# Patient Record
Sex: Male | Born: 1935 | Race: White | Hispanic: No | Smoking: Never smoker
Health system: Southern US, Community
[De-identification: ages and names within clinical notes are randomized; demographics above are authoritative.]

## PROBLEM LIST (undated history)

## (undated) DIAGNOSIS — R739 Hyperglycemia, unspecified: Secondary | ICD-10-CM

## (undated) DIAGNOSIS — J40 Bronchitis, not specified as acute or chronic: Secondary | ICD-10-CM

## (undated) DIAGNOSIS — J189 Pneumonia, unspecified organism: Secondary | ICD-10-CM

## (undated) DIAGNOSIS — N400 Enlarged prostate without lower urinary tract symptoms: Secondary | ICD-10-CM

## (undated) DIAGNOSIS — C4491 Basal cell carcinoma of skin, unspecified: Secondary | ICD-10-CM

## (undated) DIAGNOSIS — M1990 Unspecified osteoarthritis, unspecified site: Secondary | ICD-10-CM

## (undated) DIAGNOSIS — E785 Hyperlipidemia, unspecified: Secondary | ICD-10-CM

## (undated) DIAGNOSIS — I10 Essential (primary) hypertension: Secondary | ICD-10-CM

## (undated) DIAGNOSIS — R000 Tachycardia, unspecified: Secondary | ICD-10-CM

## (undated) HISTORY — DX: Basal cell carcinoma of skin, unspecified: C44.91

## (undated) HISTORY — DX: Hyperglycemia, unspecified: R73.9

## (undated) HISTORY — PX: BASAL CELL CARCINOMA EXCISION: SHX1214

## (undated) HISTORY — DX: Pneumonia, unspecified organism: J18.9

## (undated) HISTORY — DX: Hyperlipidemia, unspecified: E78.5

## (undated) HISTORY — PX: OTHER SURGICAL HISTORY: SHX169

## (undated) HISTORY — DX: Benign prostatic hyperplasia without lower urinary tract symptoms: N40.0

---

## 1970-02-19 HISTORY — PX: HERNIA REPAIR: SHX51

## 2001-02-19 HISTORY — PX: COLONOSCOPY: SHX174

## 2004-06-20 ENCOUNTER — Ambulatory Visit: Payer: Self-pay | Admitting: Internal Medicine

## 2004-07-24 ENCOUNTER — Ambulatory Visit: Payer: Self-pay | Admitting: Internal Medicine

## 2004-08-08 ENCOUNTER — Ambulatory Visit: Payer: Self-pay

## 2004-10-24 ENCOUNTER — Ambulatory Visit: Payer: Self-pay | Admitting: Internal Medicine

## 2004-11-01 ENCOUNTER — Ambulatory Visit: Payer: Self-pay | Admitting: Internal Medicine

## 2005-04-30 ENCOUNTER — Ambulatory Visit: Payer: Self-pay | Admitting: Internal Medicine

## 2005-06-18 ENCOUNTER — Ambulatory Visit: Payer: Self-pay | Admitting: Internal Medicine

## 2005-10-02 ENCOUNTER — Ambulatory Visit: Payer: Self-pay | Admitting: Internal Medicine

## 2005-10-23 ENCOUNTER — Ambulatory Visit: Payer: Self-pay | Admitting: Internal Medicine

## 2005-11-08 ENCOUNTER — Ambulatory Visit: Payer: Self-pay | Admitting: Internal Medicine

## 2006-06-26 DIAGNOSIS — E785 Hyperlipidemia, unspecified: Secondary | ICD-10-CM

## 2006-06-26 DIAGNOSIS — N400 Enlarged prostate without lower urinary tract symptoms: Secondary | ICD-10-CM

## 2006-07-17 ENCOUNTER — Encounter: Payer: Self-pay | Admitting: Internal Medicine

## 2006-07-17 ENCOUNTER — Ambulatory Visit: Payer: Self-pay | Admitting: Internal Medicine

## 2006-08-27 ENCOUNTER — Ambulatory Visit: Payer: Self-pay | Admitting: Internal Medicine

## 2006-09-03 ENCOUNTER — Encounter (INDEPENDENT_AMBULATORY_CARE_PROVIDER_SITE_OTHER): Payer: Self-pay | Admitting: *Deleted

## 2006-09-03 LAB — CONVERTED CEMR LAB
ALT: 24 units/L (ref 0–53)
Cholesterol: 195 mg/dL (ref 0–200)
LDL Cholesterol: 124 mg/dL — ABNORMAL HIGH (ref 0–99)
Potassium: 4.8 meq/L (ref 3.5–5.1)
Total CHOL/HDL Ratio: 5.4
VLDL: 34 mg/dL (ref 0–40)

## 2006-12-05 ENCOUNTER — Ambulatory Visit: Payer: Self-pay | Admitting: Internal Medicine

## 2006-12-16 ENCOUNTER — Ambulatory Visit: Payer: Self-pay | Admitting: Internal Medicine

## 2007-02-20 DIAGNOSIS — J189 Pneumonia, unspecified organism: Secondary | ICD-10-CM

## 2007-02-20 HISTORY — DX: Pneumonia, unspecified organism: J18.9

## 2007-04-16 ENCOUNTER — Encounter: Payer: Self-pay | Admitting: Internal Medicine

## 2007-08-20 ENCOUNTER — Ambulatory Visit: Payer: Self-pay | Admitting: Internal Medicine

## 2007-08-20 DIAGNOSIS — T887XXA Unspecified adverse effect of drug or medicament, initial encounter: Secondary | ICD-10-CM | POA: Insufficient documentation

## 2008-04-01 ENCOUNTER — Ambulatory Visit: Payer: Self-pay | Admitting: Internal Medicine

## 2008-04-01 DIAGNOSIS — R739 Hyperglycemia, unspecified: Secondary | ICD-10-CM

## 2008-04-01 DIAGNOSIS — I351 Nonrheumatic aortic (valve) insufficiency: Secondary | ICD-10-CM

## 2008-04-01 DIAGNOSIS — I10 Essential (primary) hypertension: Secondary | ICD-10-CM | POA: Insufficient documentation

## 2008-04-01 DIAGNOSIS — Z85828 Personal history of other malignant neoplasm of skin: Secondary | ICD-10-CM

## 2008-04-01 LAB — CONVERTED CEMR LAB
HDL goal, serum: 40 mg/dL
LDL Goal: 100 mg/dL

## 2008-04-02 ENCOUNTER — Encounter: Payer: Self-pay | Admitting: Internal Medicine

## 2008-04-12 ENCOUNTER — Ambulatory Visit: Payer: Self-pay | Admitting: Internal Medicine

## 2008-04-25 LAB — CONVERTED CEMR LAB
Albumin: 4.1 g/dL (ref 3.5–5.2)
BUN: 18 mg/dL (ref 6–23)
Basophils Relative: 0.4 % (ref 0.0–3.0)
Cholesterol: 148 mg/dL (ref 0–200)
Eosinophils Absolute: 1.3 10*3/uL — ABNORMAL HIGH (ref 0.0–0.7)
Eosinophils Relative: 16.8 % — ABNORMAL HIGH (ref 0.0–5.0)
HCT: 43.9 % (ref 39.0–52.0)
HDL: 35.8 mg/dL — ABNORMAL LOW (ref >39.0)
Hgb A1c MFr Bld: 5.8 % (ref 4.6–6.0)
MCHC: 35.4 g/dL (ref 30.0–36.0)
MCV: 90.9 fL (ref 78.0–100.0)
Monocytes Absolute: 0.6 10*3/uL (ref 0.1–1.0)
Platelets: 232 10*3/uL (ref 150–400)
Potassium: 5.3 meq/L — ABNORMAL HIGH (ref 3.5–5.1)
RBC: 4.83 M/uL (ref 4.22–5.81)
Total Protein: 7.1 g/dL (ref 6.0–8.3)
WBC: 7.6 10*3/uL (ref 4.5–10.5)

## 2008-04-27 ENCOUNTER — Telehealth (INDEPENDENT_AMBULATORY_CARE_PROVIDER_SITE_OTHER): Payer: Self-pay | Admitting: *Deleted

## 2008-04-27 ENCOUNTER — Encounter (INDEPENDENT_AMBULATORY_CARE_PROVIDER_SITE_OTHER): Payer: Self-pay | Admitting: *Deleted

## 2008-06-23 ENCOUNTER — Ambulatory Visit: Payer: Self-pay | Admitting: Internal Medicine

## 2009-03-15 ENCOUNTER — Ambulatory Visit: Payer: Self-pay | Admitting: Internal Medicine

## 2009-03-29 ENCOUNTER — Ambulatory Visit: Payer: Self-pay | Admitting: Internal Medicine

## 2009-04-04 LAB — CONVERTED CEMR LAB
ALT: 35 units/L (ref 0–53)
AST: 32 units/L (ref 0–37)
Alkaline Phosphatase: 55 units/L (ref 39–117)
Bilirubin, Direct: 0.1 mg/dL (ref 0.0–0.3)
HDL: 42.1 mg/dL (ref >39.00)
TSH: 0.72 microintl units/mL (ref 0.35–5.50)
Total CHOL/HDL Ratio: 4
Total Protein: 7 g/dL (ref 6.0–8.3)

## 2009-04-08 ENCOUNTER — Emergency Department (HOSPITAL_COMMUNITY): Admission: EM | Admit: 2009-04-08 | Discharge: 2009-04-08 | Payer: Self-pay | Admitting: Family Medicine

## 2009-06-07 ENCOUNTER — Ambulatory Visit (HOSPITAL_BASED_OUTPATIENT_CLINIC_OR_DEPARTMENT_OTHER): Admission: RE | Admit: 2009-06-07 | Discharge: 2009-06-07 | Payer: Self-pay | Admitting: Surgery

## 2009-07-20 ENCOUNTER — Ambulatory Visit: Payer: Self-pay | Admitting: Internal Medicine

## 2009-10-18 ENCOUNTER — Ambulatory Visit: Payer: Self-pay | Admitting: Internal Medicine

## 2009-10-18 DIAGNOSIS — J069 Acute upper respiratory infection, unspecified: Secondary | ICD-10-CM | POA: Insufficient documentation

## 2010-03-22 NOTE — Assessment & Plan Note (Signed)
Summary: for med refill//ph   Vital Signs:  Patient profile:   77 year old male Height:      67 inches Weight:      180 pounds Temp:     97.9 degrees F oral Pulse rate:   86 / minute Resp:     18 per minute BP sitting:   120 / 78  (left arm)  Vitals Entered By: Jeremy Johann CMA (March 15, 2009 1:02 PM) CC: f/u bp meds, Hypertension Management, Lipid Management Comments REVIEWED MED LIST, PATIENT AGREED DOSE AND INSTRUCTION CORRECT    CC:  f/u bp meds, Hypertension Management, and Lipid Management.  History of Present Illness: BP @ home 120/75 on average . Rare  flushing with Niaspan , once every 3-4 months , relieved by Benadryl. 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 Some DOE due to deconditioning but not severe. Lipid Management reviewed; HDL never above 42 & it has been as low as 28.  Hypertension History:      He complains of peripheral edema, but denies headache, chest pain, palpitations, dyspnea with exertion, orthopnea, PND, visual symptoms, neurologic problems, syncope, and side effects from treatment.  He notes no problems with any antihypertensive medication side effects.  Occasional RLE edema.        Positive major cardiovascular risk factors include male age 67 years old or older, hyperlipidemia, hypertension, and family history for ischemic heart disease (males less than 75 years old).  Negative major cardiovascular risk factors include no history of diabetes and non-tobacco-user status.        Further assessment for target organ damage reveals no history of ASHD, stroke/TIA, or peripheral vascular disease.    Lipid Management History:      Positive NCEP/ATP III risk factors include male age 69 years old or older, HDL cholesterol less than 40, family history for ischemic heart disease (males less than 13 years old), and hypertension.  Negative NCEP/ATP III risk factors include non-diabetic, non-tobacco-user  status, no ASHD (atherosclerotic heart disease), no prior stroke/TIA, no peripheral vascular disease, and no history of aortic aneurysm.      Allergies (verified): No Known Drug Allergies  Past History:  Past Medical History: chest pain 1992, neg cath ; Aortic Insufficiency; Hyperglycemia Hyperlipidemia Benign prostatic hypertrophy Skin cancer, hx of X 2(face & hand), Dr Margo Aye  Past Surgical History: hydrocoelectomy;colonoscopy negative; local resection of skin cancers as OP ; Inguinal herniorrhaphy  Family History: Father: MI @ 50,HTN,benign essential tremor, AAA rupture ? Mother: HTN,AF Siblings: bro CHF,alcohol abuse  Social History: Retired Never Smoked Alcohol use-no Married  Review of Systems General:  Denies fatigue and weight loss. Eyes:  Denies blurring, double vision, and vision loss-both eyes. ENT:  Denies difficulty swallowing and hoarseness. CV:  Denies leg cramps with exertion, lightheadness, and near fainting. Resp:  Denies cough, shortness of breath, sputum productive, and wheezing. GI:  Denies abdominal pain, bloody stools, dark tarry stools, and indigestion. GU:  Denies discharge, dysuria, and hematuria. MS:  Denies joint pain, joint redness, joint swelling, low back pain, mid back pain, and thoracic pain. Derm:  Denies lesion(s) and rash. Neuro:  Denies brief paralysis, numbness, tingling, and weakness. Endo:  Denies cold intolerance, excessive hunger, excessive thirst, excessive urination, and heat intolerance. Heme:  Denies abnormal bruising and bleeding.  Physical Exam  General:  Appears younger than age,well-nourished,in no acute distress; alert,appropriate and cooperative throughout examination Neck:  No deformities, masses, or tenderness  noted. Lungs:  Normal respiratory effort, chest expands symmetrically. Lungs are clear to auscultation, no crackles or wheezes. Heart:  normal rate, regular rhythm, no gallop, no rub, no JVD, no HJR, and grade   1/6 systolic murmur @ R base.   Abdomen:  Bowel sounds positive,abdomen soft and non-tender without masses, organomegaly or hernias noted. No AAA or bruits Prostate:  Deferred due to age Msk:  No deformity or scoliosis noted of thoracic or lumbar spine.   Pulses:  R and L carotid,radial,dorsalis pedis and posterior tibial pulses are full and equal bilaterally Extremities:  No clubbing, cyanosis, edema. Neurologic:  alert & oriented X3 and DTRs symmetrical and normal.   Skin:  Intact without suspicious lesions or rashes Cervical Nodes:  No lymphadenopathy noted Axillary Nodes:  No palpable lymphadenopathy Psych:  memory intact for recent and remote, normally interactive, and good eye contact.     Impression & Recommendations:  Problem # 1:  HYPERTENSION, ESSENTIAL NOS (ICD-401.9) Controlled His updated medication list for this problem includes:    Lotrel 10-20 Mg Caps (Amlodipine besy-benazepril hcl) .Marland Kitchen... 1 by mouth qd    Propranolol Hcl 40 Mg Tabs (Propranolol hcl) .Marland Kitchen... 1/2 tab bid  Problem # 2:  HYPERLIPIDEMIA (ICD-272.4)  His updated medication list for this problem includes:    Niaspan 1000 Mg Tbcr (Niacin (antihyperlipidemic)) .Marland Kitchen... 1 by mouth qd  Problem # 3:  FASTING HYPERGLYCEMIA (ICD-790.29)  Problem # 4:  SKIN CANCER, HX OF (ICD-V10.83) as per Dr Margo Aye  Problem # 5:  AORTIC INSUFFICIENCY, MILD (ICD-424.1) Clinically stable His updated medication list for this problem includes:    Propranolol Hcl 40 Mg Tabs (Propranolol hcl) .Marland Kitchen... 1/2 tab bid  Complete Medication List: 1)  Niaspan 1000 Mg Tbcr (Niacin (antihyperlipidemic)) .Marland Kitchen.. 1 by mouth qd 2)  Lotrel 10-20 Mg Caps (Amlodipine besy-benazepril hcl) .Marland Kitchen.. 1 by mouth qd 3)  Propranolol Hcl 40 Mg Tabs (Propranolol hcl) .... 1/2 tab bid 4)  Advair Diskus 100-50 Mcg/dose Misc (Fluticasone-salmeterol) .Marland Kitchen.. 1 puff q12 hours  Hypertension Assessment/Plan:      The patient's hypertensive risk group is category B: At least  one risk factor (excluding diabetes) with no target organ damage.  His calculated 10 year risk of coronary heart disease is 11 %.  Today's blood pressure is 120/78.  His blood pressure goal is < 140/90.  Lipid Assessment/Plan:      Based on NCEP/ATP III, the patient's risk factor category is "2 or more risk factors and a calculated 10 year CAD risk of < 20%".  The patient's lipid goals are as follows: Total cholesterol goal is 200; LDL cholesterol goal is 100; HDL cholesterol goal is 40; Triglyceride goal is 150.  His LDL cholesterol goal has been met.  Secondary causes for hyperlipidemia have been ruled out.  He has been counseled on adjunctive measures for lowering his cholesterol and has been provided with dietary instructions.    Patient Instructions: 1)  Please schedule fasting labs: 2)  BUN,creat, K+ prior to visit, ICD-9:401.9 3)  Hepatic Panel prior to visit, ICD-9:995.20 4)  Lipid Panel prior to visit, ICD-9:272.4 5)  TSH prior to visit, ICD-9:272.4 6)  HbgA1C prior to visit, ICD-9:790.29,272.4 Prescriptions: PROPRANOLOL HCL 40 MG  TABS (PROPRANOLOL HCL) 1/2 tab bid  #90 x 3   Entered and Authorized by:   Marga Melnick MD   Signed by:   Marga Melnick MD on 03/15/2009   Method used:   Printed then faxed to .[facility]land KitchenMar[facility] Kitchen  [facility]* (mail-order)             ,          Ph: [phone]       Fax: [phone]   RxID:   [id number] LOTREL 10-20 MG  CAPS (AMLODIPINE BESY-BENAZEPRIL HCL) 1 by mouth qd  #90 Each x 3   Entered and Authorized by:   Marga Melnick MD   Signed by:   Marga Melnick MD on 03/15/2009   Method used:   Printed then faxed to ...       [facility]* (mail-order)             ,          Ph: [phone]       Fax: [phone]   RxID:   [id number] NIASPAN 1000 MG TBCR (NIACIN (ANTIHYPERLIPIDEMIC)) 1 by mouth qd  #90 x 3   Entered and Authorized by:   Marga Melnick MD   Signed by:   Marga Melnick MD on 03/15/2009   Method used:   Printed then  faxed to ...       [facility]* (mail-order)             ,          Ph: [phone]       Fax: [phone]   RxID:   [id number]

## 2010-03-22 NOTE — Assessment & Plan Note (Signed)
Summary: cough/cbs   Vital Signs:  Patient profile:   77 year old male Weight:      177.8 pounds BMI:     27.95 Temp:     98.3 degrees F oral Pulse rate:   76 / minute Resp:     17 per minute BP sitting:   138 / 80  (left arm) Cuff size:   large  Vitals Entered By: Shonna Chock (July 20, 2009 4:32 PM) CC: Cough since Saturday Comments REVIEWED MED LIST, PATIENT AGREED DOSE AND INSTRUCTION CORRECT    CC:  Cough since Saturday.  History of Present Illness: Onset 07/15/2009 as NP cough followed by  mucus, initially clear but dry gray as of 05/31.His 2 daughters, granddaughter & wife have had similar illness. FA:OZHYQM, OTC allergy drug.  Allergies (verified): No Known Drug Allergies  Review of Systems General:  Denies chills, fever, and sweats. ENT:  Complains of nasal congestion; denies ear discharge, earache, sinus pressure, and sore throat; Minor frontal headache; no purulence or facial pain. Resp:  Complains of cough and sputum productive; denies shortness of breath and wheezing. Allergy:  Complains of sneezing; denies itching eyes.  Physical Exam  General:  in no acute distress; alert,appropriate and cooperative throughout examination Ears:  External ear exam shows no significant lesions or deformities.  Otoscopic examination reveals clear canals, tympanic membranes are intact bilaterally without bulging, retraction, inflammation or discharge. Hearing is grossly normal bilaterally. Nose:  External nasal examination shows no deformity or inflammation. Nasal mucosa are pink and moist without lesions or exudates. Mouth:  Oral mucosa and oropharynx without lesions or exudates.  Upper plate; lower partial Lungs:  Normal respiratory effort, chest expands symmetrically. Lungs are clear to auscultation, no crackles or wheezes. BS decreased  Heart:  normal rate, regular rhythm, no gallop, no rub, no JVD, and grade 1 /6 systolic murmur.  S4 Cervical Nodes:  No lymphadenopathy  noted Axillary Nodes:  No palpable lymphadenopathy   Impression & Recommendations:  Problem # 1:  BRONCHITIS-ACUTE (ICD-466.0)  His updated medication list for this problem includes:    Advair Diskus 100-50 Mcg/dose Misc (Fluticasone-salmeterol) .Marland Kitchen... 1 puff q12 hours as needed    Benzonatate 100 Mg Caps (Benzonatate) .Marland Kitchen... 1 every 8 hrs as needed cough    Azithromycin 250 Mg Tabs (Azithromycin) .Marland Kitchen... As per pack  Orders: Prescription Created Electronically [phone])  Complete Medication List: 1)  Niaspan 1000 Mg Tbcr (Niacin (antihyperlipidemic)) .Marland Kitchen.. 1 by mouth qd 2)  Lotrel 10-20 Mg Caps (Amlodipine besy-benazepril hcl) .Marland Kitchen.. 1 by mouth qd 3)  Propranolol Hcl 40 Mg Tabs (Propranolol hcl) .... 1/2 tab bid 4)  Advair Diskus 100-50 Mcg/dose Misc (Fluticasone-salmeterol) .Marland Kitchen.. 1 puff q12 hours as needed 5)  Benzonatate 100 Mg Caps (Benzonatate) .Marland Kitchen.. 1 every 8 hrs as needed cough 6)  Azithromycin 250 Mg Tabs (Azithromycin) .... As per pack  Patient Instructions: 1)  Drink as much fluid as you can tolerate for the next few days. Prescriptions: ADVAIR DISKUS 100-50 MCG/DOSE  MISC (FLUTICASONE-SALMETEROL) 1 puff q12 hours as needed  #1 x 5   Entered and Authorized by:   Marga Melnick MD   Signed by:   Marga Melnick MD on 07/20/2009   Method used:   Faxed to ...       [facility]  [address]  [phone]* (retail)       [address]       [place], [place]  [address]  Ph: [phone] or [phone]       Fax: [phone]   RxID:   [id number] AZITHROMYCIN 250 MG TABS (AZITHROMYCIN) as per pack  #1 x 0   Entered and Authorized by:   Marga Melnick MD   Signed by:   Marga Melnick MD on 07/20/2009   Method used:   Faxed to ...       [facility]  [address]  [phone]* (retail)       [address]       [place], [place]  [address]       Ph: [phone] or [phone]       Fax: [phone]   RxID:   [id number] BENZONATATE  100 MG CAPS (BENZONATATE) 1 every 8 hrs as needed cough  #15 x 0   Entered and Authorized by:   Marga Melnick MD   Signed by:   Marga Melnick MD on 07/20/2009   Method used:   Faxed to ...       [facility]  [address]  [phone]* (retail)       [address]       [place], [place]  [address]       Ph: [phone] or [phone]       Fax: [phone]   RxID:   [id number]

## 2010-03-22 NOTE — Assessment & Plan Note (Signed)
Summary: COUGH/CONGESTED/KN   Vital Signs:  Patient profile:   77 year old male Weight:      176.8 pounds Temp:     98.4 degrees F oral Pulse rate:   56 / minute Resp:     17 per minute BP sitting:   110 / 70  (left arm) Cuff size:   large  Vitals Entered By: Shonna Chock CMA (October 18, 2009 12:17 PM) CC: Cough and congestion since Saturday, URI symptoms   CC:  Cough and congestion since Saturday and URI symptoms.  History of Present Illness:  URI Symptoms      This is a 77 year old man who presents with URI symptoms since 10/14/2009 .  The patient reports nasal congestion and productive cough with traces of dark  sputum, but denies purulent nasal discharge, sore throat, and earache.  Associated symptoms include ? nocturnal  wheezing according to his wife.  The patient denies fever and dyspnea.  The patient also reports frontal  headache.  Risk factors for Strep sinusitis include facial pain.  The patient denies the following risk factors for Strep sinusitis: tooth pain and tender adenopathy.  Rx: Mucinex , Actifed, Neti pot  Current Medications (verified): 1)  Niaspan 1000 Mg Tbcr (Niacin (Antihyperlipidemic)) .Marland Kitchen.. 1 By Mouth Qd 2)  Lotrel 10-20 Mg  Caps (Amlodipine Besy-Benazepril Hcl) .Marland Kitchen.. 1 By Mouth Qd 3)  Propranolol Hcl 40 Mg  Tabs (Propranolol Hcl) .... 1/2 Tab Bid 4)  Advair Diskus 100-50 Mcg/dose  Misc (Fluticasone-Salmeterol) .Marland Kitchen.. 1 Puff Q12 Hours As Needed  Allergies (verified): No Known Drug Allergies  Physical Exam  General:  in no acute distress; alert,appropriate and cooperative throughout examination Eyes:  No corneal or conjunctival inflammation noted. Ears:  External ear exam shows no significant lesions or deformities.  Otoscopic examination reveals clear canals, tympanic membranes are intact bilaterally without bulging, retraction, inflammation or discharge. Hearing is grossly normal bilaterally. Nose:  External nasal examination shows no deformity or  inflammation. Nasal mucosa are pink and moist without lesions or exudates. Mouth:  Oral mucosa and oropharynx without lesions or exudates.  Upper plate & lower partial Lungs:  Normal respiratory effort, chest expands symmetrically. Lungs are clear to auscultation, no crackles or  BS decreased w/o increased WOB Heart:  Normal rate and regular rhythm. S1 and S2 normal without gallop, murmur, click, rub or other extra sounds. Cervical Nodes:  No lymphadenopathy noted Axillary Nodes:  No palpable lymphadenopathy   Impression & Recommendations:  Problem # 1:  URI (ICD-465.9)  The following medications were removed from the medication list:    Benzonatate 100 Mg Caps (Benzonatate) .Marland Kitchen... 1 every 8 hrs as needed cough  Orders: Prescription Created Electronically [phone])  Problem # 2:  BRONCHITIS-ACUTE (ICD-466.0) Assessment: Unchanged  The following medications were removed from the medication list:    Benzonatate 100 Mg Caps (Benzonatate) .Marland Kitchen... 1 every 8 hrs as needed cough    Azithromycin 250 Mg Tabs (Azithromycin) .Marland Kitchen... As per pack His updated medication list for this problem includes:    Advair Diskus 100-50 Mcg/dose Misc (Fluticasone-salmeterol) .Marland Kitchen... 1 puff q12 hours as needed    Amoxicillin 500 Mg Caps (Amoxicillin) .Marland Kitchen... 1 three times a day  Orders: Prescription Created Electronically [phone])  Complete Medication List: 1)  Niaspan 1000 Mg Tbcr (Niacin (antihyperlipidemic)) .Marland Kitchen.. 1 by mouth qd 2)  Lotrel 10-20 Mg Caps (Amlodipine besy-benazepril hcl) .Marland Kitchen.. 1 by mouth qd 3)  Propranolol Hcl 40 Mg Tabs (Propranolol hcl) .... 1/2 tab bid  4)  Advair Diskus 100-50 Mcg/dose Misc (Fluticasone-salmeterol) .Marland Kitchen.. 1 puff q12 hours as needed 5)  Amoxicillin 500 Mg Caps (Amoxicillin) .Marland Kitchen.. 1 three times a day  Patient Instructions: 1)  Stop Actifed.Drink as much fluid as you can tolerate for the next few days. Prescriptions: AMOXICILLIN 500 MG CAPS (AMOXICILLIN) 1 three times a day  #30 x 0    Entered and Authorized by:   Marga Melnick MD   Signed by:   Marga Melnick MD on 10/18/2009   Method used:   Faxed to ...       [facility]  [address]  [phone]* (retail)       [address]       [place], [place]  [address]       Ph: [phone] or [phone]       Fax: [phone]   RxID:   [id number]

## 2010-04-28 ENCOUNTER — Encounter (INDEPENDENT_AMBULATORY_CARE_PROVIDER_SITE_OTHER): Payer: Self-pay | Admitting: *Deleted

## 2010-05-02 NOTE — Letter (Signed)
Summary: Primary Care Appointment Letter  [facility]  [address] [place], [place] [address]   Phone: [phone]  Fax: [phone]    04/28/2010 MRN: [id number]  Christopher Richards [address] [place], [place]  [address]  Dear [name],   Your Primary Care Physician Marga Melnick MD has indicated that:    ___X____it is time to schedule an 30 min appointment for Phyical and Fasting labs before any further refills will be given.    _______you missed your appointment on______ and need to call and          reschedule.    _______you need to have lab work done.    _______you need to schedule an appointment discuss lab or test results.    _______you need to call to reschedule your appointment that is                       scheduled on _________.     Please call our office as soon as possible. Our phone number is [phone]____. Our office is open 8a-5p, Monday through Friday.     Thank you,    [facility]

## 2010-07-22 ENCOUNTER — Other Ambulatory Visit: Payer: Self-pay | Admitting: Internal Medicine

## 2010-08-31 ENCOUNTER — Ambulatory Visit (INDEPENDENT_AMBULATORY_CARE_PROVIDER_SITE_OTHER): Payer: Medicare Other | Admitting: Internal Medicine

## 2010-08-31 ENCOUNTER — Encounter: Payer: Self-pay | Admitting: Internal Medicine

## 2010-08-31 VITALS — BP 130/76 | HR 76 | Temp 98.0°F | Wt 179.6 lb

## 2010-08-31 DIAGNOSIS — M25512 Pain in left shoulder: Secondary | ICD-10-CM

## 2010-08-31 DIAGNOSIS — M25519 Pain in unspecified shoulder: Secondary | ICD-10-CM

## 2010-08-31 MED ORDER — TRAMADOL HCL 50 MG PO TABS: 50.0000 mg | ORAL_TABLET | Freq: Four times a day (QID) | ORAL | Status: AC | PRN

## 2010-08-31 NOTE — Progress Notes (Signed)
Subjective:    Patient ID: Christopher Richards, male    DOB: 09/11/1927, 77 y.o.   MRN: [id number]  HPI SHOULDER  PAIN: Location: L   Onset: 7/5. 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 She's had a severe case of shingles and infracture of her foot. He has had  to provide physical support intermittently.   Severity: up to 8 Pain is described as: sharp  Worse with: with ROM    Better with: no relievers ; BenGay topically and nonsteroidals do not help. Pain radiates to: mid upper arm   Impaired range of motion: decreased elevation & medial rotation History of repetitive motion:  no  History of overuse or hyperextension:  no  History of trauma:  no   Past history of similar problem:  yes, with dislocations in his 33s Symptoms Back Pain:  no  Numbness/tingling:  no  Weakness:  no  Red Flags Fever:  no  Headache/Neck pain :  no  Bowel/bladder dysfunction:  no  In the 1970s he had steroid injections to the left shoulder for bursitis X 2. He also had oral steroids for a week at a time on at least 2 occasions in the 1970s. 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 Alert, appropriate and cooperative throughout exam but uncomfortable. Head: Normocephalic without obvious abnormalities Eyes: No corneal or conjunctival inflammation noted. Pupils equal round reactive to light and accommodation.  Extraocular motion intact.  Neck: No deformities, masses, or tenderness noted. Range of motion is normal, but he has pain with lateral rotation of the neck to the left Thyroid  normal. Lungs: Normal respiratory effort; chest expands symmetrically. Lungs are clear to auscultation without rales, wheezes, or increased work of breathing. Breath sounds are decreased Heart: Normal rate and rhythm. Normal S1 and S2. No gallop, click, or rub. S4 with  slurring; no significant  no murmur.                                                                           Musculoskeletal/extremities: No deformity or scoliosis noted of  the thoracic or lumbar spine; there is asymmetry of the thoracic musculature, the right is more developed on the left.. No clubbing, cyanosis, edema, or deformity noted. Range of motion  : Markedly reduced in the left upper extremity at the shoulder. He has obvious pain with elevation and  medial rotation/abduction and also extension posteriorly. Crepitus is present in the left shoulder .Tone & strength  normal.Joints normal. Nail health  good. Vascular: Carotid, radial artery pulses are full and equal. No bruits present. Neurologic: Alert and oriented x3. Deep tendon reflexes symmetrical and normal.          Skin: Intact without suspicious lesions or rashes. He does have scattered bruising over the forearms Lymph: No cervical, axillary  lymphadenopathy present. Psych: Mood and affect are normal. Normally interactive  Assessment & Plan:  #1 shoulder pain; rule out rotator cuff tear.  Plan: #1 tramadol  every 6 hours as needed  #2 orthopedic referral

## 2010-08-31 NOTE — Patient Instructions (Signed)
Please do not lift more than 10 pounds with the left arm.  You will be called with the appointment to see the orthopedist. My concern is rotator cuff tear.

## 2010-09-15 ENCOUNTER — Ambulatory Visit (INDEPENDENT_AMBULATORY_CARE_PROVIDER_SITE_OTHER): Payer: Medicare Other | Admitting: Internal Medicine

## 2010-09-15 ENCOUNTER — Other Ambulatory Visit: Payer: Self-pay | Admitting: Internal Medicine

## 2010-09-15 VITALS — BP 124/78 | HR 82 | Temp 98.4°F | Wt 179.2 lb

## 2010-09-15 DIAGNOSIS — J209 Acute bronchitis, unspecified: Secondary | ICD-10-CM

## 2010-09-15 DIAGNOSIS — T887XXA Unspecified adverse effect of drug or medicament, initial encounter: Secondary | ICD-10-CM

## 2010-09-15 DIAGNOSIS — I10 Essential (primary) hypertension: Secondary | ICD-10-CM

## 2010-09-15 DIAGNOSIS — E785 Hyperlipidemia, unspecified: Secondary | ICD-10-CM

## 2010-09-15 MED ORDER — HYDROCODONE-HOMATROPINE 5-1.5 MG/5ML PO SYRP: 5.0000 mL | ORAL_SOLUTION | Freq: Four times a day (QID) | ORAL | Status: AC | PRN

## 2010-09-15 MED ORDER — AMLODIPINE BESY-BENAZEPRIL HCL 10-20 MG PO CAPS: 1.0000 | ORAL_CAPSULE | Freq: Every day | ORAL | Status: DC

## 2010-09-15 MED ORDER — PROPRANOLOL HCL 40 MG PO TABS: 40.0000 mg | ORAL_TABLET | Freq: Every day | ORAL | Status: DC

## 2010-09-15 MED ORDER — AZITHROMYCIN 250 MG PO TABS: ORAL_TABLET | ORAL | Status: AC

## 2010-09-15 MED ORDER — NIACIN ER (ANTIHYPERLIPIDEMIC) 1000 MG PO TBCR: 1000.0000 mg | EXTENDED_RELEASE_TABLET | Freq: Every day | ORAL | Status: DC

## 2010-09-15 NOTE — Progress Notes (Signed)
  Subjective:    Patient ID: Christopher Richards, male    DOB: 10/02/1927, 77 y.o.   MRN: [id number]  HPI Cough Onset:7 days ago Extrinsic symptoms:itchy eyes, sneezing:no  Infectious symptoms :fever, purulent secretions :yellow sputum as of 7/22 Chest symptoms:pleuritic  pain,  Hemoptysis,dyspnea,wheezing:minor wheezing @ night GI symptoms: Dyspepsia, reflux:no Occupational/environmental exposures:no Smoking:never ACE inhibitor:yes Treatment/efficacy:HBP Coricidin with some improvement in head congestion Past medical history/family history pulmonary disease: maternal FH asthma    Review of Systems he denies nasal purulence; facial pain; anosmia; fatigue;  headache; halitosis; earache and dental pain.     Objective:   Physical Exam General appearance is of good health and nourishment; no acute distress or increased work of breathing is present.  No  lymphadenopathy about the head, neck, or axilla noted.   Eyes: No conjunctival inflammation or lid edema is present. There is no scleral icterus.  Ears:  External ear exam shows no significant lesions or deformities.  Otoscopic examination reveals clear canals, tympanic membranes are intact bilaterally without bulging, retraction, inflammation or discharge. TMs slightly dull  Nose:  External nasal examination shows no deformity or inflammation. Nasal mucosa are pink and moist without lesions or exudates. No septal dislocation or dislocation.No obstruction to airflow.   Oral exam: Upper plate & lower partial; lips and gums are healthy appearing.There is no oropharyngeal erythema or exudate noted.  Hoarse  Neck:  No deformities, thyromegaly, masses, or tenderness noted.     Heart:  Normal rate and regular rhythm. S1 and S2 normal without gallop, murmur, click, rub S4 with slurring  Lungs:Chest clear to auscultation; no wheezes, rhonchi,rales ,or rubs present.No increased work of breathing. BS slightly decreased   Extremities:  No cyanosis,  edema, or clubbing  noted    Skin: Warm & dry w/o jaundice         Assessment & Plan:   #1 bronchitis, acute. No clinical reactive airways disease or rhinosinusitis at this time.  Plan: See orders and patient recommendations

## 2010-09-15 NOTE — Patient Instructions (Signed)
Plain Mucinex for thick secretions ;force NON dairy fluids for next 48 hrs. Use a Neti pot daily as needed for sinus congestion 

## 2010-09-15 NOTE — Progress Notes (Signed)
Addended by: Doristine Devoid on: 09/15/2010 11:57 AM   Modules accepted: Orders

## 2010-09-18 ENCOUNTER — Other Ambulatory Visit (INDEPENDENT_AMBULATORY_CARE_PROVIDER_SITE_OTHER): Payer: Medicare Other

## 2010-09-18 DIAGNOSIS — T887XXA Unspecified adverse effect of drug or medicament, initial encounter: Secondary | ICD-10-CM

## 2010-09-18 DIAGNOSIS — E785 Hyperlipidemia, unspecified: Secondary | ICD-10-CM

## 2010-09-18 DIAGNOSIS — I10 Essential (primary) hypertension: Secondary | ICD-10-CM

## 2010-09-18 LAB — BASIC METABOLIC PANEL
CO2: 26 mEq/L (ref 19–32)
Calcium: 9.5 mg/dL (ref 8.4–10.5)
Chloride: 105 mEq/L (ref 96–112)
Glucose, Bld: 130 mg/dL — ABNORMAL HIGH (ref 70–99)
Potassium: 4.5 mEq/L (ref 3.5–5.1)
Sodium: 141 mEq/L (ref 135–145)

## 2010-09-18 LAB — HEPATIC FUNCTION PANEL
ALT: 19 U/L (ref 0–53)
AST: 23 U/L (ref 0–37)
Alkaline Phosphatase: 57 U/L (ref 39–117)
Bilirubin, Direct: 0 mg/dL (ref 0.0–0.3)
Total Bilirubin: 0.8 mg/dL (ref 0.3–1.2)

## 2010-09-18 LAB — LIPID PANEL
HDL: 46.7 mg/dL (ref >39.00)
LDL Cholesterol: 103 mg/dL — ABNORMAL HIGH (ref 0–99)
Total CHOL/HDL Ratio: 4
Triglycerides: 71 mg/dL (ref 0.0–149.0)
VLDL: 14.2 mg/dL (ref 0.0–40.0)

## 2010-09-18 NOTE — Progress Notes (Signed)
12  

## 2010-09-18 NOTE — Progress Notes (Signed)
Labs only

## 2010-10-03 ENCOUNTER — Other Ambulatory Visit: Payer: Self-pay | Admitting: Internal Medicine

## 2010-10-03 MED ORDER — FLUTICASONE-SALMETEROL 100-50 MCG/DOSE IN AEPB: 1.0000 | INHALATION_SPRAY | Freq: Two times a day (BID) | RESPIRATORY_TRACT | Status: DC

## 2010-11-23 ENCOUNTER — Encounter: Payer: Self-pay | Admitting: Internal Medicine

## 2010-11-23 ENCOUNTER — Ambulatory Visit (INDEPENDENT_AMBULATORY_CARE_PROVIDER_SITE_OTHER): Payer: Medicare Other | Admitting: Internal Medicine

## 2010-11-23 DIAGNOSIS — J209 Acute bronchitis, unspecified: Secondary | ICD-10-CM

## 2010-11-23 DIAGNOSIS — J069 Acute upper respiratory infection, unspecified: Secondary | ICD-10-CM

## 2010-11-23 MED ORDER — SULFAMETHOXAZOLE-TMP DS 800-160 MG PO TABS: 1.0000 | ORAL_TABLET | Freq: Two times a day (BID) | ORAL | Status: AC

## 2010-11-23 NOTE — Patient Instructions (Signed)
Plain Mucinex for thick secretions ;force NON dairy fluids for next 48 hrs. Use a Neti pot daily as needed for sinus congestion 

## 2010-11-23 NOTE — Progress Notes (Signed)
  Subjective:    Patient ID: Christopher Richards, male    DOB: [birth year], 77 y.o.   MRN: [id number]  HPI Respiratory tract infection Onset/symptoms:as cough 9 days ago Exposures (illness/environmental/extrinsic):wife ill Progression of symptoms:to head& chest congestion, cough worse Treatments/response:Actifed  Present symptoms: Fever/chills/sweats:no Frontal headache:yes Facial pain:yes Nasal purulence:no Lymphadenopathy:no Wheezing/shortness of breath:only low grade wheezing Cough/sputum/hemoptysis:yellow sputum Smoking history:never PMH: no asthma           Review of Systems     Objective:   Physical Exam General appearance is of good health and nourishment; no acute distress or increased work of breathing is present.  No  lymphadenopathy about the head, neck, or axilla noted.   Eyes: No conjunctival inflammation or lid edema is present.   Ears:  External ear exam shows no significant lesions or deformities.  Otoscopic examination reveals clear canals, tympanic membranes are intact bilaterally without bulging, retraction, inflammation or discharge.L TM dull  Nose:  External nasal examination shows no deformity or inflammation. Nasal mucosa are pink and moist without lesions or exudates. No septal dislocation or dislocation.No obstruction to airflow.   Oral exam: Denture upper; lower partial.There is no oropharyngeal erythema or exudate noted.     Heart:  Normal rate and regular rhythm. S1 and S2 normal without gallop, murmur, click, rub.S4 Lungs:Chest clear to auscultation; no wheezes, rhonchi,rales ,or rubs present.No increased work of breathing.  Decreased BS  Extremities:  No cyanosis, edema, or clubbing  noted    Skin: Warm & dry       Assessment & Plan:  #1 bronchitis, acute  #2 upper respiratory infection Plan: See orders and recommendations

## 2011-04-02 ENCOUNTER — Other Ambulatory Visit: Payer: Self-pay | Admitting: Internal Medicine

## 2011-04-10 ENCOUNTER — Telehealth: Payer: Self-pay | Admitting: Internal Medicine

## 2011-04-10 MED ORDER — PROPRANOLOL HCL 40 MG PO TABS: 40.0000 mg | ORAL_TABLET | Freq: Every day | ORAL | Status: DC

## 2011-04-10 NOTE — Telephone Encounter (Signed)
Patient called stating he is in [facility] for a week and forgot to bring his propranolol. Would like 15 pills called into [facility] in [facility] Ph [phone], fax [phone].

## 2011-04-10 NOTE — Telephone Encounter (Signed)
Please refill the medication ; 1 month's worth if same coat as 15 pills

## 2011-04-10 NOTE — Telephone Encounter (Signed)
Left Pt detail message Rx sent in to pharmacy. 

## 2011-04-16 DIAGNOSIS — J209 Acute bronchitis, unspecified: Secondary | ICD-10-CM | POA: Diagnosis not present

## 2011-06-11 ENCOUNTER — Ambulatory Visit (INDEPENDENT_AMBULATORY_CARE_PROVIDER_SITE_OTHER): Payer: Medicare Other | Admitting: Internal Medicine

## 2011-06-11 ENCOUNTER — Ambulatory Visit (INDEPENDENT_AMBULATORY_CARE_PROVIDER_SITE_OTHER)
Admission: RE | Admit: 2011-06-11 | Discharge: 2011-06-11 | Disposition: A | Discharge status: Home / Self Care | Payer: Medicare Other | Source: Ambulatory Visit | Attending: Internal Medicine | Admitting: Internal Medicine

## 2011-06-11 ENCOUNTER — Encounter: Payer: Self-pay | Admitting: Internal Medicine

## 2011-06-11 VITALS — BP 120/78 | HR 73 | Temp 98.1°F | Wt 184.6 lb

## 2011-06-11 DIAGNOSIS — R609 Edema, unspecified: Secondary | ICD-10-CM | POA: Diagnosis not present

## 2011-06-11 DIAGNOSIS — I10 Essential (primary) hypertension: Secondary | ICD-10-CM | POA: Diagnosis not present

## 2011-06-11 DIAGNOSIS — J209 Acute bronchitis, unspecified: Secondary | ICD-10-CM

## 2011-06-11 DIAGNOSIS — R600 Localized edema: Secondary | ICD-10-CM

## 2011-06-11 DIAGNOSIS — R05 Cough: Secondary | ICD-10-CM | POA: Diagnosis not present

## 2011-06-11 DIAGNOSIS — K449 Diaphragmatic hernia without obstruction or gangrene: Secondary | ICD-10-CM | POA: Diagnosis not present

## 2011-06-11 MED ORDER — FUROSEMIDE 40 MG PO TABS: 40.0000 mg | ORAL_TABLET | Freq: Every day | ORAL | Status: DC

## 2011-06-11 MED ORDER — FUROSEMIDE 40 MG PO TABS: ORAL_TABLET | ORAL | Status: DC

## 2011-06-11 MED ORDER — LOSARTAN POTASSIUM 100 MG PO TABS: 100.0000 mg | ORAL_TABLET | Freq: Every day | ORAL | Status: DC

## 2011-06-11 MED ORDER — PREDNISONE 20 MG PO TABS: 20.0000 mg | ORAL_TABLET | Freq: Two times a day (BID) | ORAL | Status: AC

## 2011-06-11 MED ORDER — AZITHROMYCIN 250 MG PO TABS: ORAL_TABLET | ORAL | Status: AC

## 2011-06-11 NOTE — Progress Notes (Signed)
Subjective:    Patient ID: Christopher Richards, male    DOB: 24-Jul-1927, 78 y.o.   MRN: [id number]  HPI He is here for followup of a cough which has been present to some degree since October 2012. He was treated with Septra DS at that time with a 60% improvement.  There was exacerbation of symptoms while at the beach in February of this year. He received 10 days of Ceftin with minimal to no improvement. He continued to have thick secretions intermittently 2-3 times per week. He states that the cough would be nonproductive 1/2 the time.  There was exacerbation of symptoms 2 weeks ago with production of yellow sputum as of April 12.  5 days ago he noted some wheezing with expiration.  He's had some allergic symptoms with sneezing  but dry eyes. He's been using over-the-counter medication with benefit. He states that he does get bronchitis every fall.  He did have frontal headache last week with some postnasal drainage.  He denies fever or, chills, sweats, unexplained weight loss, pleurisy, hemoptysis or purulent nasal secretions.  He is on amlodipine/10 as  well as propranolol.  Past medical history/family history/social history were all reviewed and updated. Pertinent data: His father may have had black lung; he was a Environmental consultant. Mr. Dill has never smoked and has no past history of asthma.      Review of Systems He denies any significant reflux symptoms.  He's had  edema bilaterally for 3-4 weeks. He denies chest pain, palpitations, exertional dyspnea, paroxysmal nocturnal dyspnea, or persistent claudication. He denies increased salt intake. He is not on a diuretic.  He denies was  active pain, swelling or redness in the joints at this time.         Objective:   Physical Exam General appearance:well nourished; no acute distress or increased work of breathing is present.  No  lymphadenopathy about the head, neck, or axilla noted.   Eyes: No conjunctival inflammation or lid edema  is present. There is no scleral icterus.  Ears:  External ear exam shows no significant lesions or deformities.  Otoscopic examination reveals clear canals, tympanic membranes are intact bilaterally without bulging, retraction, inflammation or discharge.  Nose:  External nasal examination shows no deformity or inflammation. Nasal mucosa are pink and moist without lesions or exudates. No septal dislocation or deviation.No obstruction to airflow.   Oral exam: Upper plate & lower partial; lips and gums are healthy appearing.There is no oropharyngeal erythema or exudate noted. Very hoarse  Neck:  No deformities, thyromegaly, masses, or tenderness noted. No neck vein distention at 15   Heart:  Normal rate and regular rhythm. S1 and S2 normal without gallop, click, rub or other extra sounds. S4 with Grade 1/6 short systolic murmur   Lungs: Low-grade rhonchi diffusely.No increased work of breathing.   Abdomen: No organomegaly, masses, or tenderness. No hepatojugular reflux   Extremities:  No cyanosis  or clubbing  noted . 1+ pitting edema in ankles  Neuro: Alert and oriented x3. Bilateral hand tremor   Skin: Warm & dry w/o jaundice           Assessment & Plan:  #1 bronchitis with some purulence of sputum; reactive airways disease appears to be present. This could be complicated by the ACE inhibitor as well as a nonselective beta blocker therapy.  #2 edema; this could be related to amlodipine  #3 tremor; subjective response to a nonselective beta blocker.  Plan: ACE-I/amlodipine combination will be  changed to losartan 100 mg daily with furosemide 40 mg one half pill daily.  He'll be seen in followup to assess reactive airway status to determine whether the beta blocker should be continued  Short course of prednisone and Z-Pak

## 2011-06-11 NOTE — Patient Instructions (Signed)
Blood Pressure Goal  Ideally is an AVERAGE < 135/85. This AVERAGE should be calculated from @ least 5-7 BP readings taken @ different times of day on different days of week. You should not respond to isolated BP readings , but rather the AVERAGE for that week Order for x-rays entered into  the computer; these will be performed at [address] [facility]. across from [facility]. No appointment is necessary.   Please schedule followup appointment in 10-14 days. Please bring all actual pill bottles and supplements.

## 2011-06-22 ENCOUNTER — Ambulatory Visit (INDEPENDENT_AMBULATORY_CARE_PROVIDER_SITE_OTHER): Payer: Medicare Other | Admitting: Internal Medicine

## 2011-06-22 ENCOUNTER — Encounter: Payer: Self-pay | Admitting: Internal Medicine

## 2011-06-22 VITALS — BP 114/76 | HR 72 | Temp 98.0°F | Wt 184.0 lb

## 2011-06-22 DIAGNOSIS — T887XXA Unspecified adverse effect of drug or medicament, initial encounter: Secondary | ICD-10-CM

## 2011-06-22 DIAGNOSIS — I10 Essential (primary) hypertension: Secondary | ICD-10-CM | POA: Diagnosis not present

## 2011-06-22 MED ORDER — FLUTICASONE-SALMETEROL 100-50 MCG/DOSE IN AEPB: INHALATION_SPRAY | RESPIRATORY_TRACT | Status: DC

## 2011-06-22 NOTE — Assessment & Plan Note (Signed)
Excellent control; no change indicated blood pressure goals discussed

## 2011-06-22 NOTE — Patient Instructions (Addendum)
Blood Pressure Goal  Ideally is an AVERAGE < 135/85. This AVERAGE should be calculated from @ least 5-7 BP readings taken @ different times of day on different days of week. You should not respond to isolated BP readings , but rather the AVERAGE for that week  The best exercises for the low back include freestyle xswimming, stretch aerobics, and yoga.

## 2011-06-22 NOTE — Progress Notes (Signed)
  Subjective:    Patient ID: Christopher Richards, male    DOB: [birth year], 78 y.o.   MRN: [id number]  HPI  His respiratory symptoms have also resolved with medications. He states he is not cough for 3 days. Off amlodipine his edema has resolved as noted. With a whole  furosemide he was having some hypotension and urinary frequency. On half a pill; he is essentially asymptomatic. Blood pressure is now well controlled. In the last 5 days has ranged from 117/64 to a high of 132/82.    Review of Systems "I feel the best I've felt in quite a while"; he denies any active symptoms     Objective:   Physical Exam he appears healthy and well-nourished  Chest is clear with no rhonchi or wheezing.  He has a grade 1/2-1 systolic murmur at the base. A rare premature beat is present.  He has no cyanosis, clubbing or edema  All pulses are intact; no bruits are present  He has a fine tremor of his hands.  There is some asymmetry of the posterior thoracic musculature suggesting occult scoliosis.         Assessment & Plan:

## 2011-06-27 ENCOUNTER — Telehealth: Payer: Self-pay | Admitting: *Deleted

## 2011-06-27 NOTE — Telephone Encounter (Signed)
Pt states that [facility] called him and said that they cannot refill Rx [name not given] until nurse calls w/needed clarification [not stated] on refill sent 05.03.13. [facility] call back is [phone]

## 2011-06-28 NOTE — Telephone Encounter (Signed)
[facility] verbally  Inform of sig and med list updated.

## 2011-06-28 NOTE — Telephone Encounter (Signed)
Advair is one inhalation  every 12 hours prn ; gargle and spit after use

## 2011-06-28 NOTE — Telephone Encounter (Signed)
Spoke with [facility] need to clarify sig for advair.Marland KitchenPlease advise

## 2011-07-01 ENCOUNTER — Other Ambulatory Visit: Payer: Self-pay | Admitting: Internal Medicine

## 2011-07-03 NOTE — Telephone Encounter (Signed)
I spoke with patient, this requested med is no longer on his medication list, patient was changed to Losartan. Patient confirmed he is taking Losartan and not amlodipine-benazepril. Patient states this was sent to [name] in error.

## 2011-11-06 DIAGNOSIS — H353190 Nonexudative age-related macular degeneration, unspecified eye, stage unspecified: Secondary | ICD-10-CM | POA: Diagnosis not present

## 2011-11-06 DIAGNOSIS — H40019 Open angle with borderline findings, low risk, unspecified eye: Secondary | ICD-10-CM | POA: Diagnosis not present

## 2011-11-21 ENCOUNTER — Ambulatory Visit (INDEPENDENT_AMBULATORY_CARE_PROVIDER_SITE_OTHER): Payer: Medicare Other | Admitting: Internal Medicine

## 2011-11-21 ENCOUNTER — Encounter: Payer: Self-pay | Admitting: Internal Medicine

## 2011-11-21 VITALS — BP 138/84 | HR 69 | Temp 97.7°F | Resp 12 | Ht 67.03 in | Wt 179.4 lb

## 2011-11-21 DIAGNOSIS — E785 Hyperlipidemia, unspecified: Secondary | ICD-10-CM

## 2011-11-21 DIAGNOSIS — R7309 Other abnormal glucose: Secondary | ICD-10-CM

## 2011-11-21 DIAGNOSIS — Z0000 Encounter for general adult medical examination without abnormal findings: Secondary | ICD-10-CM

## 2011-11-21 DIAGNOSIS — Z23 Encounter for immunization: Secondary | ICD-10-CM

## 2011-11-21 DIAGNOSIS — I10 Essential (primary) hypertension: Secondary | ICD-10-CM | POA: Diagnosis not present

## 2011-11-21 LAB — LIPID PANEL: HDL: 45.4 mg/dL (ref >39.00)

## 2011-11-21 LAB — HEPATIC FUNCTION PANEL
AST: 29 U/L (ref 0–37)
Albumin: 4.2 g/dL (ref 3.5–5.2)

## 2011-11-21 LAB — HEMOGLOBIN A1C: Hgb A1c MFr Bld: 6.2 % (ref 4.6–6.5)

## 2011-11-21 LAB — TSH: TSH: 1.42 u[IU]/mL (ref 0.35–5.50)

## 2011-11-21 LAB — BASIC METABOLIC PANEL
BUN: 15 mg/dL (ref 6–23)
Calcium: 9.5 mg/dL (ref 8.4–10.5)
Creatinine, Ser: 1.1 mg/dL (ref 0.4–1.5)
GFR: 65.36 mL/min (ref >60.00)
Glucose, Bld: 129 mg/dL — ABNORMAL HIGH (ref 70–99)
Potassium: 4.9 mEq/L (ref 3.5–5.1)

## 2011-11-21 MED ORDER — HYDROCHLOROTHIAZIDE 12.5 MG PO CAPS: ORAL_CAPSULE | ORAL | Status: DC

## 2011-11-21 NOTE — Progress Notes (Signed)
Subjective:    Patient ID: Christopher Richards, male    DOB: [birth year], 78 y.o.   MRN: [id number]  HPI Medicare Wellness Visit:  The following psychosocial & medical history were reviewed as required by Medicare.   Social history: caffeine: 3 cups of coffee/day , alcohol: no  ,  tobacco use :never  & exercise : walks 30-45 minutes/day.   Home & personal  safety / fall risk: no issues, activities of daily living: no limitations , seatbelt use : yes , and smoke alarm employment : yes.  Power of Attorney/Living Will status : yes  Vision ( as recorded per Nurse) & Hearing  evaluation : Cataract surgery being evaluated;he has hearing aids; wears them "1/2 time". Orientation :oriented X 3 , memory & recall : ,  math testing: good,and mood & affect : normal . Depression / anxiety: denied Travel history : 2003 [place] , immunization status :Shingles needed , transfusion history:  no, and preventive health surveillance ( colonoscopies, BMD , etc as per protocol/ SOC): aged out of colonoscopy survelliance, Dental care: every 6 mos . Chart reviewed &  Updated. Active issues reviewed & addressed.       Review of Systems HYPERTENSION: Disease Monitoring: Blood pressure range-130-146/70-86 Chest pain, palpitations- no       Dyspnea-only with exercise with waist flexion(PMH of HH) Medications: Compliance- yes Lightheadedness,Syncope-no    Edema- not off Amlodipine  FASTING HYPERGLYCEMIA, PMH of:  Disease Monitoring: Blood Sugar ranges-no monitor Polyuria/phagia/dipsia- frequency with Lasix; nocturia 2-4X, usually X 2      Visual problems- no  HYPERLIPIDEMIA: Disease Monitoring: See symptoms for Hypertension Medications: Compliance- on Niaspan Abd pain, bowel changes- no  Muscle aches- no except with  [place]alking in mountains        Objective:   Physical Exam Gen.: Healthy and well-nourished in appearance. Alert, appropriate and cooperative throughout exam. Head: Normocephalic without  obvious abnormalities;  pattern alopecia  Eyes: No corneal or conjunctival inflammation noted. Pupils equal round reactive to light and accommodation.Slight decrease in light reflex OD w/o distinct cataract. Fundal exam is benign without hemorrhages, exudate, papilledema. Extraocular motion intact. Vision grossly normal with lenses. Ears: External  ear exam reveals no significant lesions or deformities. Canals clear .TMs slightly scarred. Hearing is grossly decreased  bilaterally. Nose: External nasal exam reveals no deformity or inflammation. Nasal mucosa are pink and moist. No lesions or exudates noted.   Mouth: Oral mucosa and oropharynx reveal no lesions or exudates. Upper plate & lower partial.Hoarse Neck: No deformities, masses, or tenderness noted. Range of motion & Thyroid normal. Lungs: Normal respiratory effort; chest expands symmetrically. Lungs are clear to auscultation without rales, wheezes, or increased work of breathing. Heart: Normal rate and rhythm. Normal S1 and S2. No gallop, click, or rub. Grade 1/2 R base  murmur. Abdomen: Bowel sounds normal; abdomen soft and nontender. No masses, organomegaly or hernias noted. Genitalia/ DRE: Genitalia normal except for bilateral varices. Prostate is normal without enlargement, asymmetry, nodularity, or induration.                                                    Musculoskeletal/extremities: There is some asymmetry of the posterior thoracic musculature suggesting occult scoliosis.  No clubbing, cyanosis, edema, or deformity noted. Range of motion  normal .Tone & strength  normal.Joints normal. Nail health  good. Vascular: Carotid, radial artery, dorsalis pedis and  posterior tibial pulses are full and equal. No bruits present. Neurologic: Alert and oriented x3. Deep tendon reflexes symmetrical and normal.          Skin: Intact without suspicious lesions or rashes. Lymph: No cervical, axillary, or inguinal lymphadenopathy present. Psych: Mood  and affect are normal. Normally interactive                                                                                        Assessment & Plan:  #1 Medicare Wellness Exam; criteria met ; data entered #2 Problem List reviewed ; Assessment/ Recommendations made  EKG reveals low voltage. There is a tall QRS in V2 but no associated ischemic changes. Despite the history of aortic insufficiency there is no left ventricular hypertrophy. Plan: see Orders

## 2011-11-21 NOTE — Patient Instructions (Addendum)
Preventive Health Care: Exercise at least 30-45 minutes a day,  3-4 days a week.  Eat a low-fat diet with lots of fruits and vegetables, up to 7-9 servings per day.  Consume less than 40 grams of sugar per day from foods & drinks with High Fructose Corn Sugar as #1,2,3 or # 4 on label. Eye Doctor - have an eye exam @ least annually.                                                       If you activate My Chart; the results can be released to you as soon as they populate from the lab. If you choose not to use this program; the labs have to be reviewed, copied & mailed   causing a delay in getting the results to you.

## 2011-12-05 DIAGNOSIS — H2510 Age-related nuclear cataract, unspecified eye: Secondary | ICD-10-CM | POA: Diagnosis not present

## 2011-12-06 ENCOUNTER — Other Ambulatory Visit: Payer: Self-pay | Admitting: Internal Medicine

## 2011-12-25 DIAGNOSIS — H2589 Other age-related cataract: Secondary | ICD-10-CM | POA: Diagnosis not present

## 2011-12-25 DIAGNOSIS — H2510 Age-related nuclear cataract, unspecified eye: Secondary | ICD-10-CM | POA: Diagnosis not present

## 2012-01-01 DIAGNOSIS — H2510 Age-related nuclear cataract, unspecified eye: Secondary | ICD-10-CM | POA: Diagnosis not present

## 2012-01-01 DIAGNOSIS — H269 Unspecified cataract: Secondary | ICD-10-CM | POA: Diagnosis not present

## 2012-03-13 DIAGNOSIS — D235 Other benign neoplasm of skin of trunk: Secondary | ICD-10-CM | POA: Diagnosis not present

## 2012-03-13 DIAGNOSIS — Z85828 Personal history of other malignant neoplasm of skin: Secondary | ICD-10-CM | POA: Diagnosis not present

## 2012-03-13 DIAGNOSIS — L570 Actinic keratosis: Secondary | ICD-10-CM | POA: Diagnosis not present

## 2012-03-13 DIAGNOSIS — L821 Other seborrheic keratosis: Secondary | ICD-10-CM | POA: Diagnosis not present

## 2012-05-01 ENCOUNTER — Encounter: Payer: Self-pay | Admitting: Internal Medicine

## 2012-05-01 ENCOUNTER — Ambulatory Visit (INDEPENDENT_AMBULATORY_CARE_PROVIDER_SITE_OTHER): Payer: Medicare Other | Admitting: Internal Medicine

## 2012-05-01 VITALS — BP 130/84 | HR 82 | Wt 178.0 lb

## 2012-05-01 DIAGNOSIS — I10 Essential (primary) hypertension: Secondary | ICD-10-CM

## 2012-05-01 DIAGNOSIS — R351 Nocturia: Secondary | ICD-10-CM

## 2012-05-01 DIAGNOSIS — E785 Hyperlipidemia, unspecified: Secondary | ICD-10-CM

## 2012-05-01 DIAGNOSIS — R7309 Other abnormal glucose: Secondary | ICD-10-CM | POA: Diagnosis not present

## 2012-05-01 LAB — MICROALBUMIN / CREATININE URINE RATIO
Creatinine,U: 56.3 mg/dL
Microalb Creat Ratio: 5.9 mg/g (ref 0.0–30.0)

## 2012-05-01 MED ORDER — LOSARTAN POTASSIUM 100 MG PO TABS: 100.0000 mg | ORAL_TABLET | Freq: Every day | ORAL | Status: DC

## 2012-05-01 MED ORDER — PROPRANOLOL HCL 40 MG PO TABS: ORAL_TABLET | ORAL | Status: DC

## 2012-05-01 NOTE — Assessment & Plan Note (Signed)
A1c microalbumin 

## 2012-05-01 NOTE — Assessment & Plan Note (Signed)
I've asked him to bring his blood pressure cuff to all visits; values at these appointments are much better than those reported

## 2012-05-01 NOTE — Assessment & Plan Note (Signed)
Focus will be on increasing exercise and heart healthy diet rather than adding a statin

## 2012-05-01 NOTE — Patient Instructions (Addendum)

## 2012-05-01 NOTE — Progress Notes (Signed)
  Subjective:    Patient ID: Christopher Richards, male    DOB: 26-Aug-1927, 79 y.o.   MRN: [id number]  HPI The patient is here for followup of diabetes hyperlipidemia and hypertension.  Blood pressure range  average  140's/90's. There is medical compliance with antihypertensive medications  The most recent lipids reveal LDL115 ; HDL 45.40 ; and triglycerides 98.0 .  The most recent A1c was 6.2 , which correlates to an average sugar of  132, and long-term risk of 24% . Fasting blood sugars & two-hour postprandial glucoses not monitored . No hypoglycemia reported Increase in nocturia/ 3-4 times. Last ophthalmologic examination 04/10/12 revealed no retinopathy. Bilateral cataracts extracted 01/2012. No active podiatry assessment on record. Diet is heart healthy. Exercise  irregular 6X/ week as walking CVE for 30-45 minutes.            Review of Systems Constitutional: No  significant weight change;  excess fatigue Eyes: No  blurred vision;  double vision ; loss of vision Cardiovascular: no chest pain ;palpitations; racing; irregular rhythm ;syncope; claudication ; edema  Respiratory: No exertional dyspnea;  paroxysmal nocturnal dyspnea Genitourinary: No dysuria; hematuria ; pyuria; frequency;  Incontinence. Musculoskeletal: No myalgias or muscle cramping  Dermatologic: No non healing  Lesions; change in color or temperature of skin Neurologic: No  limb weakness;  numbness or tingling;  burning Endocrine: No change in hair, skin, nails. No excessive thirst; excessive hunger;  excessive urination      Objective:   Physical Exam Gen.: Healthy and well-nourished in appearance. Alert, appropriate and cooperative throughout exam. Appears younger than stated age  Head: Normocephalic without obvious abnormalities;   Eyes: No corneal or conjunctival inflammation noted. Pupils equal round reactive to light and accommodation. Fundal exam is benign without hemorrhages, exudate, papilledema.  Extraocular motion intact.   Mouth: Oral mucosa and oropharynx reveal no lesions or exudates. Upper plate & lower partial. Hoarse Neck: No deformities, masses, or tenderness noted.  Thyroid normal. Lungs: Normal respiratory effort; chest expands symmetrically. Lungs are clear to auscultation without rales, wheezes, or increased work of breathing. Heart: Normal rate and rhythm. Normal S1 and S2. No gallop, click, or rub. Grade 1/6 systolic murmur. Occasional prematures Abdomen: Bowel sounds normal; abdomen soft and nontender. No masses, organomegaly or hernias noted. Musculoskeletal/extremities:No clubbing, cyanosis, edema, or significant extremity  deformity noted. Range of motion normal .Tone & strength  Normal. Joints normal. Nail health good. Vascular: Carotid, radial artery, dorsalis pedis and  posterior tibial pulses are full and equal. No bruits present. Neurologic: Alert and oriented x3. Deep tendon reflexes symmetrical and normal.      Skin: Intact without suspicious lesions or rashes. Lymph: No cervical, axillary lymphadenopathy present. Psych: Mood and affect are normal. Normally interactive                                                                                        Assessment & Plan:  #1 see problem list with updates  #2 nocturia; prostate was normal less than 6 months ago.  Plan: See orders and recommendations

## 2012-05-05 ENCOUNTER — Encounter: Payer: Self-pay | Admitting: *Deleted

## 2012-06-02 ENCOUNTER — Other Ambulatory Visit: Payer: Self-pay | Admitting: Family Medicine

## 2012-11-19 DIAGNOSIS — L570 Actinic keratosis: Secondary | ICD-10-CM | POA: Diagnosis not present

## 2012-11-19 DIAGNOSIS — H00019 Hordeolum externum unspecified eye, unspecified eyelid: Secondary | ICD-10-CM | POA: Diagnosis not present

## 2012-12-30 ENCOUNTER — Ambulatory Visit (INDEPENDENT_AMBULATORY_CARE_PROVIDER_SITE_OTHER): Payer: Medicare Other | Admitting: Internal Medicine

## 2012-12-30 ENCOUNTER — Encounter: Payer: Self-pay | Admitting: Internal Medicine

## 2012-12-30 VITALS — BP 177/87 | HR 69 | Temp 98.3°F | Wt 175.4 lb

## 2012-12-30 DIAGNOSIS — E785 Hyperlipidemia, unspecified: Secondary | ICD-10-CM

## 2012-12-30 DIAGNOSIS — R7309 Other abnormal glucose: Secondary | ICD-10-CM | POA: Diagnosis not present

## 2012-12-30 DIAGNOSIS — I10 Essential (primary) hypertension: Secondary | ICD-10-CM | POA: Diagnosis not present

## 2012-12-30 LAB — HEPATIC FUNCTION PANEL
ALT: 28 U/L (ref 0–53)
AST: 29 U/L (ref 0–37)
Alkaline Phosphatase: 54 U/L (ref 39–117)
Bilirubin, Direct: 0 mg/dL (ref 0.0–0.3)
Total Bilirubin: 0.8 mg/dL (ref 0.3–1.2)

## 2012-12-30 LAB — BASIC METABOLIC PANEL
BUN: 22 mg/dL (ref 6–23)
Creatinine, Ser: 1.3 mg/dL (ref 0.4–1.5)
GFR: 57.54 mL/min — ABNORMAL LOW (ref >60.00)
Potassium: 5.3 mEq/L — ABNORMAL HIGH (ref 3.5–5.1)

## 2012-12-30 LAB — HEMOGLOBIN A1C: Hgb A1c MFr Bld: 5.9 % (ref 4.6–6.5)

## 2012-12-30 LAB — LIPID PANEL: HDL: 37.1 mg/dL — ABNORMAL LOW (ref >39.00)

## 2012-12-30 MED ORDER — HYDROCHLOROTHIAZIDE 12.5 MG PO CAPS: 12.5000 mg | ORAL_CAPSULE | ORAL | Status: DC | PRN

## 2012-12-30 NOTE — Progress Notes (Signed)
Pre visit review using our clinic review tool, if applicable. No additional management support is needed unless otherwise documented below in the visit note. 

## 2012-12-30 NOTE — Patient Instructions (Signed)
Your next office appointment will be determined based upon review of your pending labs &/ or x-rays. Those instructions will be transmitted to you by mail .Minimal Blood Pressure Goal= AVERAGE < 140/90;  Ideal is an AVERAGE < 135/85. This AVERAGE should be calculated from @ least 5-7 BP readings taken @ different times of day on different days of week. You should not respond to isolated BP readings , but rather the AVERAGE for that week .Please bring your  blood pressure cuff to office visits to verify that it is reliable.It  can also be checked against the blood pressure device at the pharmacy. Finger or wrist cuffs are not dependable; an arm cuff is.

## 2012-12-30 NOTE — Assessment & Plan Note (Signed)
A1c

## 2012-12-30 NOTE — Assessment & Plan Note (Addendum)
Repeat BP 142/98 Minimal Blood Pressure Goal= AVERAGE < 140/90;  Ideal is an AVERAGE < 135/85. This AVERAGE should be calculated from @ least 5-7 BP readings taken @ different times of day on different days of week. You should not respond to isolated BP readings , but rather the AVERAGE for that week .Please bring your  blood pressure cuff to office visits to verify that it is reliable.It  can also be checked against the blood pressure device at the pharmacy.  BMET

## 2012-12-30 NOTE — Assessment & Plan Note (Signed)
LFTs, lipids

## 2012-12-30 NOTE — Progress Notes (Signed)
Subjective:    Patient ID: Christopher Richards, male    DOB: 23-Mar-1927, 79 y.o.   MRN: [id number]  HPI The patient is here for followup of hyperlipidemia, hyperglycemia and hypertension.  The most recent A1c was 6.1% in 3/14  , which correlates to an average sugar of 140 , and long-term risk of 22% . Fasting blood sugar  not monitored .  Ophthalmologic examination  pending. No active podiatry assessment on record. Diet is low  Sugar; heart healthy. Exercise as walking 45 min 5 days/ week .  The most recent lipids  10/13 revealed LDL 115 ; HDL 45.4 ; and triglycerides  98 on Niaspan . To date no statin.  Blood pressure  average  147/86 . There is medical  with antihypertensive medications  No  medication adverse effects suggested.         Review of Systems Constitutional: 8#  weight loss; some fatigue Eyes: No  blurred vision;  double vision ; loss of vision Cardiovascular: no chest pain ;palpitations; racing; irregular rhythm ;syncope; claudication ; edema  Respiratory: No exertional dyspnea;  paroxysmal nocturnal dyspnea Genitourinary: No dysuria; hematuria ; pyuria; frequency;  Incontinence;  nocturia Musculoskeletal: No myalgias or muscle cramping  Dermatologic: No non healing  Lesions; change in color or temperature of skin Neurologic: No  limb weakness;  numbness or tingling;  burning Endocrine: No change in hair, skin, nails. No excessive thirst; excessive hunger;  excessive urination     Objective:   Physical Exam Gen.:  well-nourished in appearance. Alert, appropriate and cooperative throughout exam.  Head: Normocephalic without obvious abnormalities; pattern alopecia  Eyes: No corneal or conjunctival inflammation noted.Arcus.   Mouth: Oral mucosa and oropharynx reveal no lesions or exudates. Upper denture; lower partial. Neck: No deformities, masses, or tenderness noted.  Thyroid normal. Lungs: Normal respiratory effort; chest expands symmetrically. Lungs are clear to  auscultation without rales, wheezes, or increased work of breathing. Heart: Normal rate and rhythm. Normal S1 and S2. No gallop, click, or rub. Grade 1/6 systolic murmur. Abdomen: Bowel sounds normal; abdomen soft and nontender. No masses, organomegaly or hernias noted.                                   Musculoskeletal/extremities: No deformity or scoliosis noted of  the thoracic or lumbar spine.  No clubbing, cyanosis, edema, or significant extremity  deformity noted. Range of motion normal .Tone & strength normal. Hand joints normal . Fingernail  health good. Able to lie down & sit up w/o help.  Vascular: Carotid, radial artery, dorsalis pedis and  posterior tibial pulses are full and equal. No bruits present. Neurologic: Alert and oriented x3.       Skin: Intact without suspicious lesions or rashes. Lymph: No cervical, axillary lymphadenopathy present. Psych: Mood and affect are normal. Normally interactive                                                                                        Assessment & Plan:  See Current Assessment & Plan in Problem List under specific Diagnosis

## 2012-12-31 ENCOUNTER — Encounter: Payer: Self-pay | Admitting: *Deleted

## 2012-12-31 NOTE — Progress Notes (Signed)
Letter mailed to patient.

## 2013-03-10 ENCOUNTER — Ambulatory Visit (INDEPENDENT_AMBULATORY_CARE_PROVIDER_SITE_OTHER): Payer: Medicare Other | Admitting: Internal Medicine

## 2013-03-10 ENCOUNTER — Encounter: Payer: Self-pay | Admitting: Internal Medicine

## 2013-03-10 VITALS — BP 133/74 | HR 78 | Temp 97.7°F | Resp 16 | Wt 179.0 lb

## 2013-03-10 DIAGNOSIS — J209 Acute bronchitis, unspecified: Secondary | ICD-10-CM | POA: Diagnosis not present

## 2013-03-10 DIAGNOSIS — I10 Essential (primary) hypertension: Secondary | ICD-10-CM

## 2013-03-10 MED ORDER — AMOXICILLIN 500 MG PO CAPS: 500.0000 mg | ORAL_CAPSULE | Freq: Three times a day (TID) | ORAL | Status: DC

## 2013-03-10 MED ORDER — FLUTICASONE-SALMETEROL 100-50 MCG/DOSE IN AEPB: 1.0000 | INHALATION_SPRAY | Freq: Two times a day (BID) | RESPIRATORY_TRACT | Status: DC

## 2013-03-10 MED ORDER — LOSARTAN POTASSIUM 100 MG PO TABS: 100.0000 mg | ORAL_TABLET | Freq: Every day | ORAL | Status: DC

## 2013-03-10 NOTE — Patient Instructions (Signed)
Plain Mucinex (NOT D) for thick secretions ;force NON dairy fluids .   Nasal cleansing in the shower as discussed with lather of mild shampoo.After 10 seconds wash off lather while  exhaling through nostrils. Make sure that all residual soap is removed to prevent irritation.  Fluticasone 1 spray in each nostril twice a day as needed. Use the "crossover" technique into opposite nostril spraying toward opposite ear @ 45 degree angle, not straight up into nostril.  Use a Neti pot daily only  as needed for significant sinus congestion; going from open side to congested side . Plain Allegra (NOT D )  160 daily , Loratidine 10 mg , OR Zyrtec 10 mg @ bedtime  as needed for itchy eyes & sneezing.   Advair one inhalation every 12 hours; gargle and spit after use

## 2013-03-10 NOTE — Progress Notes (Signed)
   Subjective:    Patient ID: Christopher Richards, male    DOB: May 30, 1927, 80 y.o.   MRN: [id number]  HPI   Symptoms began 10 days ago as some throat congestion associated with some head congestion. Social he developed yellow sputum production and wheezing. He's been using his Advair inhaler as needed with some benefit  He continues to have some yellow sputum production    Review of Systems  He denies frontal sinus pain, facial pain, nasal purulence, dental pain, otic pain, or otic discharge.  He has no fever, chills, or sweats  He denies associated shortness of breath with the cough and wheezing.  He has not had extrinsic symptoms of itchy, watery eyes, or sneezing.     Objective:   Physical Exam General appearance:;well nourished; no acute distress or increased work of breathing is present.  No  lymphadenopathy about the head, neck, or axilla noted.   Eyes: No conjunctival inflammation or lid edema is present.   Ears:  External ear exam shows no significant lesions or deformities.  Otoscopic examination reveals clear canals, tympanic membranes are dull bilaterally without bulging, retraction, inflammation or discharge.  Nose:  External nasal examination shows no deformity or inflammation. Nasal mucosa are pink and moist without lesions or exudates. No septal dislocation or deviation.No obstruction to airflow.   Oral exam: Upper denture & lower partial is good; lips and gums are healthy appearing.There is no oropharyngeal erythema or exudate noted. Hosarse  Neck:  No deformities,  masses, or tenderness noted.    Heart:  Normal rate and regular rhythm. S1 and S2 normal without gallop, murmur, click, rub or other extra sounds.   Lungs:Chest clear to auscultation; no wheezes, rhonchi,rales ,or rubs present.No increased work of breathing.  Decreased BS  Extremities:  No cyanosis, edema, or clubbing  noted    Skin: Warm & dry         Assessment & Plan:  #1 acute bronchitis with  bronchospasm  Plan: See orders and recommendations

## 2013-03-10 NOTE — Progress Notes (Signed)
Pre-visit discussion using our clinic review tool, as applicable. No additional management support is needed unless otherwise documented below in the visit note.  

## 2013-03-25 ENCOUNTER — Telehealth: Payer: Self-pay | Admitting: Internal Medicine

## 2013-03-25 NOTE — Telephone Encounter (Signed)
Relevant patient education mailed to patient.  

## 2013-07-18 ENCOUNTER — Other Ambulatory Visit: Payer: Self-pay | Admitting: Internal Medicine

## 2013-08-14 ENCOUNTER — Other Ambulatory Visit: Payer: Self-pay | Admitting: Internal Medicine

## 2013-11-16 ENCOUNTER — Telehealth (HOSPITAL_BASED_OUTPATIENT_CLINIC_OR_DEPARTMENT_OTHER): Payer: Self-pay | Admitting: Internal Medicine

## 2013-11-16 DIAGNOSIS — I10 Essential (primary) hypertension: Secondary | ICD-10-CM

## 2013-11-16 MED ORDER — LOSARTAN POTASSIUM 100 MG PO TABS
100.0000 mg | ORAL_TABLET | Freq: Every day | ORAL | Status: DC
Start: 2013-11-16 — End: 2014-03-14

## 2013-11-16 NOTE — Telephone Encounter (Signed)
Pt called in said that he lost his losartan (COZAAR) 100 MG tablet [[id number]] on a trip this weekend and needs a 30day supply just to make til next month for refills.      Thank you

## 2013-11-16 NOTE — Telephone Encounter (Signed)
Notified pt rx sent to [facility].../lmb 

## 2013-11-20 IMAGING — CR DG CHEST 2V
2 series · 2 of 2 positions shown · non-contrast
Comparison: None.

CLINICAL DATA: Cough.

CHEST - 2 VIEW

[view not recorded (1 of 2)]
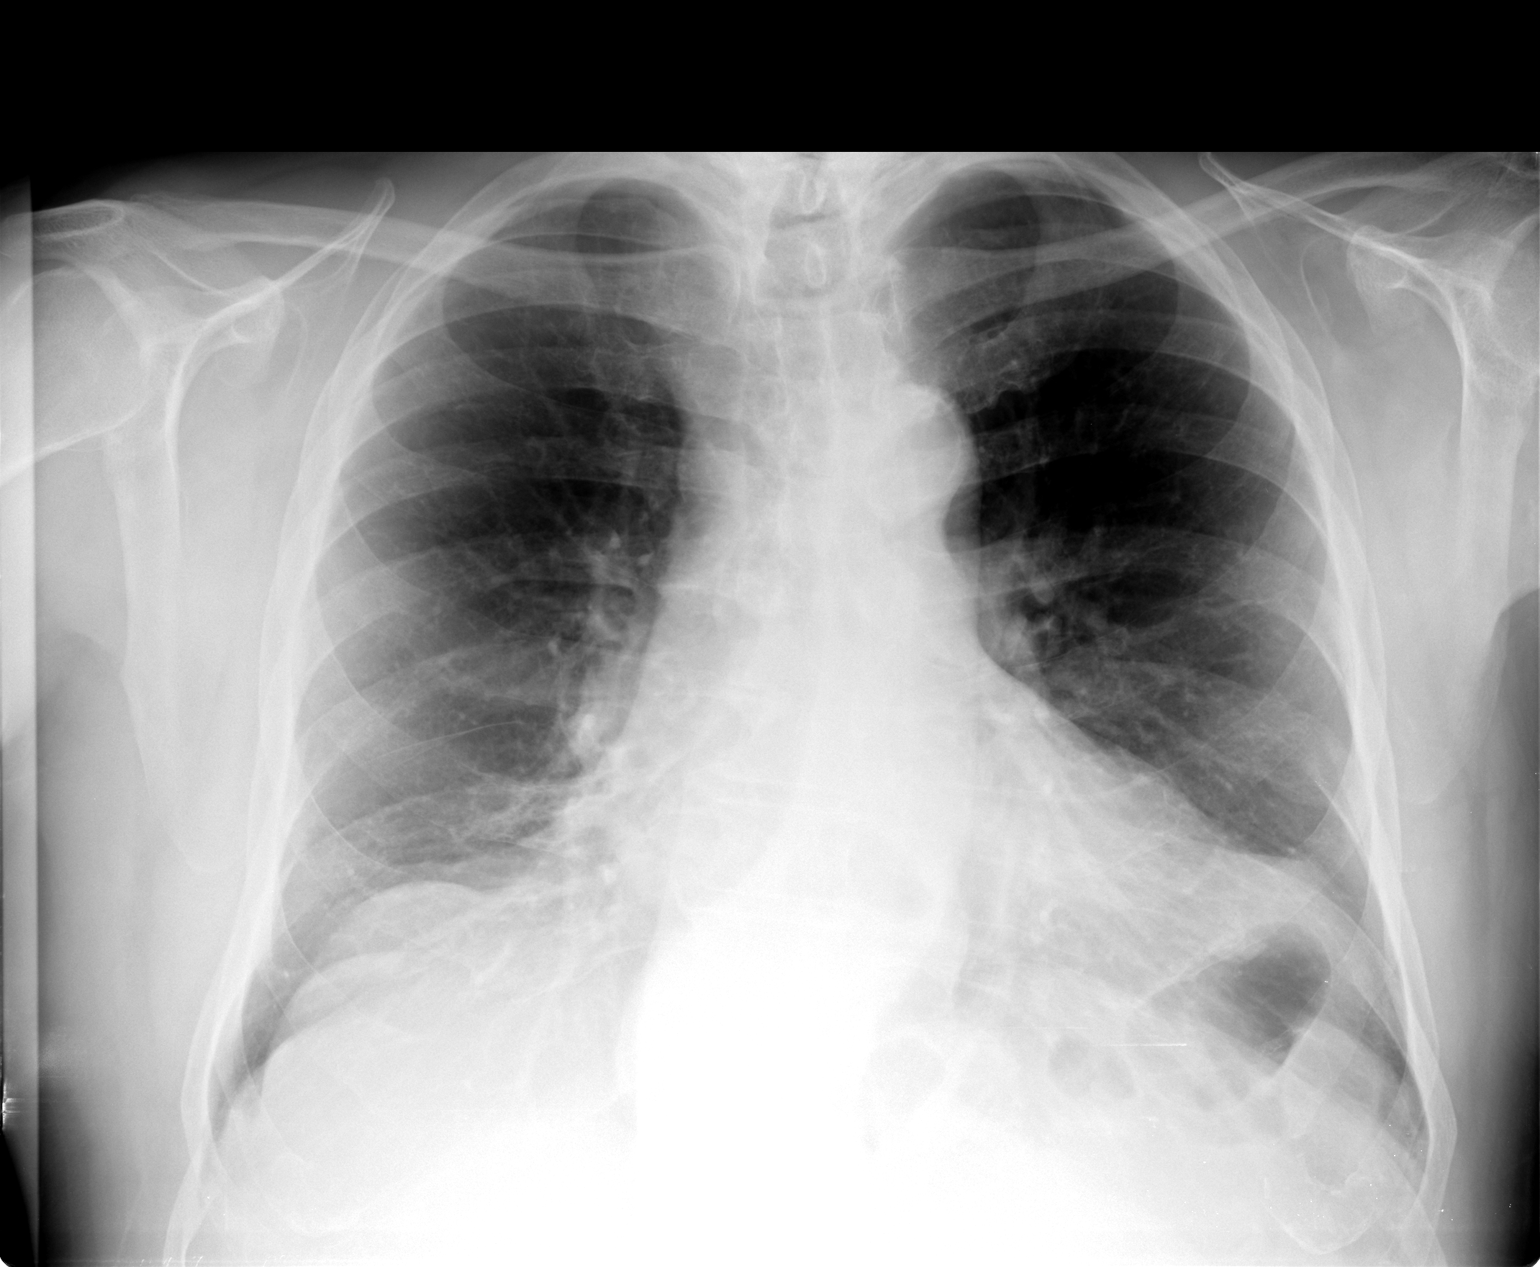

[view not recorded (2 of 2)]
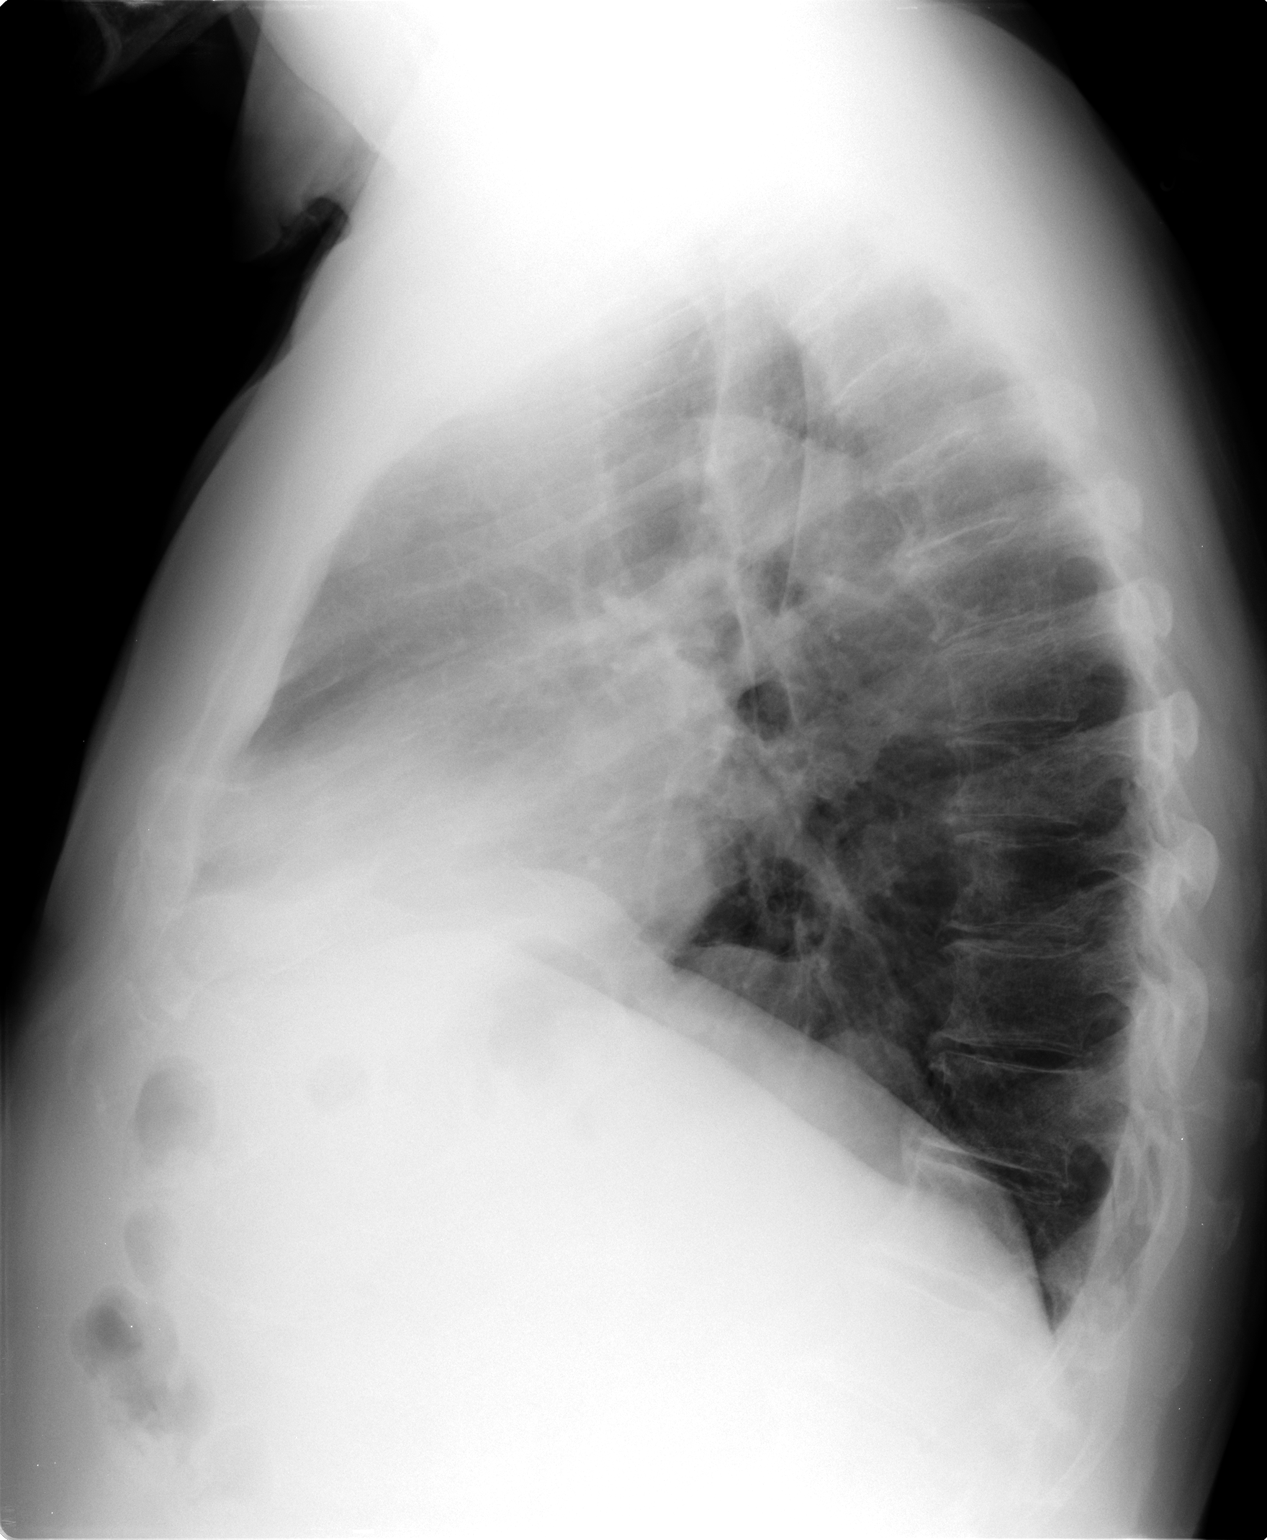

[2 of 2 positions shown; findings below may reference images not displayed]

FINDINGS: Trachea is midline.  Heart size within normal limits.
Thoracic aorta is calcified.  Moderate hiatal hernia.  Lungs are
clear.  No pleural fluid.
IMPRESSION: No acute findings.  Moderate hiatal hernia.

## 2013-12-09 ENCOUNTER — Encounter: Payer: Self-pay | Admitting: Internal Medicine

## 2013-12-09 ENCOUNTER — Ambulatory Visit (INDEPENDENT_AMBULATORY_CARE_PROVIDER_SITE_OTHER): Payer: Medicare Other | Admitting: Internal Medicine

## 2013-12-09 VITALS — BP 158/88 | HR 66 | Temp 97.8°F | Resp 15 | Wt 171.1 lb

## 2013-12-09 DIAGNOSIS — J209 Acute bronchitis, unspecified: Secondary | ICD-10-CM | POA: Diagnosis not present

## 2013-12-09 MED ORDER — PREDNISONE 20 MG PO TABS: 20.0000 mg | ORAL_TABLET | Freq: Two times a day (BID) | ORAL | Status: DC

## 2013-12-09 MED ORDER — AZITHROMYCIN 250 MG PO TABS: ORAL_TABLET | ORAL | Status: DC

## 2013-12-09 NOTE — Progress Notes (Signed)
   Subjective:    Patient ID: Christopher Richards, male    DOB: [birth year], 80 y.o.   MRN: [id number]  HPI   Symptoms began 7 days ago as head congestion; as of 10/17 he developed chest congestion.  He does have some scant yellow nasal discharge at this time but his major issue is choking on a thick phlegm which he is having difficulty producing. He has been using Mucinex with only partial response  The cough has been associated with some wheezing.    Review of Systems   At this time he denies frontal headache, facial pain, sore throat, dental pain, otic pain, or otic discharge  There is no wheezing associated with the cough.  He denies extrinsic symptoms of itchy, watery eyes, sneezing  He has no fever, chills, sweats.       Objective:   Physical Exam   Positive or pertinent findings include: The left tympanic membrane is dull. He is markedly hoarse. He has an upper dental plate and lower partial. Chest is barrel-shaped.  General appearance:good health ;well nourished; no acute distress or increased work of breathing is present.  No  lymphadenopathy about the head, neck, or axilla noted.  Eyes: No conjunctival inflammation or lid edema is present. There is no scleral icterus. Ears:  External ear exam shows no significant lesions or deformities.  Otoscopic examination reveals clear canals, tympanic membranes are intact bilaterally without bulging, retraction, inflammation or discharge. Nose:  External nasal examination shows no deformity or inflammation. Nasal mucosa are pink and moist without lesions or exudates. No septal dislocation or deviation.No obstruction to airflow.  Oral exam: lips and gums are healthy appearing.There is no oropharyngeal erythema or exudate noted.  Neck:  No deformities, thyromegaly, masses, or tenderness noted.   Heart:  Normal rate and regular rhythm. S1 and S2 normal without gallop, murmur, click, rub or other extra sounds.  Lungs:Chest clear to  auscultation; no wheezes, rhonchi,rales ,or rubs present.No increased work of breathing.   Extremities:  No cyanosis, edema, or clubbing  noted  Skin: Warm & dry w/o jaundice or tenting.         Assessment & Plan:  #1 acute bronchitis w/o bronchospasm #2 URI, minor Plan: See orders and recommendations

## 2013-12-09 NOTE — Progress Notes (Signed)
Pre visit review using our clinic review tool, if applicable. No additional management support is needed unless otherwise documented below in the visit note. 

## 2013-12-09 NOTE — Patient Instructions (Signed)

## 2013-12-15 ENCOUNTER — Telehealth: Payer: Self-pay | Admitting: Internal Medicine

## 2013-12-15 DIAGNOSIS — J209 Acute bronchitis, unspecified: Secondary | ICD-10-CM

## 2013-12-15 MED ORDER — AZITHROMYCIN 250 MG PO TABS: 250.0000 mg | ORAL_TABLET | Freq: Once | ORAL | Status: DC

## 2013-12-15 NOTE — Telephone Encounter (Signed)
Pt called and said the he is feeling 80% better but still a lot of mucus.  Wants to know if he needs to come back in or if dr hopper wants to send in another script ??

## 2013-12-15 NOTE — Telephone Encounter (Signed)
Inform pt with md response. Sent to [facility] electronically...Christopher Richards

## 2013-12-15 NOTE — Telephone Encounter (Signed)
Zpack as 1 pill qd X 6 days, not 2 day 1

## 2013-12-18 ENCOUNTER — Telehealth: Payer: Self-pay

## 2013-12-18 NOTE — Telephone Encounter (Signed)
Received a fax from [facility] [phone] requesting clarification of the azithromycin script sent in on 10.27.15. Per Dr Linna Darner I faxed back patient should be taking one tablet daily and #6 was dispensed.

## 2013-12-21 ENCOUNTER — Encounter: Payer: Self-pay | Admitting: Internal Medicine

## 2013-12-21 ENCOUNTER — Ambulatory Visit (INDEPENDENT_AMBULATORY_CARE_PROVIDER_SITE_OTHER): Payer: Medicare Other | Admitting: Internal Medicine

## 2013-12-21 VITALS — BP 130/66 | HR 70 | Temp 98.0°F | Resp 14 | Wt 170.4 lb

## 2013-12-21 DIAGNOSIS — K140 Glossitis: Secondary | ICD-10-CM

## 2013-12-21 MED ORDER — CLOTRIMAZOLE 10 MG MT TROC: 10.0000 mg | Freq: Every day | OROMUCOSAL | Status: DC

## 2013-12-21 NOTE — Patient Instructions (Addendum)
Troches as prescribed. Advair one inhalation every 12 hours if needed; gargle well with water and spit after use

## 2013-12-21 NOTE — Progress Notes (Signed)
   Subjective:    Patient ID: Christopher Richards, male    DOB: December 14, 1927, 80 y.o.   MRN: [id number]  HPI   He had taken Zithromax 200 mg 3 additional days after the original Z-Pak when he developed blisters and redness of his tongue. He has been using Benadryl with some benefit.  His cough and congestion have improved dramatically.  He has no upper history tract symptoms presently.      Review of Systems Frontal headache, facial pain , nasal purulence, dental pain, sore throat , otic pain or otic discharge denied. No fever , chills or sweats.     Objective:   Physical Exam    Positive or pertinent findings include: There is erythema of the tongue and oropharynx. No exudates or vesicles are present.   General appearance:good health ;well nourished; no acute distress or increased work of breathing is present.  No  lymphadenopathy about the head, neck, or axilla noted.  Eyes: No conjunctival inflammation or lid edema is present. There is no scleral icterus. Ears:  External ear exam shows no significant lesions or deformities.  Otoscopic examination reveals clear canals, tympanic membranes are intact bilaterally without bulging, retraction, inflammation or discharge. Nose:  External nasal examination shows no deformity or inflammation. Nasal mucosa are pink and moist without lesions or exudates. No septal dislocation or deviation.No obstruction to airflow.  Oral exam: Dental hygiene is good; lips and gums are healthy appearing. Neck:  No deformities, thyromegaly, masses, or tenderness noted.    Heart:  Normal rate and regular rhythm. S1 and S2 normal without gallop, murmur, click, rub or other extra sounds. Lungs:Chest clear to auscultation; no wheezes, rhonchi,rales ,or rubs present.No increased work of breathing.   Extremities:  No cyanosis, edema, or clubbing  noted  Skin: Warm & dry w/o jaundice or tenting.       Assessment & Plan:  #1 glossitis,probable oral candidiasis from  prolonged antibiotics  Plan: The Zithromax will not be continued  Mycelex troches

## 2014-03-14 ENCOUNTER — Other Ambulatory Visit: Payer: Self-pay | Admitting: Internal Medicine

## 2014-03-23 ENCOUNTER — Ambulatory Visit (INDEPENDENT_AMBULATORY_CARE_PROVIDER_SITE_OTHER): Payer: Medicare Other | Admitting: Internal Medicine

## 2014-03-23 ENCOUNTER — Other Ambulatory Visit (INDEPENDENT_AMBULATORY_CARE_PROVIDER_SITE_OTHER): Payer: Medicare Other

## 2014-03-23 ENCOUNTER — Encounter: Payer: Self-pay | Admitting: Internal Medicine

## 2014-03-23 VITALS — BP 112/68 | HR 65 | Temp 97.9°F | Ht 67.0 in | Wt 164.0 lb

## 2014-03-23 DIAGNOSIS — E785 Hyperlipidemia, unspecified: Secondary | ICD-10-CM | POA: Diagnosis not present

## 2014-03-23 DIAGNOSIS — I10 Essential (primary) hypertension: Secondary | ICD-10-CM

## 2014-03-23 DIAGNOSIS — R739 Hyperglycemia, unspecified: Secondary | ICD-10-CM

## 2014-03-23 DIAGNOSIS — K4090 Unilateral inguinal hernia, without obstruction or gangrene, not specified as recurrent: Secondary | ICD-10-CM | POA: Insufficient documentation

## 2014-03-23 DIAGNOSIS — K4091 Unilateral inguinal hernia, without obstruction or gangrene, recurrent: Secondary | ICD-10-CM | POA: Diagnosis not present

## 2014-03-23 LAB — CBC WITH DIFFERENTIAL/PLATELET
Basophils Absolute: 0 10*3/uL (ref 0.0–0.1)
Basophils Relative: 0.3 % (ref 0.0–3.0)
EOS PCT: 10.5 % — AB (ref 0.0–5.0)
Eosinophils Absolute: 0.9 10*3/uL — ABNORMAL HIGH (ref 0.0–0.7)
HEMATOCRIT: 44.3 % (ref 39.0–52.0)
HEMOGLOBIN: 15 g/dL (ref 13.0–17.0)
LYMPHS ABS: 1.8 10*3/uL (ref 0.7–4.0)
Lymphocytes Relative: 21.5 % (ref 12.0–46.0)
MCHC: 33.9 g/dL (ref 30.0–36.0)
MCV: 83.9 fl (ref 78.0–100.0)
MONO ABS: 0.8 10*3/uL (ref 0.1–1.0)
MONOS PCT: 9 % (ref 3.0–12.0)
NEUTROS ABS: 5 10*3/uL (ref 1.4–7.7)
NEUTROS PCT: 58.7 % (ref 43.0–77.0)
PLATELETS: 323 10*3/uL (ref 150.0–400.0)
RBC: 5.28 Mil/uL (ref 4.22–5.81)
RDW: 14.5 % (ref 11.5–15.5)
WBC: 8.5 10*3/uL (ref 4.0–10.5)

## 2014-03-23 LAB — BASIC METABOLIC PANEL
BUN: 16 mg/dL (ref 6–23)
CALCIUM: 10.1 mg/dL (ref 8.4–10.5)
CO2: 30 meq/L (ref 19–32)
Chloride: 105 mEq/L (ref 96–112)
Creatinine, Ser: 1.1 mg/dL (ref 0.40–1.50)
GFR: 67.72 mL/min (ref >60.00)
Glucose, Bld: 119 mg/dL — ABNORMAL HIGH (ref 70–99)
POTASSIUM: 5 meq/L (ref 3.5–5.1)
SODIUM: 139 meq/L (ref 135–145)

## 2014-03-23 LAB — HEMOGLOBIN A1C: HEMOGLOBIN A1C: 5.9 % (ref 4.6–6.5)

## 2014-03-23 NOTE — Assessment & Plan Note (Signed)
Due to age & prior lipid levels in 2014 off meds;statin will not be pursued

## 2014-03-23 NOTE — Assessment & Plan Note (Signed)
Blood pressure goals reviewed. BMET 

## 2014-03-23 NOTE — Patient Instructions (Signed)
The Surgery  referral will be scheduled and you'll be notified of the time.

## 2014-03-23 NOTE — Progress Notes (Signed)
   Subjective:    Patient ID: Christopher Richards, male    DOB: 05/04/1927, 81 y.o.   MRN: [id number]  HPI  He stepped off a curb 2 weeks ago and experienced immediate right inguinal pain which lasted seconds.  As of 03/19/14 it has become constant in nature and up to a level of 8-9 on 1/30. It does improve with direct pressure.  He has a past history of hernia repair in 1972.  Other than nocturia 2 which is chronic; he has no constitutional, GI, or genitourinary symptoms  He has had mild hyper glycemia in the past but A1c has been nondiabetic  He previously was on Niaspan . Last lipids were in November 2014. He had mildly reduced HDL and mildly elevated LDL. He has never been on statin.  He describes a heart healthy low-salt diet.  He exercises walking and stretching 30-45 minutes daily without cardiopulmonary symptoms.  Blood pressure averages 130 over less than 75.  Review of Systems  Unexplained weight loss, abdominal pain, significant dyspepsia, dysphagia, melena, rectal bleeding, or persistently small caliber stools are denied.   Dysuria, pyuria, hematuria, frequency,  or polyuria are denied.  Chest pain, palpitations, tachycardia, exertional dyspnea, paroxysmal nocturnal dyspnea, claudication or edema are absent.      Objective:   Physical Exam  Pertinent or positive findings include: Appears younger than stated age Arcus senilis is present. Complete dentures. There is a 7.5 x 7.5 cm tender, nonreducible hernia in the right inguinal area.  General appearance :adequately nourished; in no distress.Overweight by CDC criteria. Eyes: No conjunctival inflammation or scleral icterus is present. Oral exam: Lips and gums are healthy appearing.There is no oropharyngeal erythema or exudate noted.  Heart:  Normal rate and regular rhythm. S1 and S2 normal without gallop, murmur, click, rub or other extra sounds   Lungs:Chest clear to auscultation; no wheezes, rhonchi,rales ,or rubs  present.No increased work of breathing.  Abdomen: bowel sounds normal, soft and non-tender without masses, organomegaly or hernias noted.  No guarding or rebound. No flank tenderness to percussion. Vascular : all pulses equal ; no bruits present. Skin:Warm & dry.  Intact without suspicious lesions or rashes ; no jaundice or tenting Lymphatic: No lymphadenopathy is noted about the head, neck, axilla, or inguinal areas.  Neuro: Strength, tone  normal. Genitourinary: Genitalia normal except for  varices in left scrotum.DRE deferred         Assessment & Plan:  See Current Assessment & Plan in Problem List under specific Diagnosis

## 2014-03-23 NOTE — Assessment & Plan Note (Signed)
A1c

## 2014-03-23 NOTE — Progress Notes (Signed)
Pre visit review using our clinic review tool, if applicable. No additional management support is needed unless otherwise documented below in the visit note. 

## 2014-03-23 NOTE — Assessment & Plan Note (Signed)
Gen Surgery consult with Dr Zella Richer

## 2014-03-24 ENCOUNTER — Encounter (INDEPENDENT_AMBULATORY_CARE_PROVIDER_SITE_OTHER): Payer: Self-pay | Admitting: General Surgery

## 2014-03-24 ENCOUNTER — Other Ambulatory Visit: Payer: Self-pay | Admitting: Internal Medicine

## 2014-03-24 DIAGNOSIS — K4091 Unilateral inguinal hernia, without obstruction or gangrene, recurrent: Secondary | ICD-10-CM | POA: Diagnosis not present

## 2014-03-24 NOTE — Progress Notes (Signed)
Patient ID: Christopher Richards, male   DOB: 01-14-1928, 81 y.o.   MRN: [id number]  Christopher Richards 03/24/2014 10:27 AM Location: [facility] Patient #: [id number] DOB: 08/04/1927 Married / Language: Christopher Richards / Race: White Male History of Present Illness Christopher Hollingshead MD; 03/24/2014 12:33 PM) Patient words: hernia.  The patient is a 81 year old male    Note:He is referred by Dr. Linna Richards for further evaluation and treatment of a symptomatic recurrent right inguinal hernia. He underwent right inguinal hernia repair by Dr. Marylene Richards in 1973. Recently, he stepped off a curb and felt a pulling sensation in the right groin area. He then noted a swelling that that has become uncomfortable. He is wearing an athletic supporter to help with the discomfort. Dr. Linna Richards saw him and noted the right inguinal hernia. He is here with his daughter, Christopher Richards, to discuss further treatment. He denies any difficulty starting the urinary stream and he denies constipation.  Other Problems Christopher Richards, CMA; 03/24/2014 10:27 AM) High blood pressure Inguinal Hernia  Past Surgical History Christopher Richards, CMA; 03/24/2014 10:27 AM) Cataract Surgery Bilateral. Knee Surgery Left. Open Inguinal Hernia Surgery Right. Oral Surgery Tonsillectomy Vasectomy  Diagnostic Studies History Christopher Richards, CMA; 03/24/2014 10:27 AM) Colonoscopy 5-10 years ago  Allergies Christopher Richards, CMA; 03/24/2014 10:31 AM) No Known Drug Allergies 03/24/2014 (Marked as Inactive) Amlodipine Besy-Benazepril HCl *ANTIHYPERTENSIVES*  Medication History (Christopher Richards, CMA; 03/24/2014 10:28 AM) Advair Diskus (100-50MCG/DOSE Aero Pow Br Act, Inhalation) Active. Losartan Potassium (100MG  Tablet, Oral) Active. Propranolol HCl (40MG  Tablet, Oral) Active.  Social History Christopher Richards, [place]; 03/24/2014 10:27 AM) Alcohol use Remotely quit alcohol use. Caffeine use Coffee. No drug use Tobacco use Never smoker.  Family History Christopher Richards, [place]; 03/24/2014 10:27 AM) Alcohol Abuse Brother. Heart Disease Father. Hypertension Mother. Migraine Headache Mother.     Review of Systems Christopher Richards CMA; 03/24/2014 10:27 AM) General Not Present- Appetite Loss, Chills, Fatigue, Fever, Night Sweats, Weight Gain and Weight Loss. Skin Not Present- Change in Wart/Mole, Dryness, Hives, Jaundice, New Lesions, Non-Healing Wounds, Rash and Ulcer. HEENT Present- Hearing Loss. Not Present- Earache, Hoarseness, Nose Bleed, Oral Ulcers, Ringing in the Ears, Seasonal Allergies, Sinus Pain, Sore Throat, Visual Disturbances, Wears glasses/contact lenses and Yellow Eyes. Respiratory Not Present- Bloody sputum, Chronic Cough, Difficulty Breathing, Snoring and Wheezing. Breast Not Present- Breast Mass, Breast Pain, Nipple Discharge and Skin Changes. Cardiovascular Not Present- Chest Pain, Difficulty Breathing Lying Down, Leg Cramps, Palpitations, Rapid Heart Rate, Shortness of Breath and Swelling of Extremities. Gastrointestinal Not Present- Abdominal Pain, Bloating, Bloody Stool, Change in Bowel Habits, Chronic diarrhea, Constipation, Difficulty Swallowing, Excessive gas, Gets full quickly at meals, Hemorrhoids, Indigestion, Nausea, Rectal Pain and Vomiting. Male Genitourinary Present- Change in Urinary Stream and Frequency. Not Present- Blood in Urine, Impotence, Nocturia, Painful Urination, Urgency and Urine Leakage. Musculoskeletal Present- Joint Stiffness. Not Present- Back Pain, Joint Pain, Muscle Pain, Muscle Weakness and Swelling of Extremities. Neurological Not Present- Decreased Memory, Fainting, Headaches, Numbness, Seizures, Tingling, Tremor, Trouble walking and Weakness. Psychiatric Not Present- Anxiety, Bipolar, Change in Sleep Pattern, Depression, Fearful and Frequent crying. Endocrine Not Present- Cold Intolerance, Excessive Hunger, Hair Changes, Heat Intolerance, Hot flashes and New Diabetes. Hematology Present- Easy Bruising. Not  Present- Excessive bleeding, Gland problems, HIV and Persistent Infections.  Vitals (Christopher Richards CMA; 03/24/2014 10:28 AM) 03/24/2014 10:28 AM Weight: 164 lb Height: 66in Body Surface Area: 1.86 m Body Mass Index: 26.47 kg/m Temp.: 97.76F(Temporal)  Pulse: 67 (Regular)  BP: 124/80 (Sitting, Left Arm, Standard)     Physical Exam Christopher Hollingshead MD; 03/24/2014 12:34 PM)  The physical exam findings are as follows: Note:General: WDWN elderly male in NAD. Pleasant and cooperative.  CHEST: Breath sounds equal and clear. Respirations nonlabored.  BREASTS: Symmetrical in size. No dominant masses, nipple discharge or suspicious skin lesions.  ABDOMEN: Soft, nontender, nondistended, no hernias.  GU: Faded right inguinal scar with reducible, moderate size bulge. No left inguinal bulges. No testicular masses.  NEUROLOGIC: Alert and oriented, answers questions appropriately.  PSYCHIATRIC: Normal mood, affect , and behavior.    Assessment & Plan Christopher Hollingshead MD; 03/24/2014 12:35 PM)  RECURRENT RIGHT INGUINAL HERNIA (550.91  K40.91) Impression: The hernia is symptomatic and he is interested in repair.  Plan: Open repair of recurrent right inguinal hernia with mesh. I have explained the procedure, risks, and aftercare of inguinal hernia repair. Risks include but are not limited to bleeding, infection, wound problems, anesthesia, recurrence, bladder or intestine injury, urinary retention, testicular dysfunction, chronic pain, mesh problems. He seems to understand and would like to proceed.  Current Plans Free Text Instructions : discussed with patient and provided information. Schedule for Surgery  Christopher Confer, MD

## 2014-03-30 NOTE — Patient Instructions (Addendum)
Christopher Richards  03/30/2014   Your procedure is scheduled on: Tuesday February 16th, 2016  Report to [facility] Main  Entrance and follow signs to               [place] at 0900   AM.  Call this number if you have problems the morning of surgery [phone]   Remember:  Do not eat food or drink liquids :After Midnight.     Take these medicines the morning of surgery with A SIP OF WATER: Inderal ( Propanolol), Advair                                You may not have any metal on your body including hair pins and              piercings  Do not wear jewelry, make-up, lotions, powders or perfumes.             Do not wear nail polish.  Do not shave  48 hours prior to surgery.              Men may shave face and neck.   Do not bring valuables to the hospital. [place].  Contacts, dentures or bridgework may not be worn into surgery.  Leave suitcase in the car. After surgery it may be brought to your room.     Patients discharged the day of surgery will not be allowed to drive home.  Name and phone number of your driver:  Special Instructions: N/A              Please read over the following fact sheets you were given: _____________________________________________________________________             [facility] - Preparing for Surgery Before surgery, you can play an important role.  Because skin is not sterile, your skin needs to be as free of germs as possible.  You can reduce the number of germs on your skin by washing with CHG (chlorahexidine gluconate) soap before surgery.  CHG is an antiseptic cleaner which kills germs and bonds with the skin to continue killing germs even after washing. Please DO NOT use if you have an allergy to CHG or antibacterial soaps.  If your skin becomes reddened/irritated stop using the CHG and inform your nurse when you arrive at Short Stay. Do not shave (including legs and  underarms) for at least 48 hours prior to the first CHG shower.  You may shave your face/neck. Please follow these instructions carefully:  1.  Shower with CHG Soap the night before surgery and the  morning of Surgery.  2.  If you choose to wash your hair, wash your hair first as usual with your  normal  shampoo.  3.  After you shampoo, rinse your hair and body thoroughly to remove the  shampoo.                           4.  Use CHG as you would any other liquid soap.  You can apply chg directly  to the skin and wash  Gently with a scrungie or clean washcloth.  5.  Apply the CHG Soap to your body ONLY FROM THE NECK DOWN.   Do not use on face/ open                           Wound or open sores. Avoid contact with eyes, ears mouth and genitals (private parts).                       Wash face,  Genitals (private parts) with your normal soap.             6.  Wash thoroughly, paying special attention to the area where your surgery  will be performed.  7.  Thoroughly rinse your body with warm water from the neck down.  8.  DO NOT shower/wash with your normal soap after using and rinsing off  the CHG Soap.                9.  Pat yourself dry with a clean towel.            10.  Wear clean pajamas.            11.  Place clean sheets on your bed the night of your first shower and do not  sleep with pets. Day of Surgery : Do not apply any lotions/deodorants the morning of surgery.  Please wear clean clothes to the hospital/surgery center.  FAILURE TO FOLLOW THESE INSTRUCTIONS MAY RESULT IN THE CANCELLATION OF YOUR SURGERY PATIENT SIGNATURE_________________________________  NURSE SIGNATURE__________________________________  ________________________________________________________________________

## 2014-03-31 ENCOUNTER — Encounter (HOSPITAL_COMMUNITY): Payer: Self-pay

## 2014-03-31 ENCOUNTER — Encounter (HOSPITAL_COMMUNITY)
Admission: RE | Admit: 2014-03-31 | Discharge: 2014-03-31 | Disposition: A | Discharge status: Home / Self Care | Payer: Medicare Other | Source: Ambulatory Visit | Attending: General Surgery | Admitting: General Surgery

## 2014-03-31 DIAGNOSIS — K4091 Unilateral inguinal hernia, without obstruction or gangrene, recurrent: Secondary | ICD-10-CM | POA: Diagnosis not present

## 2014-03-31 DIAGNOSIS — Z01818 Encounter for other preprocedural examination: Secondary | ICD-10-CM | POA: Diagnosis not present

## 2014-03-31 HISTORY — DX: Essential (primary) hypertension: I10

## 2014-03-31 HISTORY — DX: Tachycardia, unspecified: R00.0

## 2014-03-31 HISTORY — DX: Bronchitis, not specified as acute or chronic: J40

## 2014-03-31 HISTORY — DX: Unspecified osteoarthritis, unspecified site: M19.90

## 2014-03-31 LAB — PROTIME-INR
INR: 1.02 (ref 0.00–1.49)
PROTHROMBIN TIME: 13.5 s (ref 11.6–15.2)

## 2014-03-31 LAB — HEPATIC FUNCTION PANEL
ALT: 25 U/L (ref 0–53)
AST: 29 U/L (ref 0–37)
Albumin: 4.1 g/dL (ref 3.5–5.2)
Alkaline Phosphatase: 63 U/L (ref 39–117)
Bilirubin, Direct: 0.1 mg/dL (ref 0.0–0.5)
Indirect Bilirubin: 0.6 mg/dL (ref 0.3–0.9)
TOTAL PROTEIN: 7.1 g/dL (ref 6.0–8.3)
Total Bilirubin: 0.7 mg/dL (ref 0.3–1.2)

## 2014-03-31 NOTE — Progress Notes (Signed)
03/23/2014- CBC/DIFF/ BMP in EPIC.

## 2014-04-06 ENCOUNTER — Ambulatory Visit (HOSPITAL_COMMUNITY)
Admission: RE | Admit: 2014-04-06 | Discharge: 2014-04-06 | Disposition: A | Discharge status: Home / Self Care | Payer: Medicare Other | Source: Ambulatory Visit | Attending: General Surgery | Admitting: General Surgery

## 2014-04-06 ENCOUNTER — Ambulatory Visit (HOSPITAL_COMMUNITY): Payer: Medicare Other | Admitting: Registered Nurse

## 2014-04-06 ENCOUNTER — Encounter (HOSPITAL_COMMUNITY)
Admission: RE | Disposition: A | Discharge status: Home / Self Care | Payer: Self-pay | Source: Ambulatory Visit | Attending: General Surgery

## 2014-04-06 ENCOUNTER — Encounter (HOSPITAL_COMMUNITY): Payer: Self-pay | Admitting: *Deleted

## 2014-04-06 DIAGNOSIS — M1990 Unspecified osteoarthritis, unspecified site: Secondary | ICD-10-CM | POA: Insufficient documentation

## 2014-04-06 DIAGNOSIS — Z79899 Other long term (current) drug therapy: Secondary | ICD-10-CM | POA: Diagnosis not present

## 2014-04-06 DIAGNOSIS — I10 Essential (primary) hypertension: Secondary | ICD-10-CM | POA: Insufficient documentation

## 2014-04-06 DIAGNOSIS — K4091 Unilateral inguinal hernia, without obstruction or gangrene, recurrent: Secondary | ICD-10-CM | POA: Diagnosis not present

## 2014-04-06 DIAGNOSIS — Z7951 Long term (current) use of inhaled steroids: Secondary | ICD-10-CM | POA: Insufficient documentation

## 2014-04-06 DIAGNOSIS — K4090 Unilateral inguinal hernia, without obstruction or gangrene, not specified as recurrent: Secondary | ICD-10-CM | POA: Diagnosis not present

## 2014-04-06 HISTORY — PX: INSERTION OF MESH: SHX5868

## 2014-04-06 HISTORY — PX: INGUINAL HERNIA REPAIR: SHX194

## 2014-04-06 SURGERY — REPAIR, HERNIA, INGUINAL, ADULT
Anesthesia: General | Site: Groin | Laterality: Right

## 2014-04-06 MED ORDER — ONDANSETRON HCL 4 MG/2ML IJ SOLN: 4.0000 mg | Freq: Once | INTRAMUSCULAR | Status: DC | PRN

## 2014-04-06 MED ORDER — EPHEDRINE SULFATE 50 MG/ML IJ SOLN
INTRAMUSCULAR | Status: DC | PRN
  Administered 2014-04-06 (×3): 5 mg via INTRAVENOUS

## 2014-04-06 MED ORDER — HYDROCODONE-ACETAMINOPHEN 5-325 MG PO TABS: 1.0000 | ORAL_TABLET | ORAL | Status: DC | PRN

## 2014-04-06 MED ORDER — BUPIVACAINE-EPINEPHRINE 0.5% -1:200000 IJ SOLN
INTRAMUSCULAR | Status: DC | PRN
  Administered 2014-04-06: 17 mL

## 2014-04-06 MED ORDER — SODIUM CHLORIDE 0.9 % IJ SOLN: 3.0000 mL | INTRAMUSCULAR | Status: DC | PRN

## 2014-04-06 MED ORDER — FENTANYL CITRATE 0.05 MG/ML IJ SOLN
INTRAMUSCULAR | Status: AC
  Filled 2014-04-06: qty 5

## 2014-04-06 MED ORDER — LACTATED RINGERS IV SOLN
INTRAVENOUS | Status: DC
  Administered 2014-04-06: 1000 mL via INTRAVENOUS

## 2014-04-06 MED ORDER — LIDOCAINE HCL (CARDIAC) 20 MG/ML IV SOLN
INTRAVENOUS | Status: AC
  Filled 2014-04-06: qty 5

## 2014-04-06 MED ORDER — ACETAMINOPHEN 10 MG/ML IV SOLN
1000.0000 mg | Freq: Once | INTRAVENOUS | Status: AC
  Administered 2014-04-06: 1000 mg via INTRAVENOUS
  Filled 2014-04-06: qty 100

## 2014-04-06 MED ORDER — HYDROMORPHONE HCL 1 MG/ML IJ SOLN: 0.2500 mg | INTRAMUSCULAR | Status: DC | PRN

## 2014-04-06 MED ORDER — SUCCINYLCHOLINE CHLORIDE 20 MG/ML IJ SOLN
INTRAMUSCULAR | Status: DC | PRN
  Administered 2014-04-06: 100 mg via INTRAVENOUS

## 2014-04-06 MED ORDER — PROPOFOL 10 MG/ML IV BOLUS
INTRAVENOUS | Status: AC
  Filled 2014-04-06: qty 20

## 2014-04-06 MED ORDER — BUPIVACAINE-EPINEPHRINE (PF) 0.5% -1:200000 IJ SOLN
INTRAMUSCULAR | Status: AC
  Filled 2014-04-06: qty 30

## 2014-04-06 MED ORDER — ROCURONIUM BROMIDE 100 MG/10ML IV SOLN
INTRAVENOUS | Status: AC
  Filled 2014-04-06: qty 1

## 2014-04-06 MED ORDER — ROCURONIUM BROMIDE 100 MG/10ML IV SOLN
INTRAVENOUS | Status: DC | PRN
  Administered 2014-04-06: 20 mg via INTRAVENOUS

## 2014-04-06 MED ORDER — ACETAMINOPHEN 325 MG PO TABS: 650.0000 mg | ORAL_TABLET | ORAL | Status: DC | PRN

## 2014-04-06 MED ORDER — CEFAZOLIN SODIUM-DEXTROSE 2-3 GM-% IV SOLR
2.0000 g | INTRAVENOUS | Status: AC
  Administered 2014-04-06: 2 g via INTRAVENOUS

## 2014-04-06 MED ORDER — CEFAZOLIN SODIUM-DEXTROSE 2-3 GM-% IV SOLR
INTRAVENOUS | Status: AC
  Filled 2014-04-06: qty 50

## 2014-04-06 MED ORDER — LIDOCAINE HCL (CARDIAC) 20 MG/ML IV SOLN
INTRAVENOUS | Status: DC | PRN
  Administered 2014-04-06: 80 mg via INTRAVENOUS

## 2014-04-06 MED ORDER — HYDROCODONE-ACETAMINOPHEN 5-325 MG PO TABS
1.0000 | ORAL_TABLET | ORAL | Status: DC | PRN
  Administered 2014-04-06: 1 via ORAL
  Filled 2014-04-06: qty 1

## 2014-04-06 MED ORDER — 0.9 % SODIUM CHLORIDE (POUR BTL) OPTIME
TOPICAL | Status: DC | PRN
  Administered 2014-04-06: 1000 mL

## 2014-04-06 MED ORDER — MORPHINE SULFATE 10 MG/ML IJ SOLN: 2.0000 mg | INTRAMUSCULAR | Status: DC | PRN

## 2014-04-06 MED ORDER — ONDANSETRON HCL 4 MG/2ML IJ SOLN
INTRAMUSCULAR | Status: DC | PRN
  Administered 2014-04-06: 4 mg via INTRAVENOUS

## 2014-04-06 MED ORDER — FENTANYL CITRATE 0.05 MG/ML IJ SOLN
INTRAMUSCULAR | Status: DC | PRN
  Administered 2014-04-06 (×2): 50 ug via INTRAVENOUS

## 2014-04-06 MED ORDER — PROPOFOL 10 MG/ML IV BOLUS
INTRAVENOUS | Status: DC | PRN
  Administered 2014-04-06: 120 mg via INTRAVENOUS

## 2014-04-06 MED ORDER — ACETAMINOPHEN 650 MG RE SUPP
650.0000 mg | RECTAL | Status: DC | PRN
Start: 2014-04-06 — End: 2014-04-06

## 2014-04-06 MED ORDER — ONDANSETRON HCL 4 MG/2ML IJ SOLN
INTRAMUSCULAR | Status: AC
  Filled 2014-04-06: qty 2

## 2014-04-06 SURGICAL SUPPLY — 38 items
BENZOIN TINCTURE PRP APPL 2/3 (GAUZE/BANDAGES/DRESSINGS) ×3 IMPLANT
BLADE HEX COATED 2.75 (ELECTRODE) ×3 IMPLANT
BLADE SURG 15 STRL LF DISP TIS (BLADE) ×1 IMPLANT
BLADE SURG 15 STRL SS (BLADE) ×2
CATH KIT ON Q 2.5IN SLV (PAIN MANAGEMENT) IMPLANT
CLOSURE WOUND 1/2 X4 (GAUZE/BANDAGES/DRESSINGS) ×1
DECANTER SPIKE VIAL GLASS SM (MISCELLANEOUS) ×3 IMPLANT
DRAIN PENROSE 18X1/2 LTX STRL (DRAIN) ×3 IMPLANT
DRAPE INCISE IOBAN 66X45 STRL (DRAPES) ×3 IMPLANT
DRAPE LAPAROTOMY TRNSV 102X78 (DRAPE) ×3 IMPLANT
DRSG TEGADERM 4X4.75 (GAUZE/BANDAGES/DRESSINGS) ×3 IMPLANT
ELECT REM PT RETURN 9FT ADLT (ELECTROSURGICAL) ×3
ELECTRODE REM PT RTRN 9FT ADLT (ELECTROSURGICAL) ×1 IMPLANT
GAUZE SPONGE 4X4 12PLY STRL (GAUZE/BANDAGES/DRESSINGS) IMPLANT
GLOVE ECLIPSE 8.0 STRL XLNG CF (GLOVE) ×3 IMPLANT
GLOVE INDICATOR 8.0 STRL GRN (GLOVE) ×3 IMPLANT
GOWN STRL REUS W/TWL XL LVL3 (GOWN DISPOSABLE) ×6 IMPLANT
KIT BASIN OR (CUSTOM PROCEDURE TRAY) ×3 IMPLANT
MESH HERNIA 3X6 (Mesh General) ×3 IMPLANT
NEEDLE HYPO 25X1 1.5 SAFETY (NEEDLE) ×3 IMPLANT
NS IRRIG 1000ML POUR BTL (IV SOLUTION) ×3 IMPLANT
PACK BASIC VI WITH GOWN DISP (CUSTOM PROCEDURE TRAY) ×3 IMPLANT
PENCIL BUTTON HOLSTER BLD 10FT (ELECTRODE) ×3 IMPLANT
PUMP ON Q 100MLX2ML/HR (PAIN MANAGEMENT) IMPLANT
SPONGE LAP 4X18 X RAY DECT (DISPOSABLE) ×3 IMPLANT
STRIP CLOSURE SKIN 1/2X4 (GAUZE/BANDAGES/DRESSINGS) ×2 IMPLANT
SUT MNCRL AB 4-0 PS2 18 (SUTURE) ×6 IMPLANT
SUT PROLENE 2 0 CT2 30 (SUTURE) ×12 IMPLANT
SUT VIC AB 2-0 SH 18 (SUTURE) ×6 IMPLANT
SUT VIC AB 3-0 54XBRD REEL (SUTURE) ×2 IMPLANT
SUT VIC AB 3-0 BRD 54 (SUTURE) ×4
SUT VIC AB 3-0 SH 27 (SUTURE) ×6
SUT VIC AB 3-0 SH 27XBRD (SUTURE) ×3 IMPLANT
SYR 20CC LL (SYRINGE) ×3 IMPLANT
SYR BULB IRRIGATION 50ML (SYRINGE) ×3 IMPLANT
TOWEL OR 17X26 10 PK STRL BLUE (TOWEL DISPOSABLE) ×3 IMPLANT
TOWEL OR NON WOVEN STRL DISP B (DISPOSABLE) ×3 IMPLANT
YANKAUER SUCT BULB TIP 10FT TU (MISCELLANEOUS) ×3 IMPLANT

## 2014-04-06 NOTE — Transfer of Care (Signed)
Immediate Anesthesia Transfer of Care Note  Patient: Christopher Richards  Procedure(s) Performed: Procedure(s): REPAIR RIGHT INGUINAL HERNIA WITH MESH (Right) INSERTION OF MESH (Right)  Patient Location: PACU  Anesthesia Type:General  Level of Consciousness: awake, sedated and patient cooperative  Airway & Oxygen Therapy: Patient Spontanous Breathing and Patient connected to face mask oxygen  Post-op Assessment: Report given to RN and Post -op Vital signs reviewed and stable  Post vital signs: Reviewed and stable  Last Vitals:  Filed Vitals:   04/06/14 0845  BP: 159/75  Pulse: 65  Temp: 36.3 C  Resp: 20    Complications: No apparent anesthesia complications

## 2014-04-06 NOTE — H&P (View-Only) (Signed)
Patient ID: Christopher Richards, male   DOB: October 29, 1927, 81 y.o.   MRN: [id number]  Christopher Richards 03/24/2014 10:27 AM Location: [facility] Patient #: [id number] DOB: 20-Jan-1928 Married / Language: Christopher Richards / Race: White Male History of Present Illness Christopher Hollingshead MD; 03/24/2014 12:33 PM) Patient words: hernia.  The patient is a 81 year old male    Note:He is referred by Dr. Linna Richards for further evaluation and treatment of a symptomatic recurrent right inguinal hernia. He underwent right inguinal hernia repair by Dr. Marylene Richards in 1973. Recently, he stepped off a curb and felt a pulling sensation in the right groin area. He then noted a swelling that that has become uncomfortable. He is wearing an athletic supporter to help with the discomfort. Dr. Linna Richards saw him and noted the right inguinal hernia. He is here with his daughter, Christopher Richards, to discuss further treatment. He denies any difficulty starting the urinary stream and he denies constipation.  Other Problems Christopher Richards, Richards; 03/24/2014 10:27 AM) High blood pressure Inguinal Hernia  Past Surgical History Christopher Richards, Richards; 03/24/2014 10:27 AM) Cataract Surgery Bilateral. Knee Surgery Left. Open Inguinal Hernia Surgery Right. Oral Surgery Tonsillectomy Vasectomy  Diagnostic Studies History Christopher Richards, Richards; 03/24/2014 10:27 AM) Colonoscopy 5-10 years ago  Allergies Christopher Richards, Richards; 03/24/2014 10:31 AM) No Known Drug Allergies 03/24/2014 (Marked as Inactive) Amlodipine Besy-Benazepril HCl *ANTIHYPERTENSIVES*  Medication History (Christopher Richards, Richards; 03/24/2014 10:28 AM) Advair Diskus (100-50MCG/DOSE Aero Pow Br Act, Inhalation) Active. Losartan Potassium (100MG  Tablet, Oral) Active. Propranolol HCl (40MG  Tablet, Oral) Active.  Social History Christopher Richards, S.N.P.J.; 03/24/2014 10:27 AM) Alcohol use Remotely quit alcohol use. Caffeine use Coffee. No drug use Tobacco use Never smoker.  Family History Christopher Richards, Christopher Richards; 03/24/2014 10:27 AM) Alcohol Abuse Christopher Richards. Heart Disease Christopher Richards. Hypertension Christopher Richards. Migraine Headache Christopher Richards.     Review of Systems Christopher Richards; 03/24/2014 10:27 AM) General Not Present- Appetite Loss, Chills, Fatigue, Fever, Night Sweats, Weight Gain and Weight Loss. Skin Not Present- Change in Wart/Mole, Dryness, Hives, Jaundice, New Lesions, Non-Healing Wounds, Rash and Ulcer. HEENT Present- Hearing Loss. Not Present- Earache, Hoarseness, Nose Bleed, Oral Ulcers, Ringing in the Ears, Seasonal Allergies, Sinus Pain, Sore Throat, Visual Disturbances, Wears glasses/contact lenses and Yellow Eyes. Respiratory Not Present- Bloody sputum, Chronic Cough, Difficulty Breathing, Snoring and Wheezing. Breast Not Present- Breast Mass, Breast Pain, Nipple Discharge and Skin Changes. Cardiovascular Not Present- Chest Pain, Difficulty Breathing Lying Down, Leg Cramps, Palpitations, Rapid Heart Rate, Shortness of Breath and Swelling of Extremities. Gastrointestinal Not Present- Abdominal Pain, Bloating, Bloody Stool, Change in Bowel Habits, Chronic diarrhea, Constipation, Difficulty Swallowing, Excessive gas, Gets full quickly at meals, Hemorrhoids, Indigestion, Nausea, Rectal Pain and Vomiting. Male Genitourinary Present- Change in Urinary Stream and Frequency. Not Present- Blood in Urine, Impotence, Nocturia, Painful Urination, Urgency and Urine Leakage. Musculoskeletal Present- Joint Stiffness. Not Present- Back Pain, Joint Pain, Muscle Pain, Muscle Weakness and Swelling of Extremities. Neurological Not Present- Decreased Memory, Fainting, Headaches, Numbness, Seizures, Tingling, Tremor, Trouble walking and Weakness. Psychiatric Not Present- Anxiety, Bipolar, Change in Sleep Pattern, Depression, Fearful and Frequent crying. Endocrine Not Present- Cold Intolerance, Excessive Hunger, Hair Changes, Heat Intolerance, Hot flashes and New Diabetes. Hematology Present- Easy Bruising. Not  Present- Excessive bleeding, Gland problems, HIV and Persistent Infections.  Vitals (Christopher Richards Richards; 03/24/2014 10:28 AM) 03/24/2014 10:28 AM Weight: 164 lb Height: 66in Body Surface Area: 1.86 m Body Mass Index: 26.47 kg/m Temp.: 97.24F(Temporal)  Pulse: 67 (Regular)  BP: 124/80 (Sitting, Left Arm, Standard)     Physical Exam Christopher Hollingshead MD; 03/24/2014 12:34 PM)  The physical exam findings are as follows: Note:General: WDWN elderly male in NAD. Pleasant and cooperative.  CHEST: Breath sounds equal and clear. Respirations nonlabored.  BREASTS: Symmetrical in size. No dominant masses, nipple discharge or suspicious skin lesions.  ABDOMEN: Soft, nontender, nondistended, no hernias.  GU: Faded right inguinal scar with reducible, moderate size bulge. No left inguinal bulges. No testicular masses.  NEUROLOGIC: Alert and oriented, answers questions appropriately.  PSYCHIATRIC: Normal mood, affect , and behavior.    Assessment & Plan Christopher Hollingshead MD; 03/24/2014 12:35 PM)  RECURRENT RIGHT INGUINAL HERNIA (550.91  K40.91) Impression: The hernia is symptomatic and he is interested in repair.  Plan: Open repair of recurrent right inguinal hernia with mesh. I have explained the procedure, risks, and aftercare of inguinal hernia repair. Risks include but are not limited to bleeding, infection, wound problems, anesthesia, recurrence, bladder or intestine injury, urinary retention, testicular dysfunction, chronic pain, mesh problems. He seems to understand and would like to proceed.  Current Plans Free Text Instructions : discussed with patient and provided information. Schedule for Surgery  Christopher Confer, MD

## 2014-04-06 NOTE — Anesthesia Preprocedure Evaluation (Addendum)
Anesthesia Evaluation  Patient identified by MRN, date of birth, ID band Patient awake    Reviewed: Allergy & Precautions, NPO status , Patient's Chart, lab work & pertinent test results  Airway        Dental   Pulmonary          Cardiovascular hypertension, + Valvular Problems/Murmurs AI     Neuro/Psych    GI/Hepatic   Endo/Other    Renal/GU      Musculoskeletal  (+) Arthritis -,   Abdominal   Peds  Hematology   Anesthesia Other Findings   Reproductive/Obstetrics                           Anesthesia Physical Anesthesia Plan  ASA: II  Anesthesia Plan: General   Post-op Pain Management:    Induction: Intravenous  Airway Management Planned: LMA and Oral ETT  Additional Equipment:   Intra-op Plan:   Post-operative Plan: Extubation in OR  Informed Consent: I have reviewed the patients History and Physical, chart, labs and discussed the procedure including the risks, benefits and alternatives for the proposed anesthesia with the patient or authorized representative who has indicated his/her understanding and acceptance.     Plan Discussed with: CRNA, Anesthesiologist and Surgeon  Anesthesia Plan Comments:         Anesthesia Quick Evaluation

## 2014-04-06 NOTE — Interval H&P Note (Signed)
History and Physical Interval Note:  04/06/2014 11:10 AM  Christopher Richards  has presented today for surgery, with the diagnosis of RECURRENT RIGHT INGUINAL HERNIA  The various methods of treatment have been discussed with the patient and family. After consideration of risks, benefits and other options for treatment, the patient has consented to  Procedure(s): REPAIR RECURRENT RIGHT INGUINAL HERNIA WITH MESH (Right) INSERTION OF MESH (N/A) as a surgical intervention .  The patient's history has been reviewed, patient examined, no change in status, stable for surgery.  I have reviewed the patient's chart and labs.  Questions were answered to the patient's satisfaction.     [name] [name]

## 2014-04-06 NOTE — Anesthesia Procedure Notes (Signed)
Procedure Name: Intubation Date/Time: 04/06/2014 11:24 AM Performed by: Carleene Cooper A Pre-anesthesia Checklist: Patient identified, Timeout performed, Emergency Drugs available, Suction available and Patient being monitored Patient Re-evaluated:Patient Re-evaluated prior to inductionOxygen Delivery Method: Circle system utilized Preoxygenation: Pre-oxygenation with 100% oxygen Intubation Type: IV induction Ventilation: Mask ventilation without difficulty Laryngoscope Size: Mac and 4 Grade View: Grade I Tube type: Oral Tube size: 7.5 mm Number of attempts: 1 Airway Equipment and Method: Stylet Placement Confirmation: ETT inserted through vocal cords under direct vision,  breath sounds checked- equal and bilateral and positive ETCO2 Secured at: 22 cm Tube secured with: Tape Dental Injury: Teeth and Oropharynx as per pre-operative assessment

## 2014-04-06 NOTE — Discharge Instructions (Addendum)
[facility] _______[facility]   INGUINAL HERNIA REPAIR: POST OP INSTRUCTIONS  Always review your discharge instruction sheet given to you by the facility where your surgery was performed. IF YOU HAVE DISABILITY OR FAMILY LEAVE FORMS, YOU MUST BRING THEM TO THE OFFICE FOR PROCESSING.   DO NOT GIVE THEM TO YOUR DOCTOR.  1. A  prescription for pain medication may be given to you upon discharge.  Take your pain medication as prescribed, if needed.  If narcotic pain medicine is not needed, then you may take acetaminophen (Tylenol) or ibuprofen (Advil) as needed. 2. Take your usually prescribed medications unless otherwise directed. 3. If you need a refill on your pain medication, please contact your pharmacy.  They will contact our office to request authorization. Prescriptions will not be filled after 5 pm or on week-ends. 4. You should follow a light diet the first 24 hours after arrival home, such as soup and crackers, etc.  Be sure to include lots of fluids daily.  Resume your normal diet the day after surgery. 5. Most patients will experience some swelling and bruising  in the groin and scrotum.  Ice packs and reclining will help.  Swelling and bruising can take several days to resolve.  6. It is common to experience some constipation if taking pain medication after surgery.  Increasing fluid intake and taking a stool softener (such as Colace) will usually help or prevent this problem from occurring.  A mild laxative (Milk of Magnesia or Miralax) should be taken according to package directions if there are no bowel movements after 48 hours. 7. Unless discharge instructions indicate otherwise, you may remove your bandages 72 hours after surgery, and you may shower the day after surgery.  You may have steri-strips (small skin tapes) in place directly over the incision.  These strips should be left on the skin.  If your surgeon used skin glue on the incision, you may shower in 24 hours.  The  glue will flake off over the next 2-3 weeks.  Any sutures or staples will be removed at the office during your follow-up visit. 8. ACTIVITIES:  You may resume regular (light) daily activities beginning the next day--such as daily self-care, walking, climbing stairs--gradually increasing activities as tolerated.  You may have sexual intercourse when it is comfortable.  Refrain from any heavy lifting or straining-nothing over 10 pounds for 6 weeks.  a. You may drive when you are no longer taking prescription pain medication, you can comfortably wear a seatbelt, and you can safely maneuver your car and apply brakes. b. RETURN TO WORK:  __________________________________________________________ 9. You should see your doctor in the office for a follow-up appointment approximately 2-3 weeks after your surgery.  Make sure that you call for this appointment within a day or two after you arrive home to insure a convenient appointment time. 10. OTHER INSTRUCTIONS:  __________________________________________________________________________________________________________________________________________________________________________________________  WHEN TO CALL YOUR DOCTOR: 1. Fever over 101.0 2. Inability to urinate 3. Nausea and/or vomiting 4. Extreme swelling or bruising 5. Continued bleeding from incision. 6. Increased pain, redness, or drainage from the incision  The clinic staff is available to answer your questions during regular business hours.  Please dont hesitate to call and ask to speak to one of the nurses for clinical concerns.  If you have a medical emergency, go to the nearest emergency room or call 911.  A surgeon from [facility] is always on call at the hospital   [address]  [place], [place], [place]  [address] ?  [address], [place], [place]   [address] [phone] ? [phone] ? FAX [phone] Web site:  [email or web address]        General Anesthesia, Care After Refer to this sheet in the next few weeks. These instructions provide you with information on caring for yourself after your procedure. Your health care provider may also give you more specific instructions. Your treatment has been planned according to current medical practices, but problems sometimes occur. Call your health care provider if you have any problems or questions after your procedure. WHAT TO EXPECT AFTER THE PROCEDURE After the procedure, it is typical to experience:  Sleepiness.  Nausea and vomiting. HOME CARE INSTRUCTIONS  For the first 24 hours after general anesthesia:  Have a responsible person with you.  Do not drive a car. If you are alone, do not take public transportation.  Do not drink alcohol.  Do not take medicine that has not been prescribed by your health care provider.  Do not sign important papers or make important decisions.  You may resume a normal diet and activities as directed by your health care provider.  Change bandages (dressings) as directed.  If you have questions or problems that seem related to general anesthesia, call the hospital and ask for the anesthetist or anesthesiologist on call. SEEK MEDICAL CARE IF:  You have nausea and vomiting that continue the day after anesthesia.  You develop a rash. SEEK IMMEDIATE MEDICAL CARE IF:   You have difficulty breathing.  You have chest pain.  You have any allergic problems. Document Released: 05/14/2000 Document Revised: 02/10/2013 Document Reviewed: 08/21/2012 [facility] Patient Information 2015 [place], [place]. This information is not intended to replace advice given to you by your health care provider. Make sure you discuss any questions you have with your health care provider.

## 2014-04-06 NOTE — Anesthesia Postprocedure Evaluation (Signed)
  Anesthesia Post-op Note  Patient: Christopher Richards  Procedure(s) Performed: Procedure(s): REPAIR RIGHT INGUINAL HERNIA WITH MESH (Right) INSERTION OF MESH (Right)  Patient Location: PACU  Anesthesia Type:General  Level of Consciousness: awake, alert , oriented and patient cooperative  Airway and Oxygen Therapy: Patient Spontanous Breathing  Post-op Pain: mild  Post-op Assessment: Post-op Vital signs reviewed, Patient's Cardiovascular Status Stable, Respiratory Function Stable, Patent Airway, No signs of Nausea or vomiting and Pain level controlled  Post-op Vital Signs: stable  Last Vitals:  Filed Vitals:   04/06/14 1331  BP: 162/73  Pulse: 72  Temp: 36.4 C  Resp: 15    Complications: No apparent anesthesia complications

## 2014-04-06 NOTE — Op Note (Addendum)
Preoperative diagnosis:  Recurrent right inguinal hernia.  Postoperative diagnosis:  Initial right inguinal hernia  Procedure:  Right inguinal hernia repair with mesh.  Surgeon:  Jackolyn Confer, M.D.  Anesthesia:  General/LMA with local (Marcaine).  Indication:  This is an 81 year old male who presented to the office because of a recurrent right inguinal hernia. He states he had a right inguinal hernia repair in 1973 and now has a recurrent, uncomfortable bulge. In the office it was difficult to see a scar in the right or left groin but a small suspicious area was noted in the right groin. He now presents for repair.  Technique:  He was seen in the holding room and the right groin was marked with my initials. He was brought to the operating, placed supine on the operating table, and the anesthetic was administered by the anesthesiologist. The hair in the groin areas was clipped as was felt to be necessary.  After clipping the hair, we discovered a very subtle small left groin scar. Thus his right inguinal hernia was his initial hernia not a recurrent hernia.  Local anesthetic was infiltrated in the superficial and deep tissues in the right groin.  An incision was made through the skin and subcutaneous tissue until the external oblique aponeurosis was identified.  Local anesthetic was infiltrated deep to the external oblique aponeurosis. The external oblique aponeurosis was divided through the external ring medially and back toward the anterior superior iliac spine laterally. Using blunt dissection, the shelving edge of the inguinal ligament was identified inferiorly and the internal oblique aponeurosis and muscle were identified superiorly. The ilioinguinal nerve was identified and preserved.  The spermatic cord was isolated and a posterior window was made around it. An indirect hernia sac along with herniated extraperitoneal fat was identified and separated from the spermatic cord using blunt  dissection.  The spermatic cord itself was bulky. The hernia sac and its contents were reduced through the indirect hernia defect.   A piece of 3" x 6" polypropylene mesh was brought into the field and anchored 1-2 cm medial to the pubic tubercle with 2-0 Prolene suture. The inferior aspect of the mesh was anchored to the shelving edge of the inguinal ligament with running 2-0 Prolene suture to a level 1-2 cm lateral to the internal ring. A slit was cut in the mesh creating 2 tails. These were wrapped around the spermatic cord. The superior aspect of the mesh was anchored to the internal oblique aponeurosis and muscle with interrupted 2-0 Vicryl sutures. The 2 tails of the mesh were then crossed creating a new internal ring and were anchored to the shelving edge of the inguinal ligament with 2-0 Prolene suture. The tip of a hemostat could be placed through the new aperture. The lateral aspect of the mesh was then tucked deep to the external oblique aponeurosis.  The wound was inspected and hemostasis was adequate. The external oblique aponeurosis was then closed over the mesh and cord with running 3-0 Vicryl suture. The subcutaneous tissue was closed with running 3-0 Vicryl suture. The skin closed with a running 4-0 Monocryl subcuticular stitch.  Steri-Strips and a sterile dressing were applied.  The procedure was well-tolerated without any apparent complications and he was taken to the recovery room in satisfactory condition.

## 2014-04-07 ENCOUNTER — Encounter (HOSPITAL_COMMUNITY): Payer: Self-pay | Admitting: General Surgery

## 2014-06-23 ENCOUNTER — Ambulatory Visit (INDEPENDENT_AMBULATORY_CARE_PROVIDER_SITE_OTHER): Payer: Medicare Other | Admitting: *Deleted

## 2014-06-23 DIAGNOSIS — Z23 Encounter for immunization: Secondary | ICD-10-CM | POA: Diagnosis not present

## 2014-09-16 ENCOUNTER — Other Ambulatory Visit: Payer: Self-pay | Admitting: Internal Medicine

## 2014-09-16 ENCOUNTER — Other Ambulatory Visit: Payer: Self-pay

## 2014-09-16 MED ORDER — PROPRANOLOL HCL 40 MG PO TABS: 20.0000 mg | ORAL_TABLET | Freq: Two times a day (BID) | ORAL | Status: DC

## 2014-10-29 DIAGNOSIS — M7021 Olecranon bursitis, right elbow: Secondary | ICD-10-CM | POA: Diagnosis not present

## 2014-10-29 DIAGNOSIS — M25521 Pain in right elbow: Secondary | ICD-10-CM | POA: Diagnosis not present

## 2014-11-21 ENCOUNTER — Other Ambulatory Visit: Payer: Self-pay | Admitting: Internal Medicine

## 2014-11-22 ENCOUNTER — Other Ambulatory Visit: Payer: Self-pay | Admitting: Emergency Medicine

## 2014-11-22 MED ORDER — LOSARTAN POTASSIUM 100 MG PO TABS: 100.0000 mg | ORAL_TABLET | Freq: Every day | ORAL | Status: DC

## 2015-01-23 ENCOUNTER — Other Ambulatory Visit: Payer: Self-pay | Admitting: Internal Medicine

## 2015-01-24 ENCOUNTER — Other Ambulatory Visit: Payer: Self-pay | Admitting: Emergency Medicine

## 2015-01-24 MED ORDER — PROPRANOLOL HCL 40 MG PO TABS: ORAL_TABLET | ORAL | Status: DC

## 2015-03-09 ENCOUNTER — Telehealth: Payer: Self-pay | Admitting: Family

## 2015-03-09 ENCOUNTER — Encounter: Payer: Self-pay | Admitting: Family

## 2015-03-09 ENCOUNTER — Other Ambulatory Visit (INDEPENDENT_AMBULATORY_CARE_PROVIDER_SITE_OTHER): Payer: Medicare Other

## 2015-03-09 ENCOUNTER — Ambulatory Visit (INDEPENDENT_AMBULATORY_CARE_PROVIDER_SITE_OTHER): Payer: Medicare Other | Admitting: Family

## 2015-03-09 VITALS — BP 140/88 | HR 74 | Temp 97.5°F | Resp 18 | Ht 67.0 in | Wt 171.0 lb

## 2015-03-09 DIAGNOSIS — E785 Hyperlipidemia, unspecified: Secondary | ICD-10-CM

## 2015-03-09 DIAGNOSIS — I10 Essential (primary) hypertension: Secondary | ICD-10-CM

## 2015-03-09 DIAGNOSIS — Z0000 Encounter for general adult medical examination without abnormal findings: Secondary | ICD-10-CM | POA: Insufficient documentation

## 2015-03-09 DIAGNOSIS — Z23 Encounter for immunization: Secondary | ICD-10-CM

## 2015-03-09 DIAGNOSIS — R739 Hyperglycemia, unspecified: Secondary | ICD-10-CM | POA: Diagnosis not present

## 2015-03-09 LAB — LIPID PANEL
Cholesterol: 181 mg/dL (ref 0–200)
HDL: 36.7 mg/dL — AB (ref >39.00)
LDL CALC: 121 mg/dL — AB (ref 0–99)
NonHDL: 144.73
TRIGLYCERIDES: 121 mg/dL (ref 0.0–149.0)
Total CHOL/HDL Ratio: 5
VLDL: 24.2 mg/dL (ref 0.0–40.0)

## 2015-03-09 LAB — COMPREHENSIVE METABOLIC PANEL
ALT: 20 U/L (ref 0–53)
AST: 23 U/L (ref 0–37)
Albumin: 4.2 g/dL (ref 3.5–5.2)
Alkaline Phosphatase: 67 U/L (ref 39–117)
BILIRUBIN TOTAL: 0.7 mg/dL (ref 0.2–1.2)
BUN: 17 mg/dL (ref 6–23)
CHLORIDE: 102 meq/L (ref 96–112)
CO2: 27 meq/L (ref 19–32)
CREATININE: 1.16 mg/dL (ref 0.40–1.50)
Calcium: 10 mg/dL (ref 8.4–10.5)
GFR: 63.55 mL/min (ref >60.00)
GLUCOSE: 121 mg/dL — AB (ref 70–99)
Potassium: 4.3 mEq/L (ref 3.5–5.1)
SODIUM: 137 meq/L (ref 135–145)
Total Protein: 7.4 g/dL (ref 6.0–8.3)

## 2015-03-09 LAB — HEMOGLOBIN A1C: Hgb A1c MFr Bld: 5.8 % (ref 4.6–6.5)

## 2015-03-09 LAB — CBC
HCT: 45 % (ref 39.0–52.0)
Hemoglobin: 15 g/dL (ref 13.0–17.0)
MCHC: 33.3 g/dL (ref 30.0–36.0)
MCV: 84.7 fl (ref 78.0–100.0)
Platelets: 327 10*3/uL (ref 150.0–400.0)
RBC: 5.31 Mil/uL (ref 4.22–5.81)
RDW: 14.4 % (ref 11.5–15.5)
WBC: 9.2 10*3/uL (ref 4.0–10.5)

## 2015-03-09 MED ORDER — FLUTICASONE-SALMETEROL 100-50 MCG/DOSE IN AEPB: 1.0000 | INHALATION_SPRAY | Freq: Two times a day (BID) | RESPIRATORY_TRACT | Status: DC

## 2015-03-09 NOTE — Progress Notes (Signed)
Pre visit review using our clinic review tool, if applicable. No additional management support is needed unless otherwise documented below in the visit note. 

## 2015-03-09 NOTE — Assessment & Plan Note (Addendum)
Reviewed and updated patient's medical, surgical, family and social history. Medications and allergies were also reviewed. Basic screenings for depression, activities of daily living, hearing, cognition and safety were performed. Provider list was updated and health plan was provided to the patient.   Pneumovax updated today. Declines Zostavax. Due for a vision screen which is encouraged to be completed independently. All other screenings are up-to-date per recommendations. Overall well exam with risk factors for cardiovascular disease including hypertension and hyperlipidemia. Chronic conditions are currently managed by medications. Follow-up prevention exam in 1 year. Follow-up office visit pending blood work as needed.

## 2015-03-09 NOTE — Patient Instructions (Addendum)
Thank you for choosing [facility].  Summary/Instructions:  Your prescription(s) have been submitted to your pharmacy or been printed and provided for you. Please take as directed and contact our office if you believe you are having problem(s) with the medication(s) or have any questions.  Please stop by the lab on the basement level of the building for your blood work. Your results will be released to [place] (or called to you) after review, usually within 72 hours after test completion. If any changes need to be made, you will be notified at that same time.  Health Maintenance  Topic Date Due  . ZOSTAVAX  11/07/1989  . PNA vac Low Risk Adult (2 of 2 - PPSV23) 06/23/2015  . INFLUENZA VACCINE  09/20/2015  . TETANUS/TDAP  11/20/2021   Health Maintenance, Male A healthy lifestyle and preventative care can promote health and wellness.  Maintain regular health, dental, and eye exams.  Eat a healthy diet. Foods like vegetables, fruits, whole grains, low-fat dairy products, and lean protein foods contain the nutrients you need and are low in calories. Decrease your intake of foods high in solid fats, added sugars, and salt. Get information about a proper diet from your health care provider, if necessary.  Regular physical exercise is one of the most important things you can do for your health. Most adults should get at least 150 minutes of moderate-intensity exercise (any activity that increases your heart rate and causes you to sweat) each week. In addition, most adults need muscle-strengthening exercises on 2 or more days a week.   Maintain a healthy weight. The body mass index (BMI) is a screening tool to identify possible weight problems. It provides an estimate of body fat based on height and weight. Your health care provider can find your BMI and can help you achieve or maintain a healthy weight. For males 20 years and older:  A BMI below 18.5 is considered underweight.  A BMI of  18.5 to 24.9 is normal.  A BMI of 25 to 29.9 is considered overweight.  A BMI of 30 and above is considered obese.  Maintain normal blood lipids and cholesterol by exercising and minimizing your intake of saturated fat. Eat a balanced diet with plenty of fruits and vegetables. Blood tests for lipids and cholesterol should begin at age 6 and be repeated every 5 years. If your lipid or cholesterol levels are high, you are over age 18, or you are at high risk for heart disease, you may need your cholesterol levels checked more frequently.Ongoing high lipid and cholesterol levels should be treated with medicines if diet and exercise are not working.  If you smoke, find out from your health care provider how to quit. If you do not use tobacco, do not start.  Lung cancer screening is recommended for adults aged 16-80 years who are at high risk for developing lung cancer because of a history of smoking. A yearly low-dose CT scan of the lungs is recommended for people who have at least a 30-pack-year history of smoking and are current smokers or have quit within the past 15 years. A pack year of smoking is smoking an average of 1 pack of cigarettes a day for 1 year (for example, a 30-pack-year history of smoking could mean smoking 1 pack a day for 30 years or 2 packs a day for 15 years). Yearly screening should continue until the smoker has stopped smoking for at least 15 years. Yearly screening should be stopped for  people who develop a health problem that would prevent them from having lung cancer treatment.  If you choose to drink alcohol, do not have more than 2 drinks per day. One drink is considered to be 12 oz (360 mL) of beer, 5 oz (150 mL) of wine, or 1.5 oz (45 mL) of liquor.  Avoid the use of street drugs. Do not share needles with anyone. Ask for help if you need support or instructions about stopping the use of drugs.  High blood pressure causes heart disease and increases the risk of stroke.  High blood pressure is more likely to develop in:  People who have blood pressure in the end of the normal range (100-139/85-89 mm Hg).  People who are overweight or obese.  People who are African American.  If you are 67-79 years of age, have your blood pressure checked every 3-5 years. If you are 17 years of age or older, have your blood pressure checked every year. You should have your blood pressure measured twice--once when you are at a hospital or clinic, and once when you are not at a hospital or clinic. Record the average of the two measurements. To check your blood pressure when you are not at a hospital or clinic, you can use:  An automated blood pressure machine at a pharmacy.  A home blood pressure monitor.  If you are 64-19 years old, ask your health care provider if you should take aspirin to prevent heart disease.  Diabetes screening involves taking a blood sample to check your fasting blood sugar level. This should be done once every 3 years after age 32 if you are at a normal weight and without risk factors for diabetes. Testing should be considered at a younger age or be carried out more frequently if you are overweight and have at least 1 risk factor for diabetes.  Colorectal cancer can be detected and often prevented. Most routine colorectal cancer screening begins at the age of 75 and continues through age 8. However, your health care provider may recommend screening at an earlier age if you have risk factors for colon cancer. On a yearly basis, your health care provider may provide home test kits to check for hidden blood in the stool. A small camera at the end of a tube may be used to directly examine the colon (sigmoidoscopy or colonoscopy) to detect the earliest forms of colorectal cancer. Talk to your health care provider about this at age 58 when routine screening begins. A direct exam of the colon should be repeated every 5-10 years through age 59, unless early forms of  precancerous polyps or small growths are found.  People who are at an increased risk for hepatitis B should be screened for this virus. You are considered at high risk for hepatitis B if:  You were born in a country where hepatitis B occurs often. Talk with your health care provider about which countries are considered high risk.  Your parents were born in a high-risk country and you have not received a shot to protect against hepatitis B (hepatitis B vaccine).  You have HIV or AIDS.  You use needles to inject street drugs.  You live with, or have sex with, someone who has hepatitis B.  You are a man who has sex with other men (MSM).  You get hemodialysis treatment.  You take certain medicines for conditions like cancer, organ transplantation, and autoimmune conditions.  Hepatitis C blood testing is recommended for all  people born from [birth year] through 1963 and any individual with known risk factors for hepatitis C.  Healthy men should no longer receive prostate-specific antigen (PSA) blood tests as part of routine cancer screening. Talk to your health care provider about prostate cancer screening.  Testicular cancer screening is not recommended for adolescents or adult males who have no symptoms. Screening includes self-exam, a health care provider exam, and other screening tests. Consult with your health care provider about any symptoms you have or any concerns you have about testicular cancer.  Practice safe sex. Use condoms and avoid high-risk sexual practices to reduce the spread of sexually transmitted infections (STIs).  You should be screened for STIs, including gonorrhea and chlamydia if:  You are sexually active and are younger than 24 years.  You are older than 24 years, and your health care provider tells you that you are at risk for this type of infection.  Your sexual activity has changed since you were last screened, and you are at an increased risk for chlamydia or  gonorrhea. Ask your health care provider if you are at risk.  If you are at risk of being infected with HIV, it is recommended that you take a prescription medicine daily to prevent HIV infection. This is called pre-exposure prophylaxis (PrEP). You are considered at risk if:  You are a man who has sex with other men (MSM).  You are a heterosexual man who is sexually active with multiple partners.  You take drugs by injection.  You are sexually active with a partner who has HIV.  Talk with your health care provider about whether you are at high risk of being infected with HIV. If you choose to begin PrEP, you should first be tested for HIV. You should then be tested every 3 months for as long as you are taking PrEP.  Use sunscreen. Apply sunscreen liberally and repeatedly throughout the day. You should seek shade when your shadow is shorter than you. Protect yourself by wearing long sleeves, pants, a wide-brimmed hat, and sunglasses year round whenever you are outdoors.  Tell your health care provider of new moles or changes in moles, especially if there is a change in shape or color. Also, tell your health care provider if a mole is larger than the size of a pencil eraser.  A one-time screening for abdominal aortic aneurysm (AAA) and surgical repair of large AAAs by ultrasound is recommended for men aged 21-75 years who are current or former smokers.  Stay current with your vaccines (immunizations).   This information is not intended to replace advice given to you by your health care provider. Make sure you discuss any questions you have with your health care provider.   Document Released: 08/04/2007 Document Revised: 02/26/2014 Document Reviewed: 07/03/2010 Elsevier Interactive Patient Education [employer].

## 2015-03-09 NOTE — Telephone Encounter (Signed)
Please inform patent that his blood work shows that his kidney function, electrolytes, liver function, and white/red blood cells are within the normal limits. His cholesterol is slightly elevated, but no mediation is currently needed. Therefore follow up prevention exam in 1 year.

## 2015-03-09 NOTE — Progress Notes (Signed)
Subjective:    Patient ID: Christopher Richards, male    DOB: 01/21/1928, 82 y.o.   MRN: [id number]  Chief Complaint  Patient presents with  . CPE    fasting, Refilled advair    HPI:  Christopher Richards is a 82 y.o. male who presents today for an annual wellness visit.   1) Health Maintenance -   Diet - Averages about 3 meals per day consisting of fruit, vegetables, chicken, beef and pork. Caffeine intake of 2-3 cups per day.   Exercise - Very little physical activity. Tries to walk 25-45 minutes daily.   2) Preventative Exams / Immunizations:  Dental -- Due for exam   Vision -- Up to date   Health Maintenance  Topic Date Due  . ZOSTAVAX  11/07/1989  . PNA vac Low Risk Adult (2 of 2 - PPSV23) 06/23/2015  . INFLUENZA VACCINE  09/20/2015  . TETANUS/TDAP  11/20/2021  Pneumovax    Immunization History  Administered Date(s) Administered  . Influenza-Unspecified 11/24/2013  . Pneumococcal Conjugate-13 06/23/2014  . Tdap 11/21/2011     RISK FACTORS  Tobacco History  Smoking status  . Never Smoker   Smokeless tobacco  . Never Used     Cardiac risk factors: advanced age (older than 46 for men, 65 for women), dyslipidemia, hypertension and male gender.  Depression Screen  Q1: Over the past two weeks, have you felt down, depressed or hopeless? No  Q2: Over the past two weeks, have you felt little interest or pleasure in doing things? No  Have you lost interest or pleasure in daily life? No  Do you often feel hopeless? No  Do you cry easily over simple problems? No  Activities of Daily Living In your present state of health, do you have any difficulty performing the following activities?:  Driving? No Managing money?  No Feeding yourself? No Getting from bed to chair? No Climbing a flight of stairs? No Preparing food and eating?: No Bathing or showering? No Getting dressed: No Getting to the toilet? No Using the toilet: No Moving around from place to place: No In  the past year have you fallen or had a near fall?:No   Home Safety Has smoke detector and wears seat belts. No firearms. No excess sun exposure. Are there smokers in your home (other than you)?  No Do you feel safe at home?  Yes  Hearing Difficulties: No Do you often ask people to speak up or repeat themselves? No Do you experience ringing or noises in your ears? No  Do you have difficulty understanding soft or whispered voices? No    Cognitive Testing  Alert? Yes   Normal Appearance? Yes  Oriented to person? Yes  Place? Yes   Time? Yes  Recall of three objects?  Yes  Can perform simple calculations? Yes  Displays appropriate judgment? Yes  Can read the correct time from a watch face? Yes  Do you feel that you have a problem with memory? No  Do you often misplace items? No   Advanced Directives have been discussed with the patient? POA, Living Will and Trust.  Current Physicians/Providers and Suppliers  1. Terri Piedra, FNP - Primary Care    Indicate any recent Medical Services you may have received from other than [facility] providers in the past year (date may be approximate).  All answers were reviewed with the patient and necessary referrals were made:  Mauricio Po, [place]   03/09/2015    Review  of Systems  Constitutional: Denies fever, chills, fatigue, or significant weight gain/loss. HENT: Head: Denies headache or neck pain Ears: Denies changes in hearing, ringing in ears, earache, drainage Nose: Denies discharge, stuffiness, itching, nosebleed, sinus pain Throat: Denies sore throat, hoarseness, dry mouth, sores, thrush Eyes: Denies loss/changes in vision, pain, redness, blurry/double vision, flashing lights Cardiovascular: Denies chest pain/discomfort, tightness, palpitations, shortness of breath with activity, difficulty lying down, swelling, sudden awakening with shortness of breath Respiratory: Denies shortness of breath, cough, sputum production,  wheezing Gastrointestinal: Denies dysphasia, heartburn, change in appetite, nausea, change in bowel habits, rectal bleeding, constipation, diarrhea, yellow skin or eyes Genitourinary: Denies frequency, urgency, burning/pain, blood in urine, incontinence, change in urinary strength. Musculoskeletal: Denies muscle/joint pain, stiffness, back pain, redness or swelling of joints, trauma Skin: Denies rashes, lumps, itching, dryness, color changes, or hair/nail changes Neurological: Denies dizziness, fainting, seizures, weakness, numbness, tingling, tremor Psychiatric - Denies nervousness, stress, depression or memory loss Endocrine: Denies heat or cold intolerance, sweating, frequent urination, excessive thirst, changes in appetite Hematologic: Denies ease of bruising or bleeding    Objective:     BP 140/88 mmHg  Pulse 74  Temp(Src) 97.5 F (36.4 C) (Oral)  Resp 18  Ht 5\' 7"  (1.702 m)  Wt 171 lb (77.565 kg)  BMI 26.78 kg/m2  SpO2 95% Nursing note and vital signs reviewed.  Physical Exam  Constitutional: He is oriented to person, place, and time. He appears well-developed and well-nourished. No distress.  Cardiovascular: Normal rate, regular rhythm, normal heart sounds and intact distal pulses.   Pulmonary/Chest: Effort normal and breath sounds normal.  Neurological: He is alert and oriented to person, place, and time.  Skin: Skin is warm and dry.  Psychiatric: He has a normal mood and affect. His behavior is normal. Judgment and thought content normal.       Assessment & Plan:   During the course of the visit the patient was educated and counseled about appropriate screening and preventive services including:    Pneumococcal vaccine   Influenza vaccine  Td vaccine  Colorectal cancer screening  Diabetes screening  Nutrition counseling   Diet review for nutrition referral? Yes ____  Not Indicated _X___   Patient Instructions (the written plan) was given to the  patient.  Medicare Attestation I have personally reviewed: The patient's medical and social history Their use of alcohol, tobacco or illicit drugs Their current medications and supplements The patient's functional ability including ADLs,fall risks, home safety risks, cognitive, and hearing and visual impairment Diet and physical activities Evidence for depression or mood disorders  The patient's weight, height, BMI,  have been recorded in the chart.  I have made referrals, counseling, and provided education to the patient based on review of the above and I have provided the patient with a written personalized care plan for preventive services.     Mauricio Po, [place]   03/09/2015    Problem List Items Addressed This Visit      Cardiovascular and Mediastinum   Essential hypertension   Relevant Orders   CBC   Comprehensive metabolic panel     Other   Hyperlipidemia   Relevant Orders   Lipid panel   Hyperglycemia   Relevant Orders   Hemoglobin A1c   Medicare annual wellness visit, subsequent - Primary    Reviewed and updated patient's medical, surgical, family and social history. Medications and allergies were also reviewed. Basic screenings for depression, activities of daily living, hearing, cognition and safety were performed.  Provider list was updated and health plan was provided to the patient.   Pneumovax updated today. Declines Zostavax. Due for a vision screen which is encouraged to be completed independently. All other screenings are up-to-date per recommendations. Overall well exam with risk factors for cardiovascular disease including hypertension and hyperlipidemia. Chronic conditions are currently managed by medications. Follow-up prevention exam in 1 year. Follow-up office visit pending blood work as needed.

## 2015-03-14 NOTE — Telephone Encounter (Signed)
Pt aware of results 

## 2015-04-08 ENCOUNTER — Ambulatory Visit (INDEPENDENT_AMBULATORY_CARE_PROVIDER_SITE_OTHER): Payer: Medicare Other | Admitting: Nurse Practitioner

## 2015-04-08 ENCOUNTER — Encounter: Payer: Self-pay | Admitting: Nurse Practitioner

## 2015-04-08 VITALS — BP 172/72 | HR 74 | Temp 98.3°F | Resp 18 | Wt 172.0 lb

## 2015-04-08 DIAGNOSIS — J069 Acute upper respiratory infection, unspecified: Secondary | ICD-10-CM | POA: Insufficient documentation

## 2015-04-08 DIAGNOSIS — B9789 Other viral agents as the cause of diseases classified elsewhere: Principal | ICD-10-CM

## 2015-04-08 MED ORDER — AZITHROMYCIN 250 MG PO TABS: ORAL_TABLET | ORAL | Status: DC

## 2015-04-08 NOTE — Patient Instructions (Signed)
Continue current over the counter regimen   Take the Z-pack if worsening cough, green sputum, temperature of 100.5 or greater.   Call us if not better in 7-10 days or with the z-pack.

## 2015-04-08 NOTE — Progress Notes (Signed)
Patient ID: Christopher Richards, male    DOB: 10-13-1927  Age: 82 y.o. MRN: [id number]  CC: Cough   HPI Christopher Richards presents for CC of cough x 2 days.   1) Pt reports productive cough- yellow sputum  Drainage from nose- yellow  Denies sneezing, fever, ST  Treatment to date: Zyrtec  Flonase x 2 days  Cough syrup- mucinex   Sick contacts: Denies  History Christopher Richards has a past medical history of Chest pain (1992); Hyperglycemia; Hyperlipidemia; Benign prostatic hypertrophy; Basal cell cancer; Pneumonia (2009); Hypertension; Bronchitis; Arthritis; and Tachycardia.   Christopher Richards has past surgical history that includes Excision basal cell carcinoma; Hernia repair (1972); Colonoscopy (2003); cataract surgery ; Inguinal hernia repair (Right, 04/06/2014); and Insertion of mesh (Right, 04/06/2014).   Christopher Richards family history includes Alcohol abuse in Christopher Richards brother; Heart attack (age of onset: 96) in Christopher Richards father; Heart disease in Christopher Richards mother; Hypertension in Christopher Richards father and mother; Lung disease in Christopher Richards father; Transient ischemic attack in Christopher Richards mother. There is no history of Cancer or Diabetes.Christopher Richards reports that Christopher Richards has never smoked. Christopher Richards has never used smokeless tobacco. Christopher Richards reports that Christopher Richards does not drink alcohol or use illicit drugs.  Outpatient Prescriptions Prior to Visit  Medication Sig Dispense Refill  . Cholecalciferol (VITAMIN D) 2000 UNITS tablet Take 2,000 Units by mouth daily.    . Fluticasone-Salmeterol (ADVAIR) 100-50 MCG/DOSE AEPB Inhale 1 puff into the lungs 2 (two) times daily. 60 each 2  . losartan (COZAAR) 100 MG tablet TAKE 1 TABLET DAILY 90 tablet 1  . Multiple Vitamin (MULTIVITAMIN WITH MINERALS) TABS tablet Take 1 tablet by mouth daily.    . Multiple Vitamins-Minerals (EMERGEN-C VITAMIN C PO) Take 1 tablet by mouth daily.     . propranolol (INDERAL) 40 MG tablet Take 0.5 tablets (20 mg total) by mouth 2 (two) times daily. 60 tablet 1   No facility-administered medications prior to visit.    ROS Review of Systems   Constitutional: Negative for fever, chills, diaphoresis and fatigue.  HENT: Positive for congestion, postnasal drip and rhinorrhea. Negative for ear discharge, ear pain, sinus pressure, sneezing, sore throat, tinnitus, trouble swallowing and voice change.   Eyes: Negative for visual disturbance.  Respiratory: Positive for cough. Negative for chest tightness, shortness of breath and wheezing.   Cardiovascular: Negative for chest pain, palpitations and leg swelling.  Gastrointestinal: Negative for nausea, vomiting and diarrhea.  Skin: Negative for rash.  Neurological: Negative for dizziness.    Objective:  BP 172/72 mmHg  Pulse 74  Temp(Src) 98.3 F (36.8 C) (Oral)  Resp 18  Wt 172 lb (78.019 kg)  SpO2 97%  Physical Exam  Constitutional: Christopher Richards is oriented to person, place, and time. Christopher Richards appears well-developed and well-nourished. No distress.  HENT:  Head: Normocephalic and atraumatic.  Right Ear: External ear normal.  Left Ear: External ear normal.  Mouth/Throat: Oropharynx is clear and moist.  TM's clear bilaterally  Eyes: EOM are normal. Pupils are equal, round, and reactive to light. Right eye exhibits no discharge. Left eye exhibits no discharge. No scleral icterus.  Neck: Normal range of motion. Neck supple.  Cardiovascular: Normal rate, regular rhythm and normal heart sounds.  Exam reveals no gallop and no friction rub.   No murmur heard. Pulmonary/Chest: Effort normal and breath sounds normal. No respiratory distress. Christopher Richards has no wheezes. Christopher Richards has no rales. Christopher Richards exhibits no tenderness.  Lymphadenopathy:    Christopher Richards has no cervical adenopathy.  Neurological: Christopher Richards is alert and oriented  to person, place, and time.  Skin: Skin is warm and dry. No rash noted. Christopher Richards is not diaphoretic.  Psychiatric: Christopher Richards has a normal mood and affect. Christopher Richards behavior is normal. Judgment and thought content normal.   Assessment & Plan:   Ethanjacob was seen today for cough.  Diagnoses and all orders for this visit:  Viral  URI with cough  Other orders -     azithromycin (ZITHROMAX) 250 MG tablet; Take 2 tablets by mouth on day 1, take 1 tablet by mouth each day after for 4 days.  I am having Christopher Richards start on azithromycin. I am also having him maintain Christopher Richards multivitamin with minerals, Vitamin D, Multiple Vitamins-Minerals (EMERGEN-C VITAMIN C PO), propranolol, losartan, and Fluticasone-Salmeterol.  Meds ordered this encounter  Medications  . azithromycin (ZITHROMAX) 250 MG tablet    Sig: Take 2 tablets by mouth on day 1, take 1 tablet by mouth each day after for 4 days.    Dispense:  6 each    Refill:  0    Order Specific Question:  Supervising Provider    Answer:  Crecencio Mc [[id number]]     Follow-up: Return if symptoms worsen or fail to improve.

## 2015-04-08 NOTE — Assessment & Plan Note (Signed)
New onset Pt appears well today, no sign of bronchitis on exam  Pt would like to have abx script just in case of worsening over the weekend.  Z-pack sent to pharmacy with when to fill signs discussed  Pt verbalized understanding  Will follow

## 2015-04-08 NOTE — Progress Notes (Signed)
Pre visit review using our clinic review tool, if applicable. No additional management support is needed unless otherwise documented below in the visit note. 

## 2015-04-15 ENCOUNTER — Telehealth: Payer: Self-pay | Admitting: Family

## 2015-04-15 NOTE — Telephone Encounter (Signed)
[place] [facility] - Client [facility] Patient Name: WOODROW SALON DOB: 03-Oct-1927 Initial Comment Caller states he has hx of lung trouble, bronchitis, pneumonia, is on advair and mucinex. Finished z-pack today, still having dark thick mucus, no trouble breathing. Some trouble swallowing. Nurse Assessment Nurse: Mechele Dawley, RN, Amy Date/Time Eilene Ghazi Time): 04/15/2015 11:37:43 AM Confirm and document reason for call. If symptomatic, describe symptoms. You must click the next button to save text entered. ---CALLER STATES THAT HE WAS SEEN BY THE MD LAST FRIDAY A WEEK AGO. HE STARTED TAKING THE ZPACK ON MONDAY. HE IS STILL HAVING A COUGH. HE IS TAKING MUCINEX AND ADVAIR. HE WENT FROM YELLOW TO GREEN SECRETIONS, MUCOUS IS THICKENING UP ON HIM. HE IS NOT SURE WHAT HE NEED TO DO. HE HAS SOME FLONASE AS WELL. HE THOUGHT HE HAD ALLERGIES. HE IS WHEEZING. Has the patient traveled out of the country within the last 30 days? ---Not Applicable Does the patient have any new or worsening symptoms? ---Yes Will a triage be completed? ---Yes Related visit to physician within the last 2 weeks? ---Yes Does the PT have any chronic conditions? (i.e. diabetes, asthma, etc.) ---Yes List chronic conditions. ---SEE EPIC Is this a behavioral health or substance abuse call? ---No Guidelines Guideline Title Affirmed Question Affirmed Notes Sinus Infection on Antibiotic Follow-up Call [1] SEVERE pain AND [2] not improved 2 hours after pain medicine Final Disposition User See Physician within 4 Hours (or PCP triage) [place], RN, Amy Comments ATTEMPTED TO SCHEDULE PT FOR APPT, NO APPT'S ARE AVAILABLE. DISCUSSED WITH THE PATIENT SAT AT [facility]. THE SCHEDULE HAS NOT BEEN POSTED, INFORMED HIM THAT HE WILL NEED TO CALL BACK AFTER AROUND 1 PM AND SEE ABOUT AN APPT. HE WAS INSTRUCTED IF HE HAS ANY ACUTE DISTRESS TO GO INTO THE URGENT CARE OR  ED. Referrals [facility] PLEASE NOTE:

## 2015-04-16 ENCOUNTER — Encounter: Payer: Self-pay | Admitting: Internal Medicine

## 2015-04-16 ENCOUNTER — Ambulatory Visit (INDEPENDENT_AMBULATORY_CARE_PROVIDER_SITE_OTHER): Payer: Medicare Other | Admitting: Internal Medicine

## 2015-04-16 VITALS — BP 150/80 | HR 78 | Temp 98.2°F | Resp 18 | Ht 67.0 in | Wt 170.8 lb

## 2015-04-16 DIAGNOSIS — I10 Essential (primary) hypertension: Secondary | ICD-10-CM | POA: Diagnosis not present

## 2015-04-16 DIAGNOSIS — J069 Acute upper respiratory infection, unspecified: Secondary | ICD-10-CM | POA: Diagnosis not present

## 2015-04-16 DIAGNOSIS — B9789 Other viral agents as the cause of diseases classified elsewhere: Secondary | ICD-10-CM

## 2015-04-16 MED ORDER — BENZONATATE 100 MG PO CAPS: 100.0000 mg | ORAL_CAPSULE | Freq: Two times a day (BID) | ORAL | Status: DC | PRN

## 2015-04-16 NOTE — Progress Notes (Signed)
Pre-visit discussion using our clinic review tool. No additional management support is needed unless otherwise documented below in the visit note.  

## 2015-04-16 NOTE — Progress Notes (Signed)
Subjective:    Patient ID: Christopher Richards, male    DOB: January 27, 1928, 82 y.o.   MRN: [id number]  HPI   82 year old patient, his medical problems include COPD and essential hypertension.  He has been on maintenance Advair. He has been ill for about 8 days with the onset of cough and cold symptoms with rhinorrhea.  He was seen and initially treated symptomatically but was given a Z-Pak prescription to take if he developed any clinical worsening.  5 days ago he had worsening cough and has completed the azithromycin. Denies any fever, chills or shortness of breath Predominant symptoms include persistent cough and rhinorrhea  Past Medical History  Diagnosis Date  . Chest pain 1992    NEGATIVE CATH  . Hyperglycemia     PMH of  . Hyperlipidemia   . Benign prostatic hypertrophy   . Basal cell cancer     SKIN.Marland KitchenFACE AND HAND  . Pneumonia 2009    treated as OP  . Hypertension   . Bronchitis     11/2013   . Arthritis   . Tachycardia     hx of per patient     Social History   Social History  . Marital Status: Married    Spouse Name: N/A  . Number of Children: 2  . Years of Education: 18   Occupational History  . [employer]    Social History Main Topics  . Smoking status: Never Smoker   . Smokeless tobacco: Never Used  . Alcohol Use: No  . Drug Use: No  . Sexual Activity: Not on file   Other Topics Concern  . Not on file   Social History Narrative   Denies abuse and feels safe at home.    Past Surgical History  Procedure Laterality Date  . Basal cell carcinoma excision      X 3  . Hernia repair  1972    Dr  Marylene Buerger  . Colonoscopy  2003    [facility]  . Cataract surgery       bilateral  . Inguinal hernia repair Right 04/06/2014    Procedure: REPAIR RIGHT INGUINAL HERNIA WITH MESH;  Surgeon: Jackolyn Confer, MD;  Location: [facility];  Service: General;  Laterality: Right;  . Insertion of mesh Right 04/06/2014    Procedure: INSERTION OF MESH;  Surgeon: Jackolyn Confer,  MD;  Location: [facility];  Service: General;  Laterality: Right;    Family History  Problem Relation Age of Onset  . Hypertension Mother   . Heart disease Mother     AF  . Transient ischemic attack Mother   . Heart attack Father 57  . Hypertension Father   . Lung disease Father     ? [facility] visitor)  . Alcohol abuse Brother   . Cancer Neg Hx   . Diabetes Neg Hx     Allergies  Allergen Reactions  . Amlodipine Other (See Comments)    Ankle edema    Current Outpatient Prescriptions on File Prior to Visit  Medication Sig Dispense Refill  . Cholecalciferol (VITAMIN D) 2000 UNITS tablet Take 2,000 Units by mouth daily.    . Fluticasone-Salmeterol (ADVAIR) 100-50 MCG/DOSE AEPB Inhale 1 puff into the lungs 2 (two) times daily. 60 each 2  . losartan (COZAAR) 100 MG tablet TAKE 1 TABLET DAILY 90 tablet 1  . Multiple Vitamin (MULTIVITAMIN WITH MINERALS) TABS tablet Take 1 tablet by mouth daily.    . Multiple Vitamins-Minerals (EMERGEN-C VITAMIN  C PO) Take 1 tablet by mouth daily.     . propranolol (INDERAL) 40 MG tablet Take 0.5 tablets (20 mg total) by mouth 2 (two) times daily. 60 tablet 1   No current facility-administered medications on file prior to visit.    BP 150/80 mmHg  Pulse 78  Temp(Src) 98.2 F (36.8 C) (Oral)  Resp 18  Ht 5\' 7"  (1.702 m)  Wt 170 lb 12 oz (77.452 kg)  BMI 26.74 kg/m2  SpO2 96%     Review of Systems  Constitutional: Positive for activity change, appetite change and fatigue. Negative for fever and chills.  HENT: Positive for congestion and postnasal drip. Negative for dental problem, ear pain, hearing loss, sore throat, tinnitus, trouble swallowing and voice change.   Eyes: Negative for pain, discharge and visual disturbance.  Respiratory: Positive for cough. Negative for chest tightness, shortness of breath, wheezing and stridor.   Cardiovascular: Negative for chest pain, palpitations and leg swelling.  Gastrointestinal: Negative for  nausea, vomiting, abdominal pain, diarrhea, constipation, blood in stool and abdominal distention.  Genitourinary: Negative for urgency, hematuria, flank pain, discharge, difficulty urinating and genital sores.  Musculoskeletal: Negative for myalgias, back pain, joint swelling, arthralgias, gait problem and neck stiffness.  Skin: Negative for rash.  Neurological: Negative for dizziness, syncope, speech difficulty, weakness, numbness and headaches.  Hematological: Negative for adenopathy. Does not bruise/bleed easily.  Psychiatric/Behavioral: Negative for behavioral problems and dysphoric mood. The patient is not nervous/anxious.        Objective:   Physical Exam  Constitutional: He is oriented to person, place, and time. He appears well-developed.  HENT:  Head: Normocephalic.  Right Ear: External ear normal.  Left Ear: External ear normal.  Eyes: Conjunctivae and EOM are normal.  Neck: Normal range of motion.  Cardiovascular: Normal rate, regular rhythm and normal heart sounds.   No tachycardia  Pulmonary/Chest: No respiratory distress. He has no wheezes. He has no rales.  O2 saturation 96% Generally diminished breath sounds No wheezing No increased work of breathing  Abdominal: Bowel sounds are normal.  Musculoskeletal: Normal range of motion. He exhibits no edema or tenderness.  Neurological: He is alert and oriented to person, place, and time.  Psychiatric: He has a normal mood and affect. His behavior is normal.          Assessment & Plan:      Viral URI with cough.  Will continue symptomatic treatment and maintenance Advair.   Will treat with Mucinex DM.  Hydration   will call if he develops any shortness of breath, wheezing or fever

## 2015-04-16 NOTE — Patient Instructions (Signed)
Take cough medicine as directed   Take over-the-counter expectorants and cough medications such as  Mucinex DM.  Call if there is no improvement in 5 to 7 days or if  you develop worsening cough, fever, or new symptoms, such as shortness of breath or chest pain.  HOME CARE INSTRUCTIONS  Drink plenty of water. Water helps thin the mucus so your sinuses can drain more easily.  Use a humidifier.  Inhale steam 3-4 times a day (for example, sit in the bathroom with the shower running).  Apply a warm, moist washcloth to your face 3-4 times a day, or as directed by your health care provider.  Use saline nasal sprays to help moisten and clean your sinuses.  Take medicines only as directed by your health care provider.   Call or return to clinic prn if these symptoms worsen or fail to improve as anticipated.

## 2015-04-18 ENCOUNTER — Telehealth: Payer: Self-pay | Admitting: Internal Medicine

## 2015-04-18 MED ORDER — NYSTATIN 100000 UNIT/ML MT SUSP: 5.0000 mL | Freq: Four times a day (QID) | OROMUCOSAL | Status: DC

## 2015-04-18 NOTE — Telephone Encounter (Signed)
Patient Name: Christopher Richards DOB: 1927-10-28 Initial Comment Caller states, was seen last week for a cough, was given a Zpack, was seen on Sat clinic, was given Tessalon and Mucinex DM, his cough is better, but this morning he has blisters on his tongue. Nurse Assessment Nurse: Marcelline Deist, RN, Lynda Date/Time (Eastern Time): 04/18/2015 10:48:12 AM Confirm and document reason for call. If symptomatic, describe symptoms. You must click the next button to save text entered. ---Caller states he was seen last week for a cough, was given a Z pack. Seen again on Sat. at the clinic, was given Tessalon and Mucinex DM. His cough is better, but this morning he has blisters on his tongue. No fever. Has the patient traveled out of the country within the last 30 days? ---Not Applicable Does the patient have any new or worsening symptoms? ---Yes Will a triage be completed? ---Yes Related visit to physician within the last 2 weeks? ---Yes Does the PT have any chronic conditions? (i.e. diabetes, asthma, etc.) ---Yes List chronic conditions. ---on Advair, on BP rx. Is this a behavioral health or substance abuse call? ---No Guidelines Guideline Title Affirmed Question Affirmed Notes Mouth Symptoms All other mouth symptoms (Exceptions: dry mouth from not drinking enough liquids, chapped lips) Final Disposition User See PCP within [place], RN, Kermit Balo Comments Caller states overall, he has improved with the URI. His concern is about the blisters on his tongue, around the edges and tip of tongue. He states he has been drinking more juice in recent days, could that be causing this? Nurse gave general advice for mouth irritation/ blisters, that he can continue to rinse with warm salt water, or try a liquid antacid on tongue for the irritation. Also gave general exit care advice for cough - honey, warm fluids, humidifier, steam, etc. Please contact caller at this #. They would like him to get in  with Dr. Raliegh Ip if he is taking new patients as he doesn't have a PCP after Dr. Linna Darner. Caller states they were very pleased with the care received on Sat. At the clinic with Dr. Raliegh Ip. Referrals REFERRED TO PCP OFFICE Disagree/Comply: Comply

## 2015-04-18 NOTE — Telephone Encounter (Signed)
Spoke to pt, told him Dr.K said to stop Mucinex and Tessalon capsules. Rx sent to pharmacy for Nystatin Solution to help your mouth, take 1 teaspoon 4 times a day. Pt verbalized understanding. Also told pt make sure you rinse your mouth out well after using Advair Inhaler. Told pt to call if no improvement. Pt verbalized understanding.

## 2015-04-18 NOTE — Telephone Encounter (Signed)
Please see message. °

## 2015-04-18 NOTE — Telephone Encounter (Signed)
Asked patient to discontinue Mucinex and Tessalon Make sure patient rinses his mouth out well after Advair use Please call in a new prescription for nystatin suspension 100,000 units per cc 1 teaspoon 4 times a day

## 2015-04-22 NOTE — Telephone Encounter (Signed)
Patient needs follow-up with his PCP

## 2015-04-22 NOTE — Telephone Encounter (Signed)
Pt saw you at [facility], and ask to follow up with you for this issues, since you put him on this med.  Advised pt you are not accepting any new pts,   and he verbalized understanding. However, insisted he would like to see you one more time for his main issue. Is it ok?

## 2015-04-24 ENCOUNTER — Other Ambulatory Visit: Payer: Self-pay | Admitting: Internal Medicine

## 2015-04-25 ENCOUNTER — Ambulatory Visit: Payer: Medicare Other | Admitting: Internal Medicine

## 2015-04-26 ENCOUNTER — Ambulatory Visit (INDEPENDENT_AMBULATORY_CARE_PROVIDER_SITE_OTHER): Payer: Medicare Other | Admitting: Internal Medicine

## 2015-04-26 ENCOUNTER — Encounter: Payer: Self-pay | Admitting: Internal Medicine

## 2015-04-26 VITALS — BP 170/92 | HR 82 | Temp 98.1°F | Resp 18 | Wt 169.0 lb

## 2015-04-26 DIAGNOSIS — J069 Acute upper respiratory infection, unspecified: Secondary | ICD-10-CM | POA: Diagnosis not present

## 2015-04-26 DIAGNOSIS — I10 Essential (primary) hypertension: Secondary | ICD-10-CM

## 2015-04-26 DIAGNOSIS — B370 Candidal stomatitis: Secondary | ICD-10-CM | POA: Diagnosis not present

## 2015-04-26 NOTE — Progress Notes (Signed)
Subjective:    Patient ID: Christopher Richards, male    DOB: 11-25-1927, 82 y.o.   MRN: [id number]  HPI  He is here for a cough.  On 2/17 he saw Morey Hummingbird and was diagnosed with a viral URI.  He had been taking mucinex bid, advair 100/50, and flonase chronically and she advised that he continue these.  She gave him a zpak in case there was worsening or no improvement.  His yellow phlegm turned bright green and he took the zpak.  He did not feel better and was seen by Dr. Raliegh Ip a couple of saturdays ago.  He was prescribed tessalon perles and mucinex syrup.  He took those sat and sun.  He felt like he was getting better.  Monday of last week he woke up with an irritated, red tongue.  He called Dr. Marthann Schiller office and was advised to stop tessalon and mucinex. He was started on oral nystatin.  He is not sure if his tongue is better or not.    His tongue feels a little irritated in 2-3 spots. He denies pain, burning sensation, white plaque or difficulty swallowing and food tastes normal.  He took the nystatin for one week and stopped it yesterday.    He is still using adviar and mucinex.  The cough is a little better.  The cough is productive of yellow phlegm and it is still thick.  No wheeze or sob.  He feels he has gotten a little better in the past 2-3 days.  He denies fever.      Medications and allergies reviewed with patient and updated if appropriate.  Patient Active Problem List   Diagnosis Date Noted  . Medicare annual wellness visit, subsequent 03/09/2015  . Inguinal hernia 03/23/2014  . Essential hypertension 04/01/2008  . AORTIC INSUFFICIENCY, MILD 04/01/2008  . Hyperglycemia 04/01/2008  . SKIN CANCER, HX OF 04/01/2008  . Hyperlipidemia 06/26/2006  . BENIGN PROSTATIC HYPERTROPHY 06/26/2006    Current Outpatient Prescriptions on File Prior to Visit  Medication Sig Dispense Refill  . Cholecalciferol (VITAMIN D) 2000 UNITS tablet Take 2,000 Units by mouth daily.    . Fluticasone-Salmeterol  (ADVAIR) 100-50 MCG/DOSE AEPB Inhale 1 puff into the lungs 2 (two) times daily. 60 each 2  . losartan (COZAAR) 100 MG tablet TAKE 1 TABLET DAILY 90 tablet 1  . Multiple Vitamin (MULTIVITAMIN WITH MINERALS) TABS tablet Take 1 tablet by mouth daily.    . Multiple Vitamins-Minerals (EMERGEN-C VITAMIN C PO) Take 1 tablet by mouth daily.     Marland Kitchen nystatin (MYCOSTATIN) 100000 UNIT/ML suspension Take 5 mLs (500,000 Units total) by mouth 4 (four) times daily. X 7 days 180 mL 0  . propranolol (INDERAL) 40 MG tablet Take 0.5 tablets (20 mg total) by mouth 2 (two) times daily. 60 tablet 1   No current facility-administered medications on file prior to visit.    Past Medical History  Diagnosis Date  . Chest pain 1992    NEGATIVE CATH  . Hyperglycemia     PMH of  . Hyperlipidemia   . Benign prostatic hypertrophy   . Basal cell cancer     SKIN.Marland KitchenFACE AND HAND  . Pneumonia 2009    treated as OP  . Hypertension   . Bronchitis     11/2013   . Arthritis   . Tachycardia     hx of per patient     Past Surgical History  Procedure Laterality Date  . Basal cell carcinoma  excision      X 3  . Hernia repair  1972    Dr  Marylene Buerger  . Colonoscopy  2003    [facility]  . Cataract surgery       bilateral  . Inguinal hernia repair Right 04/06/2014    Procedure: REPAIR RIGHT INGUINAL HERNIA WITH MESH;  Surgeon: Jackolyn Confer, MD;  Location: [facility];  Service: General;  Laterality: Right;  . Insertion of mesh Right 04/06/2014    Procedure: INSERTION OF MESH;  Surgeon: Jackolyn Confer, MD;  Location: [facility];  Service: General;  Laterality: Right;    Social History   Social History  . Marital Status: Married    Spouse Name: N/A  . Number of Children: 2  . Years of Education: 18   Occupational History  . [employer]    Social History Main Topics  . Smoking status: Never Smoker   . Smokeless tobacco: Never Used  . Alcohol Use: No  . Drug Use: No  . Sexual Activity: Not on file   Other Topics  Concern  . Not on file   Social History Narrative   Denies abuse and feels safe at home.    Family History  Problem Relation Age of Onset  . Hypertension Mother   . Heart disease Mother     AF  . Transient ischemic attack Mother   . Heart attack Father 11  . Hypertension Father   . Lung disease Father     ? [facility] visitor)  . Alcohol abuse Brother   . Cancer Neg Hx   . Diabetes Neg Hx     Review of Systems  Constitutional: Negative for fever and appetite change.  HENT: Positive for postnasal drip. Negative for congestion, ear pain, sinus pressure and sore throat.   Respiratory: Positive for cough. Negative for shortness of breath and wheezing.   Cardiovascular: Negative for chest pain.  Neurological: Negative for light-headedness and headaches.       Objective:   Filed Vitals:   04/26/15 1434  BP: 170/92  Pulse: 82  Temp: 98.1 F (36.7 C)  Resp: 18   Filed Weights   04/26/15 1434  Weight: 169 lb (76.658 kg)   Body mass index is 26.46 kg/(m^2).   Physical Exam GENERAL APPEARANCE: Appears stated age, well appearing, NAD EYES: conjunctiva clear, no icterus HEENT: bilateral tympanic membranes and ear canals normal, oropharynx and tongue with mild erythema, no exudate or white plaque, no thyromegaly, trachea midline, no cervical or supraclavicular lymphadenopathy LUNGS: Clear to auscultation without wheeze or crackles, unlabored breathing, good air entry bilaterally HEART: Normal S1,S2 without murmurs EXTREMITIES: Without clubbing, cyanosis, or edema     Assessment & Plan:   Bacterial bronchitis, thrush Possible slight residual thrush -- he will use the nystatin for three more days and then stop - he will call if no improvement bacterial bronchitis is improving - we will hold off on additional antibiotics for now, but will start one if there is no continued improvement or any worsening - he will call Continue advair and mucinex  BP is elevated  here today Typically better controlled Advised a nurse visit to recheck BP - will bring home cuff

## 2015-04-26 NOTE — Progress Notes (Signed)
Pre visit review using our clinic review tool, if applicable. No additional management support is needed unless otherwise documented below in the visit note. 

## 2015-04-26 NOTE — Patient Instructions (Addendum)
Continue the nystatin for three additional days.    Continue your current medications for your cough - if your symptoms do not continue to improve or worsen please call.   Monitor your BP at home.  Schedule a nurse visit to have your blood pressure rechecked and bring your BP cuff with you.

## 2015-04-28 ENCOUNTER — Ambulatory Visit: Payer: Medicare Other | Admitting: *Deleted

## 2015-04-28 VITALS — BP 208/100

## 2015-04-28 DIAGNOSIS — I10 Essential (primary) hypertension: Secondary | ICD-10-CM

## 2015-04-28 MED ORDER — HYDROCHLOROTHIAZIDE 25 MG PO TABS: 25.0000 mg | ORAL_TABLET | ORAL | Status: DC

## 2015-04-28 NOTE — Progress Notes (Signed)
   Subjective:    Patient ID: Christopher Richards, male    DOB: 1927/03/18, 82 y.o.   MRN: [id number]  HPI Patient is here for nurse visit for blood pressure check. He denies dizziness, SOB, HA, CP, or vision changes. He is going to take Coricidin HBP at home for his congestion.    Dr. Quay Burow advised of elevated reading in office today.  Review of Systems     Objective:   Physical Exam        Assessment & Plan:    Dr. Quay Burow is adding HCTZ 25 mg 1 tab by mouth every morning to his current medications. See meds. She wants to see him back in the office the first of next week. He is to continue checking BP at home daily and bring record with him. He will call us sooner if needed.

## 2015-05-05 ENCOUNTER — Encounter: Payer: Self-pay | Admitting: Family

## 2015-05-05 ENCOUNTER — Ambulatory Visit (INDEPENDENT_AMBULATORY_CARE_PROVIDER_SITE_OTHER): Payer: Medicare Other | Admitting: Family

## 2015-05-05 VITALS — BP 142/94 | HR 69 | Temp 97.7°F | Resp 14 | Ht 67.0 in | Wt 167.0 lb

## 2015-05-05 DIAGNOSIS — J209 Acute bronchitis, unspecified: Secondary | ICD-10-CM

## 2015-05-05 DIAGNOSIS — I10 Essential (primary) hypertension: Secondary | ICD-10-CM | POA: Diagnosis not present

## 2015-05-05 MED ORDER — HYDROCHLOROTHIAZIDE 25 MG PO TABS: 25.0000 mg | ORAL_TABLET | ORAL | Status: DC

## 2015-05-05 NOTE — Progress Notes (Signed)
Subjective:    Patient ID: Christopher Richards, male    DOB: [birth year], 82 y.o.   MRN: [id number]  Chief Complaint  Patient presents with  . Follow-up    on BP and URI    HPI:  Christopher Richards is a 82 y.o. male who  has a past medical history of Chest pain (1992); Hyperglycemia; Hyperlipidemia; Benign prostatic hypertrophy; Basal cell cancer; Pneumonia (2009); Hypertension; Bronchitis; Arthritis; and Tachycardia. and presents today for a follow up office visit.   1.) Blood pressure - Currently maintained on hydrochlorothiazide, losartan, propranolol. Reports taking the medication as prescribed and notes that he did have some muscle cramping initially, however it has since resolved. Home readings are averaging have been below 150/90.   BP Readings from Last 3 Encounters:  05/05/15 142/94  04/28/15 208/100  04/26/15 170/92   2.) Cough - Previously diagnosed with bacterial bronchitis that was treated with azithromycin from which he experienced a burning tongue. He was diagnosed with thrush and treated with tessalon. Cough has improved but still remains. No fevers. Also has some remaining congestion.   Allergies  Allergen Reactions  . Azithromycin Other (See Comments)    Burning in tongue  . Amlodipine Other (See Comments)    Ankle edema     Current Outpatient Prescriptions on File Prior to Visit  Medication Sig Dispense Refill  . Cholecalciferol (VITAMIN D) 2000 UNITS tablet Take 2,000 Units by mouth daily.    . Fluticasone-Salmeterol (ADVAIR) 100-50 MCG/DOSE AEPB Inhale 1 puff into the lungs 2 (two) times daily. 60 each 2  . losartan (COZAAR) 100 MG tablet TAKE 1 TABLET DAILY 90 tablet 1  . Multiple Vitamin (MULTIVITAMIN WITH MINERALS) TABS tablet Take 1 tablet by mouth daily.    . Multiple Vitamins-Minerals (EMERGEN-C VITAMIN C PO) Take 1 tablet by mouth daily.     Marland Kitchen nystatin (MYCOSTATIN) 100000 UNIT/ML suspension Take 5 mLs (500,000 Units total) by mouth 4 (four) times daily. X 7 days  180 mL 0  . propranolol (INDERAL) 40 MG tablet Take 0.5 tablets (20 mg total) by mouth 2 (two) times daily. 60 tablet 1   No current facility-administered medications on file prior to visit.     Past Surgical History  Procedure Laterality Date  . Basal cell carcinoma excision      X 3  . Hernia repair  1972    Dr  Marylene Buerger  . Colonoscopy  2003    [facility]  . Cataract surgery       bilateral  . Inguinal hernia repair Right 04/06/2014    Procedure: REPAIR RIGHT INGUINAL HERNIA WITH MESH;  Surgeon: Jackolyn Confer, MD;  Location: [facility];  Service: General;  Laterality: Right;  . Insertion of mesh Right 04/06/2014    Procedure: INSERTION OF MESH;  Surgeon: Jackolyn Confer, MD;  Location: [facility];  Service: General;  Laterality: Right;    Review of Systems  Constitutional: Negative for fever and chills.  Eyes:       Negative for changes in vision.  Respiratory: Positive for cough. Negative for chest tightness and shortness of breath.   Cardiovascular: Negative for chest pain, palpitations and leg swelling.  Neurological: Negative for dizziness, weakness and headaches.      Objective:    BP 142/94 mmHg  Pulse 69  Temp(Src) 97.7 F (36.5 C) (Oral)  Resp 14  Ht 5\' 7"  (1.702 m)  Wt 167 lb (75.751 kg)  BMI 26.15 kg/m2  SpO2 96% Nursing  note and vital signs reviewed.  Physical Exam  Constitutional: He is oriented to person, place, and time. He appears well-developed and well-nourished. No distress.  HENT:  Right Ear: Hearing, tympanic membrane, external ear and ear canal normal.  Left Ear: Hearing, tympanic membrane, external ear and ear canal normal.  Nose: Nose normal. Right sinus exhibits no maxillary sinus tenderness and no frontal sinus tenderness. Left sinus exhibits no maxillary sinus tenderness and no frontal sinus tenderness.  Mouth/Throat: Uvula is midline, oropharynx is clear and moist and mucous membranes are normal.  Cardiovascular: Normal rate, regular rhythm,  normal heart sounds and intact distal pulses.   Pulmonary/Chest: Effort normal and breath sounds normal.  Neurological: He is alert and oriented to person, place, and time.  Skin: Skin is warm and dry.  Psychiatric: He has a normal mood and affect. His behavior is normal. Judgment and thought content normal.       Assessment & Plan:   Problem List Items Addressed This Visit      Cardiovascular and Mediastinum   Essential hypertension    Hypertension with improved control with current regimen and below goal 150/90. Continue current dosage of losartan, hydrochlorothiazide, and propranolol. Continue to monitor blood pressure at home. Follow-up in 3 months or sooner if needed.      Relevant Medications   hydrochlorothiazide (HYDRODIURIL) 25 MG tablet     Respiratory   Acute bronchitis - Primary    Symptoms of bronchitis appear resolving with no further antibiotic therapy needed. Continue over the counter medications as needed for symptom relief and supportive care.          I am having [name] maintain his multivitamin with minerals, Vitamin D, Multiple Vitamins-Minerals (EMERGEN-C VITAMIN C PO), propranolol, losartan, Fluticasone-Salmeterol, nystatin, and hydrochlorothiazide.

## 2015-05-05 NOTE — Assessment & Plan Note (Signed)
Hypertension with improved control with current regimen and below goal 150/90. Continue current dosage of losartan, hydrochlorothiazide, and propranolol. Continue to monitor blood pressure at home. Follow-up in 3 months or sooner if needed.

## 2015-05-05 NOTE — Assessment & Plan Note (Signed)
Symptoms of bronchitis appear resolving with no further antibiotic therapy needed. Continue over the counter medications as needed for symptom relief and supportive care.

## 2015-05-05 NOTE — Patient Instructions (Signed)
Thank you for choosing [facility].  Summary/Instructions:  If your symptoms worsen or fail to improve, please contact our office for further instruction, or in case of emergency go directly to the emergency room at the closest medical facility.   Please continue to take over the counter medications as needed for symptom relief.   Mucinex-DM or Mucinex plus Robitusson or Delsym.  Continue to take your blood pressure medications as prescribed.   Follow up in 3 months or sooner if needed.

## 2015-05-05 NOTE — Progress Notes (Signed)
Pre visit review using our clinic review tool, if applicable. No additional management support is needed unless otherwise documented below in the visit note. 

## 2015-05-19 ENCOUNTER — Other Ambulatory Visit: Payer: Self-pay | Admitting: Internal Medicine

## 2015-07-05 DIAGNOSIS — D2272 Melanocytic nevi of left lower limb, including hip: Secondary | ICD-10-CM | POA: Diagnosis not present

## 2015-07-05 DIAGNOSIS — X32XXXD Exposure to sunlight, subsequent encounter: Secondary | ICD-10-CM | POA: Diagnosis not present

## 2015-07-05 DIAGNOSIS — L820 Inflamed seborrheic keratosis: Secondary | ICD-10-CM | POA: Diagnosis not present

## 2015-07-05 DIAGNOSIS — L570 Actinic keratosis: Secondary | ICD-10-CM | POA: Diagnosis not present

## 2015-07-05 DIAGNOSIS — D225 Melanocytic nevi of trunk: Secondary | ICD-10-CM | POA: Diagnosis not present

## 2015-07-05 DIAGNOSIS — Z1283 Encounter for screening for malignant neoplasm of skin: Secondary | ICD-10-CM | POA: Diagnosis not present

## 2015-07-19 ENCOUNTER — Ambulatory Visit: Payer: Medicare Other | Admitting: Internal Medicine

## 2015-07-21 ENCOUNTER — Encounter: Payer: Self-pay | Admitting: Internal Medicine

## 2015-07-21 ENCOUNTER — Ambulatory Visit (INDEPENDENT_AMBULATORY_CARE_PROVIDER_SITE_OTHER): Payer: Medicare Other | Admitting: Internal Medicine

## 2015-07-21 ENCOUNTER — Other Ambulatory Visit (INDEPENDENT_AMBULATORY_CARE_PROVIDER_SITE_OTHER): Payer: Medicare Other

## 2015-07-21 VITALS — BP 174/100 | HR 69 | Temp 97.8°F | Resp 16 | Wt 168.0 lb

## 2015-07-21 DIAGNOSIS — R7303 Prediabetes: Secondary | ICD-10-CM | POA: Insufficient documentation

## 2015-07-21 DIAGNOSIS — I10 Essential (primary) hypertension: Secondary | ICD-10-CM

## 2015-07-21 DIAGNOSIS — E785 Hyperlipidemia, unspecified: Secondary | ICD-10-CM

## 2015-07-21 LAB — COMPREHENSIVE METABOLIC PANEL
ALBUMIN: 4.4 g/dL (ref 3.5–5.2)
ALT: 22 U/L (ref 0–53)
AST: 26 U/L (ref 0–37)
Alkaline Phosphatase: 57 U/L (ref 39–117)
BUN: 22 mg/dL (ref 6–23)
CHLORIDE: 100 meq/L (ref 96–112)
CO2: 30 mEq/L (ref 19–32)
Calcium: 10.4 mg/dL (ref 8.4–10.5)
Creatinine, Ser: 1.19 mg/dL (ref 0.40–1.50)
GFR: 61.65 mL/min (ref >60.00)
Glucose, Bld: 120 mg/dL — ABNORMAL HIGH (ref 70–99)
Potassium: 5.1 mEq/L (ref 3.5–5.1)
SODIUM: 136 meq/L (ref 135–145)
Total Bilirubin: 0.8 mg/dL (ref 0.2–1.2)
Total Protein: 7.5 g/dL (ref 6.0–8.3)

## 2015-07-21 LAB — CBC WITH DIFFERENTIAL/PLATELET
Basophils Absolute: 0 10*3/uL (ref 0.0–0.1)
Basophils Relative: 0.5 % (ref 0.0–3.0)
EOS ABS: 0.9 10*3/uL — AB (ref 0.0–0.7)
Eosinophils Relative: 9.6 % — ABNORMAL HIGH (ref 0.0–5.0)
HEMATOCRIT: 43.8 % (ref 39.0–52.0)
HEMOGLOBIN: 14.9 g/dL (ref 13.0–17.0)
LYMPHS PCT: 20.8 % (ref 12.0–46.0)
Lymphs Abs: 1.9 10*3/uL (ref 0.7–4.0)
MCHC: 34 g/dL (ref 30.0–36.0)
MCV: 85.8 fl (ref 78.0–100.0)
MONOS PCT: 8.2 % (ref 3.0–12.0)
Monocytes Absolute: 0.8 10*3/uL (ref 0.1–1.0)
Neutro Abs: 5.7 10*3/uL (ref 1.4–7.7)
Neutrophils Relative %: 60.9 % (ref 43.0–77.0)
Platelets: 309 10*3/uL (ref 150.0–400.0)
RBC: 5.1 Mil/uL (ref 4.22–5.81)
RDW: 14.5 % (ref 11.5–15.5)
WBC: 9.3 10*3/uL (ref 4.0–10.5)

## 2015-07-21 LAB — LIPID PANEL
CHOLESTEROL: 190 mg/dL (ref 0–200)
HDL: 36.7 mg/dL — ABNORMAL LOW (ref >39.00)
LDL Cholesterol: 125 mg/dL — ABNORMAL HIGH (ref 0–99)
NONHDL: 153.55
Total CHOL/HDL Ratio: 5
Triglycerides: 145 mg/dL (ref 0.0–149.0)
VLDL: 29 mg/dL (ref 0.0–40.0)

## 2015-07-21 LAB — TSH: TSH: 0.82 u[IU]/mL (ref 0.35–4.50)

## 2015-07-21 LAB — HEMOGLOBIN A1C: HEMOGLOBIN A1C: 5.8 % (ref 4.6–6.5)

## 2015-07-21 MED ORDER — FLUTICASONE-SALMETEROL 100-50 MCG/DOSE IN AEPB: 1.0000 | INHALATION_SPRAY | Freq: Two times a day (BID) | RESPIRATORY_TRACT | Status: DC

## 2015-07-21 MED ORDER — HYDROCHLOROTHIAZIDE 25 MG PO TABS: 25.0000 mg | ORAL_TABLET | ORAL | Status: DC

## 2015-07-21 NOTE — Patient Instructions (Addendum)

## 2015-07-21 NOTE — Progress Notes (Signed)
Pre visit review using our clinic review tool, if applicable. No additional management support is needed unless otherwise documented below in the visit note. 

## 2015-07-21 NOTE — Assessment & Plan Note (Signed)
Exercising regularly - walking Eats pretty healthy Check lipids

## 2015-07-21 NOTE — Progress Notes (Signed)
Subjective:    Patient ID: Christopher Richards, male    DOB: 1927-04-08, 82 y.o.   MRN: [id number]  HPI He is here to establish with a new pcp.   He is here for follow up.  Hypertension: He is taking his medication daily. He is compliant with a low sodium diet.  He denies chest pain, palpitations, edema, shortness of breath and regular headaches. He is exercising regularly.  He does monitor his blood pressure at home - 126/75, 121/76, 133/82, 132/78.    Prediabetes:  He is not always compliant with a low sugar/carbohydrate diet.  He is exercising regularly.  Hyperlipemia:  He was on niaspan in the past.  He is exercising and eats a health diet.    He still has a little residual cough from his cold a couple of months ago.  It is intermittent and related to post nasal drip.  He denies any sob, wheeze and fever.    Medications and allergies reviewed with patient and updated if appropriate.  Patient Active Problem List   Diagnosis Date Noted  . Prediabetes 07/21/2015  . Acute bronchitis 05/05/2015  . Medicare annual wellness visit, subsequent 03/09/2015  . Inguinal hernia 03/23/2014  . Essential hypertension 04/01/2008  . AORTIC INSUFFICIENCY, MILD 04/01/2008  . Hyperglycemia 04/01/2008  . SKIN CANCER, HX OF 04/01/2008  . Hyperlipidemia 06/26/2006  . BENIGN PROSTATIC HYPERTROPHY 06/26/2006    Current Outpatient Prescriptions on File Prior to Visit  Medication Sig Dispense Refill  . Cholecalciferol (VITAMIN D) 2000 UNITS tablet Take 2,000 Units by mouth daily.    . Fluticasone-Salmeterol (ADVAIR) 100-50 MCG/DOSE AEPB Inhale 1 puff into the lungs 2 (two) times daily. 60 each 2  . hydrochlorothiazide (HYDRODIURIL) 25 MG tablet Take 1 tablet (25 mg total) by mouth every morning. 90 tablet 0  . losartan (COZAAR) 100 MG tablet TAKE 1 TABLET DAILY 90 tablet 2  . Multiple Vitamin (MULTIVITAMIN WITH MINERALS) TABS tablet Take 1 tablet by mouth daily.    . Multiple Vitamins-Minerals (EMERGEN-C  VITAMIN C PO) Take 1 tablet by mouth daily.     . propranolol (INDERAL) 40 MG tablet Take 0.5 tablets (20 mg total) by mouth 2 (two) times daily. 60 tablet 1   No current facility-administered medications on file prior to visit.    Past Medical History  Diagnosis Date  . Chest pain 1992    NEGATIVE CATH  . Hyperglycemia     PMH of  . Hyperlipidemia   . Benign prostatic hypertrophy   . Basal cell cancer     SKIN.Marland KitchenFACE AND HAND  . Pneumonia 2009    treated as OP  . Hypertension   . Bronchitis     11/2013   . Arthritis   . Tachycardia     hx of per patient     Past Surgical History  Procedure Laterality Date  . Basal cell carcinoma excision      X 3  . Hernia repair  1972    Dr  Marylene Buerger  . Colonoscopy  2003    [facility]  . Cataract surgery       bilateral  . Inguinal hernia repair Right 04/06/2014    Procedure: REPAIR RIGHT INGUINAL HERNIA WITH MESH;  Surgeon: Jackolyn Confer, MD;  Location: [facility];  Service: General;  Laterality: Right;  . Insertion of mesh Right 04/06/2014    Procedure: INSERTION OF MESH;  Surgeon: Jackolyn Confer, MD;  Location: [facility];  Service: General;  Laterality: Right;    Social History   Social History  . Marital Status: Married    Spouse Name: N/A  . Number of Children: 2  . Years of Education: 18   Occupational History  . [employer]    Social History Main Topics  . Smoking status: Never Smoker   . Smokeless tobacco: Never Used  . Alcohol Use: No  . Drug Use: No  . Sexual Activity: Not on file   Other Topics Concern  . Not on file   Social History Narrative   Denies abuse and feels safe at home.    Family History  Problem Relation Age of Onset  . Hypertension Mother   . Heart disease Mother     AF  . Transient ischemic attack Mother   . Heart attack Father 26  . Hypertension Father   . Lung disease Father     ? [facility] visitor)  . Alcohol abuse Brother   . Cancer Neg Hx   . Diabetes Neg Hx      Review of Systems  Constitutional: Negative for fever.  Respiratory: Positive for cough (some residual from cold, pnd - intermittent). Negative for shortness of breath and wheezing.   Cardiovascular: Negative for chest pain, palpitations and leg swelling.  Gastrointestinal: Negative for abdominal pain.  Neurological: Negative for dizziness, light-headedness and headaches.       Objective:   Filed Vitals:   07/21/15 0926  BP: 174/100  Pulse: 69  Temp: 97.8 F (36.6 C)  Resp: 16   Filed Weights   07/21/15 0926  Weight: 168 lb (76.204 kg)   Body mass index is 26.31 kg/(m^2).   Physical Exam Constitutional: Appears well-developed and well-nourished. No distress.  Neck: Neck supple. No tracheal deviation present. No thyromegaly present.  No carotid bruit. No cervical adenopathy.   Cardiovascular: Normal rate, regular rhythm and normal heart sounds.   2/6 systolic murmur heard.  No edema Pulmonary/Chest: Effort normal and breath sounds normal. No respiratory distress. No wheezes.       Assessment & Plan:   See Problem List for Assessment and Plan of chronic medical problems.  F/u in 6 months

## 2015-07-21 NOTE — Assessment & Plan Note (Signed)
BP very well controlled at home (his BP cuff has been deemed accurate) Continue to monitor at home Continue current medications at current doses Check basic labs

## 2015-07-21 NOTE — Assessment & Plan Note (Signed)
Check a1c Continue regular exercise Low sugar/carb diet 

## 2015-10-22 ENCOUNTER — Other Ambulatory Visit: Payer: Self-pay | Admitting: Family

## 2016-01-19 NOTE — Progress Notes (Signed)
Subjective:    Patient ID: Christopher Richards, male    DOB: [birth year], 82 y.o.   MRN: [id number]  HPI The patient is here for follow up.  Cold symptoms:  He has had cold symptoms for over one month.  He has been taking coricidin and it has kept it controlled.  His BP has been elevated since this started 140/90's.  He is coughing very little, but 2-3 times a day he is coughing phlegm up that is gray in color.  His nasal mucus is clear-yellow.  He is using the advair.  He denies wheeze, but has SOB. He has no energy.  No fevers.    Hypertension: He is taking his medication daily. He does have increased urination in the evening and at night and wonders if this is related to the hydrochlorothiazide. He is compliant with a low sodium diet.  He denies chest pain, palpitations, edema and regular headaches. He is not exercising regularly due to his cold.  He does monitor his blood pressure at home - it was well controlled prior to his cold.    Prediabetes:  He is compliant with a low sugar/carbohydrate diet.  He is not exercising regularly due to cold symptoms.  Hyperlipidemia:  He is controlling his cholesterol with lifestyle.  He eats fairly healthy and is not exercising right now, but typically exercises regularly.    Medications and allergies reviewed with patient and updated if appropriate.  Patient Active Problem List   Diagnosis Date Noted  . Prediabetes 07/21/2015  . Medicare annual wellness visit, subsequent 03/09/2015  . Inguinal hernia 03/23/2014  . Essential hypertension 04/01/2008  . AORTIC INSUFFICIENCY, MILD 04/01/2008  . SKIN CANCER, HX OF 04/01/2008  . Hyperlipidemia 06/26/2006  . BENIGN PROSTATIC HYPERTROPHY 06/26/2006    Current Outpatient Prescriptions on File Prior to Visit  Medication Sig Dispense Refill  . Cholecalciferol (VITAMIN D) 2000 UNITS tablet Take 2,000 Units by mouth daily.    . Fluticasone-Salmeterol (ADVAIR) 100-50 MCG/DOSE AEPB Inhale 1 puff into the lungs 2  (two) times daily. 60 each 5  . hydrochlorothiazide (HYDRODIURIL) 25 MG tablet Take 1 tablet (25 mg total) by mouth every morning. 90 tablet 3  . losartan (COZAAR) 100 MG tablet TAKE 1 TABLET DAILY 90 tablet 2  . Multiple Vitamin (MULTIVITAMIN WITH MINERALS) TABS tablet Take 1 tablet by mouth daily.    . propranolol (INDERAL) 40 MG tablet Take 0.5 tablets (20 mg total) by mouth 2 (two) times daily. 60 tablet 1   No current facility-administered medications on file prior to visit.     Past Medical History:  Diagnosis Date  . Arthritis   . Basal cell cancer    SKIN.Marland KitchenFACE AND HAND  . Benign prostatic hypertrophy   . Bronchitis    11/2013   . Chest pain 1992   NEGATIVE CATH  . Hyperglycemia    PMH of  . Hyperlipidemia   . Hypertension   . Pneumonia 2009   treated as OP  . Tachycardia    hx of per patient     Past Surgical History:  Procedure Laterality Date  . BASAL CELL CARCINOMA EXCISION     X 3  . cataract surgery      bilateral  . COLONOSCOPY  2003   [facility]  . HERNIA REPAIR  1972   Dr  Marylene Buerger  . INGUINAL HERNIA REPAIR Right 04/06/2014   Procedure: REPAIR RIGHT INGUINAL HERNIA WITH MESH;  Surgeon: Jackolyn Confer, MD;  Location: [facility];  Service: General;  Laterality: Right;  . INSERTION OF MESH Right 04/06/2014   Procedure: INSERTION OF MESH;  Surgeon: Jackolyn Confer, MD;  Location: [facility];  Service: General;  Laterality: Right;    Social History   Social History  . Marital status: Married    Spouse name: N/A  . Number of children: 2  . Years of education: 45   Occupational History  . [employer]    Social History Main Topics  . Smoking status: Never Smoker  . Smokeless tobacco: Never Used  . Alcohol use No  . Drug use: No  . Sexual activity: Not on file   Other Topics Concern  . Not on file   Social History Narrative   Denies abuse and feels safe at home.    Family History  Problem Relation Age of Onset  . Hypertension Mother   . Heart  disease Mother     AF  . Transient ischemic attack Mother   . Heart attack Father 46  . Hypertension Father   . Lung disease Father     ? [facility] visitor)  . Alcohol abuse Brother   . Cancer Neg Hx   . Diabetes Neg Hx     Review of Systems  Constitutional: Negative for fever.  HENT: Positive for congestion and sinus pain (seldom). Negative for ear pain and sore throat.   Respiratory: Positive for cough and shortness of breath. Negative for wheezing.   Cardiovascular: Negative for chest pain, palpitations and leg swelling.  Neurological: Negative for dizziness, light-headedness and headaches.       Objective:   Vitals:   01/20/16 0934  BP: (!) 148/92  Pulse: 81  Resp: 16  Temp: 98 F (36.7 C)   Filed Weights   01/20/16 0934  Weight: 173 lb (78.5 kg)   Body mass index is 27.1 kg/m.   Physical Exam    GENERAL APPEARANCE: Appears stated age, well appearing, NAD EYES: conjunctiva clear, no icterus HEENT: bilateral tympanic membranes and ear canals normal, oropharynx with no erythema, no thyromegaly, trachea midline, no cervical or supraclavicular lymphadenopathy LUNGS: Clear to auscultation without wheeze or crackles, unlabored breathing, good air entry bilaterally HEART: Normal [id number] with 1/6 systolic and 1/6 diastolic murmurs EXTREMITIES: Without clubbing, cyanosis, or edema    Assessment & Plan:    See Problem List for Assessment and Plan of chronic medical problems.   F/u in 6 months

## 2016-01-20 ENCOUNTER — Ambulatory Visit (INDEPENDENT_AMBULATORY_CARE_PROVIDER_SITE_OTHER): Payer: Medicare Other | Admitting: Internal Medicine

## 2016-01-20 ENCOUNTER — Other Ambulatory Visit (INDEPENDENT_AMBULATORY_CARE_PROVIDER_SITE_OTHER): Payer: Medicare Other

## 2016-01-20 ENCOUNTER — Encounter: Payer: Self-pay | Admitting: Internal Medicine

## 2016-01-20 VITALS — BP 148/92 | HR 81 | Temp 98.0°F | Resp 16 | Wt 173.0 lb

## 2016-01-20 DIAGNOSIS — J329 Chronic sinusitis, unspecified: Secondary | ICD-10-CM

## 2016-01-20 DIAGNOSIS — I351 Nonrheumatic aortic (valve) insufficiency: Secondary | ICD-10-CM

## 2016-01-20 DIAGNOSIS — R7303 Prediabetes: Secondary | ICD-10-CM

## 2016-01-20 DIAGNOSIS — I10 Essential (primary) hypertension: Secondary | ICD-10-CM

## 2016-01-20 DIAGNOSIS — E7800 Pure hypercholesterolemia, unspecified: Secondary | ICD-10-CM

## 2016-01-20 LAB — LIPID PANEL
CHOL/HDL RATIO: 5
CHOLESTEROL: 192 mg/dL (ref 0–200)
HDL: 41.5 mg/dL (ref >39.00)
LDL CALC: 122 mg/dL — AB (ref 0–99)
NonHDL: 150.22
TRIGLYCERIDES: 142 mg/dL (ref 0.0–149.0)
VLDL: 28.4 mg/dL (ref 0.0–40.0)

## 2016-01-20 LAB — COMPREHENSIVE METABOLIC PANEL
ALT: 25 U/L (ref 0–53)
AST: 25 U/L (ref 0–37)
Albumin: 4.4 g/dL (ref 3.5–5.2)
Alkaline Phosphatase: 62 U/L (ref 39–117)
BUN: 18 mg/dL (ref 6–23)
CALCIUM: 10.3 mg/dL (ref 8.4–10.5)
CHLORIDE: 102 meq/L (ref 96–112)
CO2: 29 meq/L (ref 19–32)
CREATININE: 1.24 mg/dL (ref 0.40–1.50)
GFR: 58.72 mL/min — AB (ref >60.00)
GLUCOSE: 126 mg/dL — AB (ref 70–99)
Potassium: 5.2 mEq/L — ABNORMAL HIGH (ref 3.5–5.1)
Sodium: 139 mEq/L (ref 135–145)
Total Bilirubin: 0.8 mg/dL (ref 0.2–1.2)
Total Protein: 7.7 g/dL (ref 6.0–8.3)

## 2016-01-20 LAB — HEMOGLOBIN A1C: Hgb A1c MFr Bld: 5.8 % (ref 4.6–6.5)

## 2016-01-20 MED ORDER — HYDROCHLOROTHIAZIDE 25 MG PO TABS: 12.5000 mg | ORAL_TABLET | ORAL | 3 refills | Status: DC

## 2016-01-20 MED ORDER — AMOXICILLIN-POT CLAVULANATE 875-125 MG PO TABS: 1.0000 | ORAL_TABLET | Freq: Two times a day (BID) | ORAL | 0 refills | Status: DC

## 2016-01-20 NOTE — Assessment & Plan Note (Signed)
Check lipids Controlling lipids with lifestyle

## 2016-01-20 NOTE — Assessment & Plan Note (Addendum)
Blood pressure slightly elevated here today and has been elevated for one month with his cold symptoms--blood pressure well-controlled prior to cold symptoms. EKG today He is experiencing increased evening and nighttime urination and feels that may be related to the hydrochlorothiazide Will decrease hydrochlorothiazide to 12.5 mg daily Continue propranolol and losartan at current doses cmp

## 2016-01-20 NOTE — Progress Notes (Signed)
Pre visit review using our clinic review tool, if applicable. No additional management support is needed unless otherwise documented below in the visit note. 

## 2016-01-20 NOTE — Assessment & Plan Note (Signed)
Symptoms for > 1 month Will treat with antibiotics given length of illness - augmentin bid  Call if no improvement

## 2016-01-20 NOTE — Patient Instructions (Addendum)
  Test(s) ordered today. Your results will be released to [facility] (or called to you) after review, usually within 72hours after test completion. If any changes need to be made, you will be notified at that same time.   Medications reviewed and updated.  Changes include decreasing hydrochlorothiazide to 12.5mg  daily.  Monitor your BP at home and let me know if your BP is elevated so we can further adjust your medication.   An antibiotic was also sent to your pharmacy for your infection.   Your prescription(s) have been submitted to your pharmacy. Please take as directed and contact our office if you believe you are having problem(s) with the medication(s).  An Echo was ordered to assess your heart.   An EKG was done today  Please followup in 6 months

## 2016-01-20 NOTE — Assessment & Plan Note (Signed)
Check Echo - no recent echo Asymptomatic, murmur mild on exam

## 2016-01-20 NOTE — Assessment & Plan Note (Signed)
Check a1c 

## 2016-01-21 ENCOUNTER — Encounter: Payer: Self-pay | Admitting: Internal Medicine

## 2016-02-13 ENCOUNTER — Other Ambulatory Visit: Payer: Self-pay | Admitting: Family

## 2016-03-12 ENCOUNTER — Other Ambulatory Visit: Payer: Self-pay

## 2016-03-12 ENCOUNTER — Ambulatory Visit (HOSPITAL_COMMUNITY): Payer: Medicare Other | Attending: Cardiovascular Disease

## 2016-03-12 DIAGNOSIS — I10 Essential (primary) hypertension: Secondary | ICD-10-CM | POA: Insufficient documentation

## 2016-03-12 DIAGNOSIS — E785 Hyperlipidemia, unspecified: Secondary | ICD-10-CM | POA: Diagnosis not present

## 2016-03-12 DIAGNOSIS — I351 Nonrheumatic aortic (valve) insufficiency: Secondary | ICD-10-CM | POA: Diagnosis not present

## 2016-05-21 NOTE — Progress Notes (Addendum)
Subjective:   Christopher Richards is a 83 y.o. male who presents for Medicare Annual/Subsequent preventive examination.  Review of Systems:  No ROS.  Medicare Wellness Visit.  Cardiac Risk Factors include: advanced age (>31men, >83 women);dyslipidemia;hypertension;male gender Sleep patterns: has interrupted sleep, has daytime sleepiness and takes , gets up 2-3 times nightly to void and sleeps 6-7 hours nightly.  Discussed recommended sleep tips  Home Safety/Smoke Alarms: Feels safe in home. Smoke alarms in place.   Living environment; residence and Firearm Safety: [place], firearms stored safely Lives with wife, has close family support, no needs for DME  Seat Belt Safety/Bike Helmet: Wears seat belt.  Counseling:   Eye Exam- appointment yearly Dental- dentures  Male:   CCS- N/A due to age    PSA-  Lab Results  Component Value Date   PSA 2.11 08/27/2006      Objective:    Vitals: BP (!) 156/90   Pulse 99   Resp 20   Ht 5\' 7"  (1.702 m)   Wt 171 lb (77.6 kg)   SpO2 99%   BMI 26.78 kg/m   Body mass index is 26.78 kg/m.  Tobacco History  Smoking Status  . Never Smoker  Smokeless Tobacco  . Never Used     Counseling given: Not Answered   Past Medical History:  Diagnosis Date  . Arthritis   . Basal cell cancer    SKIN.Marland KitchenFACE AND HAND  . Benign prostatic hypertrophy   . Bronchitis    11/2013   . Chest pain 1992   NEGATIVE CATH  . Hyperglycemia    PMH of  . Hyperlipidemia   . Hypertension   . Pneumonia 2009   treated as OP  . Tachycardia    hx of per patient    Past Surgical History:  Procedure Laterality Date  . BASAL CELL CARCINOMA EXCISION     X 3  . cataract surgery      bilateral  . COLONOSCOPY  2003   [facility]  . HERNIA REPAIR  1972   Dr  Marylene Buerger  . INGUINAL HERNIA REPAIR Right 04/06/2014   Procedure: REPAIR RIGHT INGUINAL HERNIA WITH MESH;  Surgeon: Jackolyn Confer, MD;  Location: [facility];  Service: General;  Laterality: Right;  .  INSERTION OF MESH Right 04/06/2014   Procedure: INSERTION OF MESH;  Surgeon: Jackolyn Confer, MD;  Location: [facility];  Service: General;  Laterality: Right;   Family History  Problem Relation Age of Onset  . Alcohol abuse Brother   . Hypertension Mother   . Heart disease Mother     AF  . Transient ischemic attack Mother   . Heart attack Father 5  . Hypertension Father   . Lung disease Father     ? [facility] visitor)  . Cancer Neg Hx   . Diabetes Neg Hx    History  Sexual Activity  . Sexual activity: Not Currently    Outpatient Encounter Prescriptions as of 05/22/2016  Medication Sig  . Cholecalciferol (VITAMIN D) 2000 UNITS tablet Take 2,000 Units by mouth daily.  . Fluticasone-Salmeterol (ADVAIR) 100-50 MCG/DOSE AEPB Inhale 1 puff into the lungs 2 (two) times daily.  . hydrochlorothiazide (HYDRODIURIL) 25 MG tablet Take 0.5 tablets (12.5 mg total) by mouth every morning.  Marland Kitchen losartan (COZAAR) 100 MG tablet TAKE 1 TABLET DAILY  . Multiple Vitamin (MULTIVITAMIN WITH MINERALS) TABS tablet Take 1 tablet by mouth daily.  . propranolol (INDERAL) 40 MG tablet Take  0.5 tablets (20 mg total) by mouth 2 (two) times daily.  . [DISCONTINUED] amoxicillin-clavulanate (AUGMENTIN) 875-125 MG tablet Take 1 tablet by mouth 2 (two) times daily. (Patient not taking: Reported on 05/22/2016)   No facility-administered encounter medications on file as of 05/22/2016.     Activities of Daily Living In your present state of health, do you have any difficulty performing the following activities: 05/22/2016  Hearing? N  Vision? N  Difficulty concentrating or making decisions? Y  Walking or climbing stairs? N  Dressing or bathing? N  Doing errands, shopping? N  Preparing Food and eating ? N  Using the Toilet? N  In the past six months, have you accidently leaked urine? N  Do you have problems with loss of bowel control? N  Managing your Medications? N  Managing your Finances? N  Housekeeping or  managing your Housekeeping? N  Some recent data might be hidden    Patient Care Team: Binnie Rail, MD as PCP - General (Internal Medicine)   Assessment:    Physical assessment deferred to PCP.  Exercise Activities and Dietary recommendations Current Exercise Habits: Home exercise routine, Type of exercise: walking, Time (Minutes): 30, Frequency (Times/Week): 2, Weekly Exercise (Minutes/Week): 60, Exercise limited by: None identified  Diet (meal preparation, eat out, water intake, caffeinated beverages, dairy products, fruits and vegetables): Patient reports generally unhealthy. He eats out a lot and has been eating fried foods and does not think about salt intake when he is ordering his meal. He eats a variety of vegetables but eats a limited amount of fruit, he drinks 2 bottles of water per day.   Discussed low sodium, low fat/cholesterol, and low sugar diet. Educated regarding better choices when eating out, encouraged patient to increase water intake. Patient states he does not need education print-outs regarding diet, his wife is a dietician and comments he knows what to do he needs to start to make better food choices.   Goals    . I got to get back to exercising          Begin to walk every day. Cut back on salt and fry foods, start to eat more fruit, nuts and vegetables.       Fall Risk Fall Risk  05/22/2016 07/21/2015 03/23/2014  Falls in the past year? No No No   Depression Screen PHQ 2/9 Scores 05/22/2016 07/21/2015 03/23/2014  PHQ - 2 Score 1 0 0  Exception Documentation - - Patient refusal    Cognitive Function MMSE - Mini Mental State Exam 05/22/2016  Orientation to time 5  Orientation to Place 5  Registration 3  Attention/ Calculation 5  Recall 2  Language- name 2 objects 2  Language- repeat 1  Language- follow 3 step command 3  Language- read & follow direction 1  Write a sentence 1  Copy design 1  Total score 29        Immunization History  Administered  Date(s) Administered  . Influenza-Unspecified 11/24/2013, 11/17/2015  . Pneumococcal Conjugate-13 06/23/2014  . Pneumococcal Polysaccharide-23 03/09/2015  . Tdap 11/21/2011   Screening Tests Health Maintenance  Topic Date Due  . INFLUENZA VACCINE  09/19/2016  . TETANUS/TDAP  11/20/2021  . PNA vac Low Risk Adult  Completed      Plan:     An acute visit scheduled today with PCP. Patient is concerned about his blood pressure being elevated over the last week and he has been congested.  Start to eat heart healthy diet (  full of fruits, vegetables, whole grains, lean protein, water--limit salt, fat, and sugar intake) and increase physical activity as tolerated.  Bring a copy of your advance directives to your next office visit.  Continue doing brain stimulating activities (puzzles, reading, adult coloring books, staying active) to keep memory sharp.   Encouraged [employer] program  During the course of the visit the patient was educated and counseled about the following appropriate screening and preventive services:   Vaccines to include Pneumoccal, Influenza, Hepatitis B, Td, Zostavax, HCV  Cardiovascular Disease  Colorectal cancer screening  Diabetes screening  Prostate Cancer Screening  Glaucoma screening  Nutrition counseling   Patient Instructions (the written plan) was given to the patient.    Michiel Cowboy, RN  05/22/2016    Medical screening examination/treatment/procedure(s) were performed by non-physician practitioner and as supervising physician I was immediately available for consultation/collaboration. I agree with above. Binnie Rail, MD

## 2016-05-21 NOTE — Progress Notes (Signed)
Pre visit review using our clinic review tool, if applicable. No additional management support is needed unless otherwise documented below in the visit note. 

## 2016-05-22 ENCOUNTER — Ambulatory Visit (INDEPENDENT_AMBULATORY_CARE_PROVIDER_SITE_OTHER): Payer: Medicare Other | Admitting: Internal Medicine

## 2016-05-22 ENCOUNTER — Ambulatory Visit (INDEPENDENT_AMBULATORY_CARE_PROVIDER_SITE_OTHER): Payer: Medicare Other | Admitting: *Deleted

## 2016-05-22 ENCOUNTER — Encounter: Payer: Self-pay | Admitting: Internal Medicine

## 2016-05-22 VITALS — BP 190/94 | HR 72 | Temp 97.7°F | Resp 16 | Wt 172.0 lb

## 2016-05-22 VITALS — BP 156/90 | HR 99 | Resp 20 | Ht 67.0 in | Wt 171.0 lb

## 2016-05-22 DIAGNOSIS — R0602 Shortness of breath: Secondary | ICD-10-CM | POA: Insufficient documentation

## 2016-05-22 DIAGNOSIS — Z0000 Encounter for general adult medical examination without abnormal findings: Secondary | ICD-10-CM

## 2016-05-22 DIAGNOSIS — R5383 Other fatigue: Secondary | ICD-10-CM | POA: Diagnosis not present

## 2016-05-22 DIAGNOSIS — J0100 Acute maxillary sinusitis, unspecified: Secondary | ICD-10-CM

## 2016-05-22 DIAGNOSIS — I10 Essential (primary) hypertension: Secondary | ICD-10-CM | POA: Diagnosis not present

## 2016-05-22 MED ORDER — AMOXICILLIN-POT CLAVULANATE 875-125 MG PO TABS: 1.0000 | ORAL_TABLET | Freq: Two times a day (BID) | ORAL | 0 refills | Status: DC

## 2016-05-22 MED ORDER — HYDROCHLOROTHIAZIDE 12.5 MG PO TABS: 12.5000 mg | ORAL_TABLET | Freq: Every day | ORAL | 3 refills | Status: DC

## 2016-05-22 MED ORDER — FLUTICASONE-SALMETEROL 100-50 MCG/DOSE IN AEPB: 1.0000 | INHALATION_SPRAY | Freq: Two times a day (BID) | RESPIRATORY_TRACT | 5 refills | Status: DC

## 2016-05-22 NOTE — Patient Instructions (Addendum)
Start to eat heart healthy diet (full of fruits, vegetables, whole grains, lean protein, water--limit salt, fat, and sugar intake) and increase physical activity as tolerated.  Bring a copy of your advance directives to your next office visit.  Continue doing brain stimulating activities (puzzles, reading, adult coloring books, staying active) to keep memory sharp.   Call [employer] to enroll in Christopher Richards , Thank you for taking time to come for your Medicare Wellness Visit. I appreciate your ongoing commitment to your health goals. Please review the following plan we discussed and let me know if I can assist you in the future.   These are the goals we discussed: Goals    . I got to get back to exercising          Begin to walk every day. Cut back on salt and fry foods, start to eat more fruit, nuts and vegetables.        This is a list of the screening recommended for you and due dates:  Health Maintenance  Topic Date Due  . Flu Shot  09/19/2016  . Tetanus Vaccine  11/20/2021  . Pneumonia vaccines  Completed

## 2016-05-22 NOTE — Progress Notes (Signed)
Subjective:    Patient ID: Christopher Richards, male    DOB: 07/17/1927, 83 y.o.   MRN: [id number]  HPI He is here for an acute visit.    He was at the beach from the [place]th - 26th.  He had congestion and cough.  He went to urgent care and was put on zyrtec. He has been using flonase and coricidan BP cold medication.  His BP was in the 140/80's  It increased after using both the flonase and coricidan.  It was not as high as it was today here, but has been higher than usual.  At home  His BP at 2pm was 151/91.  He took the flonase this afternoon.  He thinks the flonase may be causing his BP to go up.   He still has a lot of nasal congestion, PND, sore throat, sinus pressure and cough.  He feels like this is just not clearing up and he wonders if he needs an antibiotic.      He states a low energy level x 30 days.  He thinks that is related to having to do more around the house because his wife is not able to do as much now.  He had SOB at the beach related to wind.  It is better since being home. He denies any recent SOB>     Medications and allergies reviewed with patient and updated if appropriate.  Patient Active Problem List   Diagnosis Date Noted  . Chronic sinusitis 01/20/2016  . Prediabetes 07/21/2015  . Inguinal hernia 03/23/2014  . Essential hypertension 04/01/2008  . Aortic insufficiency 04/01/2008  . SKIN CANCER, HX OF 04/01/2008  . Hyperlipidemia 06/26/2006  . BENIGN PROSTATIC HYPERTROPHY 06/26/2006    Current Outpatient Prescriptions on File Prior to Visit  Medication Sig Dispense Refill  . Cholecalciferol (VITAMIN D) 2000 UNITS tablet Take 2,000 Units by mouth daily.    . Fluticasone-Salmeterol (ADVAIR) 100-50 MCG/DOSE AEPB Inhale 1 puff into the lungs 2 (two) times daily. 60 each 5  . hydrochlorothiazide (HYDRODIURIL) 25 MG tablet Take 0.5 tablets (12.5 mg total) by mouth every morning. 45 tablet 3  . losartan (COZAAR) 100 MG tablet TAKE 1 TABLET DAILY 90 tablet 3  .  Multiple Vitamin (MULTIVITAMIN WITH MINERALS) TABS tablet Take 1 tablet by mouth daily.    . propranolol (INDERAL) 40 MG tablet Take 0.5 tablets (20 mg total) by mouth 2 (two) times daily. 60 tablet 1   No current facility-administered medications on file prior to visit.     Past Medical History:  Diagnosis Date  . Arthritis   . Basal cell cancer    SKIN.Marland KitchenFACE AND HAND  . Benign prostatic hypertrophy   . Bronchitis    11/2013   . Chest pain 1992   NEGATIVE CATH  . Hyperglycemia    PMH of  . Hyperlipidemia   . Hypertension   . Pneumonia 2009   treated as OP  . Tachycardia    hx of per patient     Past Surgical History:  Procedure Laterality Date  . BASAL CELL CARCINOMA EXCISION     X 3  . cataract surgery      bilateral  . COLONOSCOPY  2003   [facility]  . HERNIA REPAIR  1972   Dr  Marylene Buerger  . INGUINAL HERNIA REPAIR Right 04/06/2014   Procedure: REPAIR RIGHT INGUINAL HERNIA WITH MESH;  Surgeon: Jackolyn Confer, MD;  Location: [facility];  Service: General;  Laterality: Right;  . INSERTION OF MESH Right 04/06/2014   Procedure: INSERTION OF MESH;  Surgeon: Jackolyn Confer, MD;  Location: [facility];  Service: General;  Laterality: Right;    Social History   Social History  . Marital status: Married    Spouse name: N/A  . Number of children: 2  . Years of education: 74   Occupational History  . [employer]    Social History Main Topics  . Smoking status: Never Smoker  . Smokeless tobacco: Never Used  . Alcohol use No  . Drug use: No  . Sexual activity: Not Currently   Other Topics Concern  . Not on file   Social History Narrative   Denies abuse and feels safe at home.    Family History  Problem Relation Age of Onset  . Alcohol abuse Brother   . Hypertension Mother   . Heart disease Mother     AF  . Transient ischemic attack Mother   . Heart attack Father 23  . Hypertension Father   . Lung disease Father     ? [facility] visitor)  . Cancer Neg Hx    . Diabetes Neg Hx     Review of Systems  Constitutional: Negative for fever.  HENT: Positive for congestion.   Respiratory: Negative for cough, shortness of breath (none now since returning to home) and wheezing.   Cardiovascular: Negative for chest pain, palpitations and leg swelling.  Neurological: Negative for dizziness, light-headedness and headaches.       Objective:   Vitals:   05/22/16 1553 05/22/16 1557  BP: (!) 204/96 (!) 190/94  Pulse: 72   Resp: 16   Temp: 97.7 F (36.5 C)    Filed Weights   05/22/16 1553  Weight: 172 lb (78 kg)   Body mass index is 26.94 kg/m.  Wt Readings from Last 3 Encounters:  05/22/16 172 lb (78 kg)  05/22/16 171 lb (77.6 kg)  01/20/16 173 lb (78.5 kg)     Physical Exam Constitutional: Appears well-developed and well-nourished. No distress.  HENT:  Head: Normocephalic and atraumatic.  Neck: Neck supple. No tracheal deviation present. No thyromegaly present.  No cervical lymphadenopathy Cardiovascular: Normal rate, regular rhythm and normal heart sounds.   2/6 systolic murmur heard. No carotid bruit .  No edema Pulmonary/Chest: Effort normal and breath sounds normal. No respiratory distress. No has no wheezes. No rales.  Skin: Skin is warm and dry. Not diaphoretic.  Psychiatric: Normal mood and affect. Behavior is normal.         Assessment & Plan:   See Problem List for Assessment and Plan of chronic medical problems.

## 2016-05-22 NOTE — Assessment & Plan Note (Signed)
Not controlled, which is new - ? Related to cold medications Increase hctz to 25 mg daily  Increase propranolol to 20 mg TID Monitor BP - if no improving he will call Follow up in one week, sooner if needed

## 2016-05-22 NOTE — Progress Notes (Signed)
Pre visit review using our clinic review tool, if applicable. No additional management support is needed unless otherwise documented below in the visit note. 

## 2016-05-22 NOTE — Assessment & Plan Note (Signed)
Given duration will start an antibiotic Will stop using flonase Continue coricidan for now

## 2016-05-22 NOTE — Assessment & Plan Note (Signed)
Likely related to increase activities/ responsibilities at home Follow up next week

## 2016-05-22 NOTE — Patient Instructions (Addendum)
Increase the hydrochlorothiazide to 25 mg daily temporarily.  Increase the propranolol to 20 mg three times a day.  Once your BP returns to normal < 140/90 you can decrease the medication back to what you had been taking.   If your BP is not improving, call so we can adjust your medication further.   Follow up in one week   An antibiotic was sent to the pharmacy.

## 2016-05-22 NOTE — Assessment & Plan Note (Signed)
Resolved since leaving the beach -  Weather was not good Continue advair Will follow up next week - sooner if SOB returns

## 2016-05-30 NOTE — Progress Notes (Signed)
Subjective:    Patient ID: Christopher Richards, male    DOB: [birth year], 83 y.o.   MRN: [id number]  HPI The patient is here for follow up of hypertension.   Elevated blood pressure:  He increased his hctz to 25 mg dialy and increased propranolol to 20 mg TID and he is here for follow up.   Sinusitis:  We started him on an antibiotic one week ago and I advised continuing coricidin products.     Medications and allergies reviewed with patient and updated if appropriate.  Patient Active Problem List   Diagnosis Date Noted  . SOB (shortness of breath) 05/22/2016  . Low energy 05/22/2016  . Subacute maxillary sinusitis 05/22/2016  . Chronic sinusitis 01/20/2016  . Prediabetes 07/21/2015  . Inguinal hernia 03/23/2014  . Essential hypertension 04/01/2008  . Aortic insufficiency 04/01/2008  . SKIN CANCER, HX OF 04/01/2008  . Hyperlipidemia 06/26/2006  . BENIGN PROSTATIC HYPERTROPHY 06/26/2006    Current Outpatient Prescriptions on File Prior to Visit  Medication Sig Dispense Refill  . Cholecalciferol (VITAMIN D) 2000 UNITS tablet Take 2,000 Units by mouth daily.    . Fluticasone-Salmeterol (ADVAIR) 100-50 MCG/DOSE AEPB Inhale 1 puff into the lungs 2 (two) times daily. 60 each 5  . hydrochlorothiazide (HYDRODIURIL) 12.5 MG tablet Take 1 tablet (12.5 mg total) by mouth daily. 90 tablet 3  . losartan (COZAAR) 100 MG tablet TAKE 1 TABLET DAILY 90 tablet 3  . Multiple Vitamin (MULTIVITAMIN WITH MINERALS) TABS tablet Take 1 tablet by mouth daily.    . propranolol (INDERAL) 40 MG tablet Take 0.5 tablets (20 mg total) by mouth 2 (two) times daily. 60 tablet 1   No current facility-administered medications on file prior to visit.     Past Medical History:  Diagnosis Date  . Arthritis   . Basal cell cancer    SKIN.Marland KitchenFACE AND HAND  . Benign prostatic hypertrophy   . Bronchitis    11/2013   . Chest pain 1992   NEGATIVE CATH  . Hyperglycemia    PMH of  . Hyperlipidemia   . Hypertension    . Pneumonia 2009   treated as OP  . Tachycardia    hx of per patient     Past Surgical History:  Procedure Laterality Date  . BASAL CELL CARCINOMA EXCISION     X 3  . cataract surgery      bilateral  . COLONOSCOPY  2003   [facility]  . HERNIA REPAIR  1972   Dr  Marylene Buerger  . INGUINAL HERNIA REPAIR Right 04/06/2014   Procedure: REPAIR RIGHT INGUINAL HERNIA WITH MESH;  Surgeon: Jackolyn Confer, MD;  Location: [facility];  Service: General;  Laterality: Right;  . INSERTION OF MESH Right 04/06/2014   Procedure: INSERTION OF MESH;  Surgeon: Jackolyn Confer, MD;  Location: [facility];  Service: General;  Laterality: Right;    Social History   Social History  . Marital status: Married    Spouse name: N/A  . Number of children: 2  . Years of education: 46   Occupational History  . [employer]    Social History Main Topics  . Smoking status: Never Smoker  . Smokeless tobacco: Never Used  . Alcohol use No  . Drug use: No  . Sexual activity: Not Currently   Other Topics Concern  . None   Social History Narrative   Denies abuse and feels safe at home.    Family History  Problem Relation  Age of Onset  . Alcohol abuse Brother   . Hypertension Mother   . Heart disease Mother     AF  . Transient ischemic attack Mother   . Heart attack Father 75  . Hypertension Father   . Lung disease Father     ? [facility] visitor)  . Cancer Neg Hx   . Diabetes Neg Hx     Review of Systems  Constitutional: Negative for fever.  HENT: Positive for congestion and sinus pressure (mild). Negative for sinus pain.   Respiratory: Positive for cough. Negative for shortness of breath and wheezing.   Cardiovascular: Negative for chest pain, palpitations and leg swelling.  Neurological: Negative for light-headedness and headaches.       Objective:   Vitals:   05/31/16 0903  BP: (!) 142/78  Pulse: 74  Temp: 97.5 F (36.4 C)   Wt Readings from Last 3 Encounters:  05/31/16 169 lb 1.3  oz (76.7 kg)  05/22/16 172 lb (78 kg)  05/22/16 171 lb (77.6 kg)   Body mass index is 26.48 kg/m.   Physical Exam    Constitutional: Appears well-developed and well-nourished. No distress.  HENT:  Head: Normocephalic and atraumatic.  Neck: Neck supple. No tracheal deviation present. No thyromegaly present.  No cervical lymphadenopathy Cardiovascular: Normal rate, regular rhythm and normal heart sounds.   No murmur heard. No carotid bruit .  No edema Pulmonary/Chest: Effort normal and breath sounds normal. No respiratory distress. No has no wheezes. No rales.  Skin: Skin is warm and dry. Not diaphoretic.  Psychiatric: Normal mood and affect. Behavior is normal.      Assessment & Plan:    See Problem List for Assessment and Plan of chronic medical problems.

## 2016-05-31 ENCOUNTER — Ambulatory Visit (INDEPENDENT_AMBULATORY_CARE_PROVIDER_SITE_OTHER): Payer: Medicare Other | Admitting: Internal Medicine

## 2016-05-31 ENCOUNTER — Encounter: Payer: Self-pay | Admitting: Internal Medicine

## 2016-05-31 VITALS — BP 142/78 | HR 74 | Temp 97.5°F | Ht 67.0 in | Wt 169.1 lb

## 2016-05-31 DIAGNOSIS — J0100 Acute maxillary sinusitis, unspecified: Secondary | ICD-10-CM | POA: Diagnosis not present

## 2016-05-31 DIAGNOSIS — I10 Essential (primary) hypertension: Secondary | ICD-10-CM | POA: Diagnosis not present

## 2016-05-31 MED ORDER — AMLODIPINE BESYLATE 2.5 MG PO TABS: 2.5000 mg | ORAL_TABLET | Freq: Every day | ORAL | 3 refills | Status: DC

## 2016-05-31 MED ORDER — AMOXICILLIN-POT CLAVULANATE 875-125 MG PO TABS: 1.0000 | ORAL_TABLET | Freq: Two times a day (BID) | ORAL | 0 refills | Status: DC

## 2016-05-31 NOTE — Patient Instructions (Signed)
Continue the antibiotic for an additional 5 days - this was sent to your pharmacy  Start amlodipine 2.5 mg daily.  Resume the doses of your old medications - see attached list.

## 2016-05-31 NOTE — Progress Notes (Signed)
Pre visit review using our clinic review tool, if applicable. No additional management support is needed unless otherwise documented below in the visit note. 

## 2016-05-31 NOTE — Assessment & Plan Note (Addendum)
Still with persistent sinus infection symptoms with discolored mucus, but some improvement Extend augmentin for additional 5 days Continue nasal sprays, otc cold meds as needed

## 2016-05-31 NOTE — Assessment & Plan Note (Signed)
Better - still elevated at home Start amlodipine 2.5 mg daily Go back to old medications doses -  hctz 12.5 mg  Losartan 100 mg daily Propranolol 20 mg BID  Continue to monitor at home -  follow up in June

## 2016-07-25 ENCOUNTER — Encounter: Payer: Self-pay | Admitting: Internal Medicine

## 2016-07-25 ENCOUNTER — Other Ambulatory Visit (INDEPENDENT_AMBULATORY_CARE_PROVIDER_SITE_OTHER): Payer: Medicare Other

## 2016-07-25 ENCOUNTER — Ambulatory Visit (INDEPENDENT_AMBULATORY_CARE_PROVIDER_SITE_OTHER): Payer: Medicare Other | Admitting: Internal Medicine

## 2016-07-25 VITALS — BP 144/88 | HR 68 | Temp 97.8°F | Resp 16 | Ht 67.0 in | Wt 171.0 lb

## 2016-07-25 DIAGNOSIS — E7800 Pure hypercholesterolemia, unspecified: Secondary | ICD-10-CM

## 2016-07-25 DIAGNOSIS — R7303 Prediabetes: Secondary | ICD-10-CM | POA: Diagnosis not present

## 2016-07-25 DIAGNOSIS — I10 Essential (primary) hypertension: Secondary | ICD-10-CM | POA: Diagnosis not present

## 2016-07-25 DIAGNOSIS — J309 Allergic rhinitis, unspecified: Secondary | ICD-10-CM | POA: Insufficient documentation

## 2016-07-25 DIAGNOSIS — J301 Allergic rhinitis due to pollen: Secondary | ICD-10-CM | POA: Diagnosis not present

## 2016-07-25 LAB — LIPID PANEL
Cholesterol: 175 mg/dL (ref 0–200)
HDL: 39.3 mg/dL (ref >39.00)
LDL Cholesterol: 114 mg/dL — ABNORMAL HIGH (ref 0–99)
NONHDL: 136
TRIGLYCERIDES: 110 mg/dL (ref 0.0–149.0)
Total CHOL/HDL Ratio: 4
VLDL: 22 mg/dL (ref 0.0–40.0)

## 2016-07-25 LAB — COMPREHENSIVE METABOLIC PANEL
ALBUMIN: 4.3 g/dL (ref 3.5–5.2)
ALK PHOS: 59 U/L (ref 39–117)
ALT: 27 U/L (ref 0–53)
AST: 27 U/L (ref 0–37)
BUN: 17 mg/dL (ref 6–23)
CHLORIDE: 103 meq/L (ref 96–112)
CO2: 28 mEq/L (ref 19–32)
Calcium: 10.1 mg/dL (ref 8.4–10.5)
Creatinine, Ser: 1.11 mg/dL (ref 0.40–1.50)
GFR: 66.65 mL/min (ref >60.00)
GLUCOSE: 120 mg/dL — AB (ref 70–99)
POTASSIUM: 4.5 meq/L (ref 3.5–5.1)
SODIUM: 138 meq/L (ref 135–145)
TOTAL PROTEIN: 7.4 g/dL (ref 6.0–8.3)
Total Bilirubin: 0.7 mg/dL (ref 0.2–1.2)

## 2016-07-25 LAB — CBC WITH DIFFERENTIAL/PLATELET
BASOS PCT: 0.6 % (ref 0.0–3.0)
Basophils Absolute: 0.1 10*3/uL (ref 0.0–0.1)
EOS PCT: 9.6 % — AB (ref 0.0–5.0)
Eosinophils Absolute: 0.9 10*3/uL — ABNORMAL HIGH (ref 0.0–0.7)
HCT: 46 % (ref 39.0–52.0)
HEMOGLOBIN: 15.8 g/dL (ref 13.0–17.0)
LYMPHS ABS: 1.9 10*3/uL (ref 0.7–4.0)
Lymphocytes Relative: 20.2 % (ref 12.0–46.0)
MCHC: 34.4 g/dL (ref 30.0–36.0)
MCV: 87.8 fl (ref 78.0–100.0)
MONO ABS: 0.8 10*3/uL (ref 0.1–1.0)
MONOS PCT: 8.3 % (ref 3.0–12.0)
NEUTROS PCT: 61.3 % (ref 43.0–77.0)
Neutro Abs: 5.7 10*3/uL (ref 1.4–7.7)
Platelets: 285 10*3/uL (ref 150.0–400.0)
RBC: 5.23 Mil/uL (ref 4.22–5.81)
RDW: 13.8 % (ref 11.5–15.5)
WBC: 9.2 10*3/uL (ref 4.0–10.5)

## 2016-07-25 LAB — HEMOGLOBIN A1C: HEMOGLOBIN A1C: 5.9 % (ref 4.6–6.5)

## 2016-07-25 LAB — TSH: TSH: 1.09 u[IU]/mL (ref 0.35–4.50)

## 2016-07-25 MED ORDER — PROPRANOLOL HCL 40 MG PO TABS: 20.0000 mg | ORAL_TABLET | Freq: Two times a day (BID) | ORAL | 3 refills | Status: DC

## 2016-07-25 MED ORDER — LOSARTAN POTASSIUM 100 MG PO TABS: 100.0000 mg | ORAL_TABLET | Freq: Every day | ORAL | 3 refills | Status: DC

## 2016-07-25 NOTE — Assessment & Plan Note (Signed)
Check a1c Low sugar / carb diet Stressed regular exercise, keeping weight down  

## 2016-07-25 NOTE — Assessment & Plan Note (Signed)
BP well controlled Current regimen effective and well tolerated Continue current medications at current doses Check labs 

## 2016-07-25 NOTE — Assessment & Plan Note (Signed)
Check lipid panel  Not on medication Regular exercise and healthy diet encouraged  

## 2016-07-25 NOTE — Assessment & Plan Note (Signed)
He is affected by the pollen and some of his symptoms are related to allergies Start zyrtec daily and continue to take -- if no improvement in his symptoms we will consider another round of augmentin for two weeks for possible chronic sinusitis

## 2016-07-25 NOTE — Patient Instructions (Addendum)
  Test(s) ordered today. Your results will be released to [place] (or called to you) after review, usually within 72hours after test completion. If any changes need to be made, you will be notified at that same time.  All other Health Maintenance issues reviewed.   All recommended immunizations and age-appropriate screenings are up-to-date or discussed.  No immunizations administered today.   Medications reviewed and updated.  Changes include start zyrtec nightly. Call if no improvement in your symptoms.   Your prescription(s) have been submitted to your pharmacy. Please take as directed and contact our office if you believe you are having problem(s) with the medication(s).   Please followup in 6 months

## 2016-07-25 NOTE — Progress Notes (Signed)
Subjective:    Patient ID: Christopher Richards, male    DOB: 03-25-1927, 83 y.o.   MRN: [id number]  HPI The patient is here for follow up.  Hypertension: He is taking his medication daily. He is compliant with a low sodium diet.  He denies chest pain, palpitations, edema, and regular headaches. He is not exercising regularly.  He does monitor his blood pressure at home - it has been overall well controlled.    Prediabetes:  He is compliant with a low sugar/carbohydrate diet.  He is not exercising regularly.  Hyperlipidemia: He was on Niaspan in the past, but is currently controlling his cholesterol lifestyle. He is compliant with a low fat/cholesterol diet. He is very active in home, but not exercising regularly.  He is coughing up phlegm, nasal congestion and has sinus drainage. His mucus is a light yellow.  He is taking HBP coricidan and mucinex daily. he has been told he has chronic bronchitis in the past and takes advair daily.  This does help.  He has some SOB that is chronic - it improved with using the advair twice daily.  He took zyrtec for three days and it helped, but he is not taking it daily.   Medications and allergies reviewed with patient and updated if appropriate.  Patient Active Problem List   Diagnosis Date Noted  . SOB (shortness of breath) 05/22/2016  . Low energy 05/22/2016  . Chronic sinusitis 01/20/2016  . Prediabetes 07/21/2015  . Inguinal hernia 03/23/2014  . Essential hypertension 04/01/2008  . Aortic insufficiency 04/01/2008  . SKIN CANCER, HX OF 04/01/2008  . Hyperlipidemia 06/26/2006  . BENIGN PROSTATIC HYPERTROPHY 06/26/2006    Current Outpatient Prescriptions on File Prior to Visit  Medication Sig Dispense Refill  . amLODipine (NORVASC) 2.5 MG tablet Take 1 tablet (2.5 mg total) by mouth daily. 90 tablet 3  . Cholecalciferol (VITAMIN D) 2000 UNITS tablet Take 2,000 Units by mouth daily.    . Fluticasone-Salmeterol (ADVAIR) 100-50 MCG/DOSE AEPB Inhale 1  puff into the lungs 2 (two) times daily. 60 each 5  . hydrochlorothiazide (HYDRODIURIL) 12.5 MG tablet Take 1 tablet (12.5 mg total) by mouth daily. 90 tablet 3  . losartan (COZAAR) 100 MG tablet TAKE 1 TABLET DAILY 90 tablet 3  . Multiple Vitamin (MULTIVITAMIN WITH MINERALS) TABS tablet Take 1 tablet by mouth daily.    . propranolol (INDERAL) 40 MG tablet Take 0.5 tablets (20 mg total) by mouth 2 (two) times daily. 60 tablet 1   No current facility-administered medications on file prior to visit.     Past Medical History:  Diagnosis Date  . Arthritis   . Basal cell cancer    SKIN.Marland KitchenFACE AND HAND  . Benign prostatic hypertrophy   . Bronchitis    11/2013   . Chest pain 1992   NEGATIVE CATH  . Hyperglycemia    PMH of  . Hyperlipidemia   . Hypertension   . Pneumonia 2009   treated as OP  . Tachycardia    hx of per patient     Past Surgical History:  Procedure Laterality Date  . BASAL CELL CARCINOMA EXCISION     X 3  . cataract surgery      bilateral  . COLONOSCOPY  2003   [facility]  . HERNIA REPAIR  1972   Dr  Marylene Buerger  . INGUINAL HERNIA REPAIR Right 04/06/2014   Procedure: REPAIR RIGHT INGUINAL HERNIA WITH MESH;  Surgeon: Jackolyn Confer, MD;  Location: [facility];  Service: General;  Laterality: Right;  . INSERTION OF MESH Right 04/06/2014   Procedure: INSERTION OF MESH;  Surgeon: Jackolyn Confer, MD;  Location: [facility];  Service: General;  Laterality: Right;    Social History   Social History  . Marital status: Married    Spouse name: N/A  . Number of children: 2  . Years of education: 74   Occupational History  . [employer]    Social History Main Topics  . Smoking status: Never Smoker  . Smokeless tobacco: Never Used  . Alcohol use No  . Drug use: No  . Sexual activity: Not Currently   Other Topics Concern  . None   Social History Narrative   Denies abuse and feels safe at home.    Family History  Problem Relation Age of Onset  . Alcohol abuse Brother    . Hypertension Mother   . Heart disease Mother        AF  . Transient ischemic attack Mother   . Heart attack Father 85  . Hypertension Father   . Lung disease Father        ? [facility] visitor)  . Cancer Neg Hx   . Diabetes Neg Hx     Review of Systems  Constitutional: Negative for chills and fever.  HENT: Positive for congestion, postnasal drip, rhinorrhea and sinus pressure. Negative for ear pain, sinus pain and sore throat.   Respiratory: Positive for cough, shortness of breath and wheezing (rare).   Cardiovascular: Negative for chest pain, palpitations and leg swelling.  Neurological: Negative for dizziness, light-headedness and headaches.       Objective:   Vitals:   07/25/16 0821  BP: (!) 144/88  Pulse: 68  Resp: 16  Temp: 97.8 F (36.6 C)   Wt Readings from Last 3 Encounters:  07/25/16 171 lb (77.6 kg)  05/31/16 169 lb 1.3 oz (76.7 kg)  05/22/16 172 lb (78 kg)   Body mass index is 26.78 kg/m.   Physical Exam    GENERAL APPEARANCE: Appears stated age, well appearing, NAD EYES: conjunctiva clear, no icterus HEENT: bilateral tympanic membranes and ear canals normal, oropharynx with mild erythema, no thyromegaly, trachea midline, no cervical or supraclavicular lymphadenopathy LUNGS: Clear to auscultation without wheeze or crackles, unlabored breathing, good air entry bilaterally HEART: Normal S1,S2 without murmurs EXTREMITIES: Without clubbing, cyanosis, or edema      Assessment & Plan:    See Problem List for Assessment and Plan of chronic medical problems.

## 2016-07-26 ENCOUNTER — Encounter: Payer: Self-pay | Admitting: Internal Medicine

## 2016-08-04 ENCOUNTER — Telehealth: Payer: Self-pay | Admitting: Surgery

## 2016-08-04 NOTE — Telephone Encounter (Signed)
Christopher Richards had a cyst of his neck lanced several years ago by Dr. Hassell Done.  I spoke to his daughter, [name].  This area has flared back up.  He will put warm compresses over the weekend. He will call our office Monday to be seen.  Alphonsa Overall, MD, [facility] Pager: [phone] Office phone:  [phone]

## 2016-08-06 DIAGNOSIS — L0211 Cutaneous abscess of neck: Secondary | ICD-10-CM | POA: Diagnosis not present

## 2017-01-15 DIAGNOSIS — C44622 Squamous cell carcinoma of skin of right upper limb, including shoulder: Secondary | ICD-10-CM | POA: Diagnosis not present

## 2017-01-15 DIAGNOSIS — L570 Actinic keratosis: Secondary | ICD-10-CM | POA: Diagnosis not present

## 2017-01-15 DIAGNOSIS — X32XXXD Exposure to sunlight, subsequent encounter: Secondary | ICD-10-CM | POA: Diagnosis not present

## 2017-01-24 NOTE — Progress Notes (Addendum)
Subjective:    Patient ID: Christopher Richards, male    DOB: [birth year], 82 y.o.   MRN: [id number]  HPI The patient is here for follow up.  Hypertension: He is taking his medication daily. He is compliant with a low sodium diet.  He denies chest pain, palpitations, edema, shortness of breath and regular headaches. He is exercising regularly.  He does monitor his blood pressure at home - averaging 140/84.    Prediabetes:  He is compliant with a low sugar/carbohydrate diet.  He is exercising regularly.  Hyperlipidemia: He is taking his medication daily. He is compliant with a low fat/cholesterol diet. He is exercising regularly. He denies myalgias.    Sinusitis:  He has had some yellow nasal discharge for the past week.  He has intermittent sinus pressure.    Medications and allergies reviewed with patient and updated if appropriate.  Patient Active Problem List   Diagnosis Date Noted  . Sinusitis 01/25/2017  . Allergic rhinitis 07/25/2016  . SOB (shortness of breath) 05/22/2016  . Low energy 05/22/2016  . Chronic sinusitis 01/20/2016  . Prediabetes 07/21/2015  . Inguinal hernia 03/23/2014  . Essential hypertension 04/01/2008  . Aortic insufficiency 04/01/2008  . SKIN CANCER, HX OF 04/01/2008  . Hyperlipidemia 06/26/2006  . BENIGN PROSTATIC HYPERTROPHY 06/26/2006    Current Outpatient Medications on File Prior to Visit  Medication Sig Dispense Refill  . Cholecalciferol (VITAMIN D) 2000 UNITS tablet Take 2,000 Units by mouth daily.    . Fluticasone-Salmeterol (ADVAIR) 100-50 MCG/DOSE AEPB Inhale 1 puff into the lungs 2 (two) times daily. 60 each 5  . Multiple Vitamin (MULTIVITAMIN WITH MINERALS) TABS tablet Take 1 tablet by mouth daily.     No current facility-administered medications on file prior to visit.     Past Medical History:  Diagnosis Date  . Arthritis   . Basal cell cancer    SKIN.Marland KitchenFACE AND HAND  . Benign prostatic hypertrophy   . Bronchitis    11/2013   .  Chest pain 1992   NEGATIVE CATH  . Hyperglycemia    PMH of  . Hyperlipidemia   . Hypertension   . Pneumonia 2009   treated as OP  . Tachycardia    hx of per patient     Past Surgical History:  Procedure Laterality Date  . BASAL CELL CARCINOMA EXCISION     X 3  . cataract surgery      bilateral  . COLONOSCOPY  2003   [facility]  . HERNIA REPAIR  1972   Dr  Marylene Buerger  . INGUINAL HERNIA REPAIR Right 04/06/2014   Procedure: REPAIR RIGHT INGUINAL HERNIA WITH MESH;  Surgeon: Jackolyn Confer, MD;  Location: [facility];  Service: General;  Laterality: Right;  . INSERTION OF MESH Right 04/06/2014   Procedure: INSERTION OF MESH;  Surgeon: Jackolyn Confer, MD;  Location: [facility];  Service: General;  Laterality: Right;    Social History   Socioeconomic History  . Marital status: Married    Spouse name: None  . Number of children: 2  . Years of education: 77  . Highest education level: None  Social Needs  . Financial resource strain: None  . Food insecurity - worry: None  . Food insecurity - inability: None  . Transportation needs - medical: None  . Transportation needs - non-medical: None  Occupational History  . Occupation: [employer]  Tobacco Use  . Smoking status: Never Smoker  . Smokeless tobacco: Never Used  Substance and Sexual Activity  . Alcohol use: No  . Drug use: No  . Sexual activity: Not Currently  Other Topics Concern  . None  Social History Narrative   Denies abuse and feels safe at home.    Family History  Problem Relation Age of Onset  . Alcohol abuse Brother   . Hypertension Mother   . Heart disease Mother        AF  . Transient ischemic attack Mother   . Heart attack Father 18  . Hypertension Father   . Lung disease Father        ? [facility] visitor)  . Cancer Neg Hx   . Diabetes Neg Hx     Review of Systems  Constitutional: Negative for chills and fever.  HENT: Positive for congestion (yellow mucus) and sinus pressure (intermittent).  Negative for ear pain and sore throat.   Respiratory: Positive for cough (mild, dry). Negative for shortness of breath and wheezing.   Cardiovascular: Negative for chest pain, palpitations and leg swelling.  Neurological: Negative for dizziness, light-headedness and headaches.       Objective:   Vitals:   01/25/17 0847  BP: 132/88  Pulse: 83  Resp: 16  Temp: 97.7 F (36.5 C)  SpO2: 96%   Wt Readings from Last 3 Encounters:  01/25/17 170 lb (77.1 kg)  07/25/16 171 lb (77.6 kg)  05/31/16 169 lb 1.3 oz (76.7 kg)   Body mass index is 26.63 kg/m.   Physical Exam    Constitutional: Appears well-developed and well-nourished. No distress.  HENT:  Head: Normocephalic and atraumatic.  Neck: Neck supple. No tracheal deviation present. No thyromegaly present.  No cervical lymphadenopathy Cardiovascular: Normal rate, regular rhythm and normal heart sounds.   No murmur heard. No carotid bruit .  No edema Pulmonary/Chest: Effort normal and breath sounds normal. No respiratory distress. No has no wheezes. No rales.  Skin: Skin is warm and dry. Not diaphoretic.  Psychiatric: Normal mood and affect. Behavior is normal.      Assessment & Plan:    See Problem List for Assessment and Plan of chronic medical problems.

## 2017-01-25 ENCOUNTER — Ambulatory Visit (INDEPENDENT_AMBULATORY_CARE_PROVIDER_SITE_OTHER): Payer: Medicare Other | Admitting: Internal Medicine

## 2017-01-25 ENCOUNTER — Encounter: Payer: Self-pay | Admitting: Internal Medicine

## 2017-01-25 ENCOUNTER — Other Ambulatory Visit (INDEPENDENT_AMBULATORY_CARE_PROVIDER_SITE_OTHER): Payer: Medicare Other

## 2017-01-25 VITALS — BP 132/88 | HR 83 | Temp 97.7°F | Resp 16 | Wt 170.0 lb

## 2017-01-25 DIAGNOSIS — R7303 Prediabetes: Secondary | ICD-10-CM

## 2017-01-25 DIAGNOSIS — E7849 Other hyperlipidemia: Secondary | ICD-10-CM

## 2017-01-25 DIAGNOSIS — J329 Chronic sinusitis, unspecified: Secondary | ICD-10-CM | POA: Insufficient documentation

## 2017-01-25 DIAGNOSIS — I10 Essential (primary) hypertension: Secondary | ICD-10-CM | POA: Diagnosis not present

## 2017-01-25 DIAGNOSIS — J0100 Acute maxillary sinusitis, unspecified: Secondary | ICD-10-CM

## 2017-01-25 LAB — LIPID PANEL
CHOLESTEROL: 164 mg/dL (ref 0–200)
HDL: 36.4 mg/dL — ABNORMAL LOW (ref >39.00)
LDL CALC: 103 mg/dL — AB (ref 0–99)
NonHDL: 127.87
Total CHOL/HDL Ratio: 5
Triglycerides: 125 mg/dL (ref 0.0–149.0)
VLDL: 25 mg/dL (ref 0.0–40.0)

## 2017-01-25 LAB — COMPREHENSIVE METABOLIC PANEL
ALK PHOS: 68 U/L (ref 39–117)
ALT: 21 U/L (ref 0–53)
AST: 24 U/L (ref 0–37)
Albumin: 4.3 g/dL (ref 3.5–5.2)
BUN: 17 mg/dL (ref 6–23)
CO2: 29 meq/L (ref 19–32)
Calcium: 9.6 mg/dL (ref 8.4–10.5)
Chloride: 100 mEq/L (ref 96–112)
Creatinine, Ser: 1.08 mg/dL (ref 0.40–1.50)
GFR: 68.71 mL/min (ref >60.00)
GLUCOSE: 122 mg/dL — AB (ref 70–99)
POTASSIUM: 4.4 meq/L (ref 3.5–5.1)
SODIUM: 136 meq/L (ref 135–145)
TOTAL PROTEIN: 7.3 g/dL (ref 6.0–8.3)
Total Bilirubin: 0.9 mg/dL (ref 0.2–1.2)

## 2017-01-25 LAB — HEMOGLOBIN A1C: HEMOGLOBIN A1C: 5.8 % (ref 4.6–6.5)

## 2017-01-25 MED ORDER — AMLODIPINE BESYLATE 2.5 MG PO TABS: 2.5000 mg | ORAL_TABLET | Freq: Every day | ORAL | 3 refills | Status: DC

## 2017-01-25 MED ORDER — LOSARTAN POTASSIUM 100 MG PO TABS: 100.0000 mg | ORAL_TABLET | Freq: Every day | ORAL | 3 refills | Status: DC

## 2017-01-25 MED ORDER — PROPRANOLOL HCL 40 MG PO TABS: 20.0000 mg | ORAL_TABLET | Freq: Two times a day (BID) | ORAL | 3 refills | Status: DC

## 2017-01-25 MED ORDER — HYDROCHLOROTHIAZIDE 12.5 MG PO TABS: 12.5000 mg | ORAL_TABLET | Freq: Every day | ORAL | 3 refills | Status: DC

## 2017-01-25 NOTE — Assessment & Plan Note (Signed)
Sinus congestion, intermittent pressure and some yellow nasal discharge Wil continue humidifier and otc cold meds Call if no improvement and will consider antibiotic

## 2017-01-25 NOTE — Patient Instructions (Addendum)

## 2017-01-27 ENCOUNTER — Encounter: Payer: Self-pay | Admitting: Internal Medicine

## 2017-02-04 NOTE — Assessment & Plan Note (Signed)
Blood pressure adequately controlled Continue current medications Continue regular exercise Check CMP

## 2017-02-04 NOTE — Assessment & Plan Note (Signed)
Compliant with a diabetic diet Continue regular exercise Check A1c Follow-up in 6 months

## 2017-02-04 NOTE — Assessment & Plan Note (Signed)
Not currently on medication Exercising regularly and eating a healthy diet Check CMP, lipid panel

## 2017-03-01 ENCOUNTER — Other Ambulatory Visit: Payer: Self-pay | Admitting: Emergency Medicine

## 2017-03-01 MED ORDER — FLUTICASONE-SALMETEROL 100-50 MCG/DOSE IN AEPB: 1.0000 | INHALATION_SPRAY | Freq: Two times a day (BID) | RESPIRATORY_TRACT | 2 refills | Status: DC

## 2017-03-05 DIAGNOSIS — Z85828 Personal history of other malignant neoplasm of skin: Secondary | ICD-10-CM | POA: Diagnosis not present

## 2017-03-05 DIAGNOSIS — Z08 Encounter for follow-up examination after completed treatment for malignant neoplasm: Secondary | ICD-10-CM | POA: Diagnosis not present

## 2017-03-13 ENCOUNTER — Encounter: Payer: Self-pay | Admitting: Internal Medicine

## 2017-03-13 ENCOUNTER — Ambulatory Visit (INDEPENDENT_AMBULATORY_CARE_PROVIDER_SITE_OTHER): Payer: Medicare Other | Admitting: Internal Medicine

## 2017-03-13 VITALS — BP 140/86 | HR 90 | Temp 98.2°F | Resp 16 | Wt 170.0 lb

## 2017-03-13 DIAGNOSIS — J0100 Acute maxillary sinusitis, unspecified: Secondary | ICD-10-CM

## 2017-03-13 DIAGNOSIS — J209 Acute bronchitis, unspecified: Secondary | ICD-10-CM | POA: Diagnosis not present

## 2017-03-13 MED ORDER — BENZONATATE 200 MG PO CAPS
200.0000 mg | ORAL_CAPSULE | Freq: Three times a day (TID) | ORAL | 0 refills | Status: DC | PRN
Start: 2017-03-13 — End: 2017-07-25

## 2017-03-13 MED ORDER — PREDNISONE 20 MG PO TABS: 40.0000 mg | ORAL_TABLET | Freq: Every day | ORAL | 0 refills | Status: DC

## 2017-03-13 MED ORDER — SULFAMETHOXAZOLE-TRIMETHOPRIM 800-160 MG PO TABS: 1.0000 | ORAL_TABLET | Freq: Two times a day (BID) | ORAL | 0 refills | Status: DC

## 2017-03-13 NOTE — Progress Notes (Signed)
Subjective:    Patient ID: Christopher Richards, male    DOB: 10-22-1927, 84 y.o.   MRN: [id number]  HPI He is here for an acute visit for cold symptoms.  His symptoms started a few weeks ago and it got worse last week.  He feels he has had symptoms for such a long time.  He is experiencing fatigue, subjective fevers, nasal congestion with yellow mucus, postnasal drip, sinus pain and pressure, productive cough with gray sputum, shortness of breath and wheezing.  He denies any changes in appetite, ear pain, sore throat, chest tightness, GI symptoms, body aches and headaches.  He has tried taking coricidin cold products.  He is using the advair twice daily.     Medications and allergies reviewed with patient and updated if appropriate.  Patient Active Problem List   Diagnosis Date Noted  . Sinusitis 01/25/2017  . Allergic rhinitis 07/25/2016  . SOB (shortness of breath) 05/22/2016  . Low energy 05/22/2016  . Chronic sinusitis 01/20/2016  . Prediabetes 07/21/2015  . Inguinal hernia 03/23/2014  . Essential hypertension 04/01/2008  . Aortic insufficiency 04/01/2008  . SKIN CANCER, HX OF 04/01/2008  . Hyperlipidemia 06/26/2006  . BENIGN PROSTATIC HYPERTROPHY 06/26/2006    Current Outpatient Medications on File Prior to Visit  Medication Sig Dispense Refill  . amLODipine (NORVASC) 2.5 MG tablet Take 1 tablet (2.5 mg total) by mouth daily. 90 tablet 3  . Cholecalciferol (VITAMIN D) 2000 UNITS tablet Take 2,000 Units by mouth daily.    . Fluticasone-Salmeterol (ADVAIR) 100-50 MCG/DOSE AEPB Inhale 1 puff into the lungs 2 (two) times daily. 60 each 2  . hydrochlorothiazide (HYDRODIURIL) 12.5 MG tablet Take 1 tablet (12.5 mg total) by mouth daily. 90 tablet 3  . losartan (COZAAR) 100 MG tablet Take 1 tablet (100 mg total) by mouth daily. 90 tablet 3  . Multiple Vitamin (MULTIVITAMIN WITH MINERALS) TABS tablet Take 1 tablet by mouth daily.    . propranolol (INDERAL) 40 MG tablet Take 0.5  tablets (20 mg total) by mouth 2 (two) times daily. 180 tablet 3   No current facility-administered medications on file prior to visit.     Past Medical History:  Diagnosis Date  . Arthritis   . Basal cell cancer    SKIN.Marland KitchenFACE AND HAND  . Benign prostatic hypertrophy   . Bronchitis    11/2013   . Chest pain 1992   NEGATIVE CATH  . Hyperglycemia    PMH of  . Hyperlipidemia   . Hypertension   . Pneumonia 2009   treated as OP  . Tachycardia    hx of per patient     Past Surgical History:  Procedure Laterality Date  . BASAL CELL CARCINOMA EXCISION     X 3  . cataract surgery      bilateral  . COLONOSCOPY  2003   [facility]  . HERNIA REPAIR  1972   Dr  Marylene Buerger  . INGUINAL HERNIA REPAIR Right 04/06/2014   Procedure: REPAIR RIGHT INGUINAL HERNIA WITH MESH;  Surgeon: Jackolyn Confer, MD;  Location: [facility];  Service: General;  Laterality: Right;  . INSERTION OF MESH Right 04/06/2014   Procedure: INSERTION OF MESH;  Surgeon: Jackolyn Confer, MD;  Location: [facility];  Service: General;  Laterality: Right;    Social History   Socioeconomic History  . Marital status: Married    Spouse name: None  . Number of children: 2  . Years of education: 6  .  Highest education level: None  Social Needs  . Financial resource strain: None  . Food insecurity - worry: None  . Food insecurity - inability: None  . Transportation needs - medical: None  . Transportation needs - non-medical: None  Occupational History  . Occupation: [employer]  Tobacco Use  . Smoking status: Never Smoker  . Smokeless tobacco: Never Used  Substance and Sexual Activity  . Alcohol use: No  . Drug use: No  . Sexual activity: Not Currently  Other Topics Concern  . None  Social History Narrative   Denies abuse and feels safe at home.    Family History  Problem Relation Age of Onset  . Alcohol abuse Brother   . Hypertension Mother   . Heart disease Mother        AF  . Transient ischemic attack Mother    . Heart attack Father 81  . Hypertension Father   . Lung disease Father        ? [facility] visitor)  . Cancer Neg Hx   . Diabetes Neg Hx     Review of Systems  Constitutional: Positive for fatigue and fever. Negative for appetite change.  HENT: Positive for congestion (yellow mucus), postnasal drip, sinus pressure and sinus pain. Negative for ear pain and sore throat.   Respiratory: Positive for cough (dark gray sputum), shortness of breath and wheezing. Negative for chest tightness.   Gastrointestinal: Negative for diarrhea and nausea.  Musculoskeletal: Negative for myalgias.  Neurological: Negative for light-headedness and headaches.       Objective:   Vitals:   03/13/17 1007  BP: 140/86  Pulse: 90  Resp: 16  Temp: 98.2 F (36.8 C)  SpO2: 94%   Filed Weights   03/13/17 1007  Weight: 170 lb (77.1 kg)   Body mass index is 26.63 kg/m.  Wt Readings from Last 3 Encounters:  03/13/17 170 lb (77.1 kg)  01/25/17 170 lb (77.1 kg)  07/25/16 171 lb (77.6 kg)     Physical Exam GENERAL APPEARANCE: Appears stated age, well appearing, NAD EYES: conjunctiva clear, no icterus HEENT: bilateral tympanic membranes and ear canals normal, oropharynx with mild erythema, no thyromegaly, trachea midline, no cervical or supraclavicular lymphadenopathy LUNGS: unlabored breathing, good air entry bilaterally, diffuse expiratory wheeze, no crackles CARDIOVASCULAR: Normal S1,S2 without murmurs, no edema SKIN: warm, dry        Assessment & Plan:   See Problem List for Assessment and Plan of chronic medical problems.

## 2017-03-13 NOTE — Assessment & Plan Note (Signed)
His symptoms and exam are consistent with acute bacterial bronchitis with bronchospasm/reactive airway disease He is taking Advair twice daily, has diffuse expiratory wheeze on exam We will start an antibiotic-Bactrim twice daily times 14 days We will also start on prednisone for 5 days, 40 mg daily Tessalon Perles Continue Advair Rest, fluids Continue Coricidin cold products as needed Call if no improvement

## 2017-03-13 NOTE — Patient Instructions (Signed)
Take the antibiotic, prednisone and cough medication as prescribed.  Continue the coricidin as needed.  Continue increased rest and fluids.   Call if no improvement

## 2017-03-13 NOTE — Assessment & Plan Note (Signed)
Symptoms consistent with bacterial sinus infection-symptoms have been present for weeks, but did worsen recently He is a concerned about having a more chronic sinus infection and we did discuss this-may need to see ENT if his symptoms do not completely resolved We will start Bactrim twice daily times 14 days Can continue Coricidin cold products Call if no improvement

## 2017-05-27 ENCOUNTER — Other Ambulatory Visit: Payer: Self-pay | Admitting: Emergency Medicine

## 2017-05-27 MED ORDER — FLUTICASONE-SALMETEROL 100-50 MCG/DOSE IN AEPB: 1.0000 | INHALATION_SPRAY | Freq: Two times a day (BID) | RESPIRATORY_TRACT | 2 refills | Status: DC

## 2017-05-28 ENCOUNTER — Ambulatory Visit: Payer: Medicare Other

## 2017-06-13 ENCOUNTER — Telehealth: Payer: Self-pay | Admitting: Emergency Medicine

## 2017-06-13 NOTE — Telephone Encounter (Signed)
Called patient to reschedule AWV appt that he cancelled on 05/28/17. Patient declined at this time and stated he would call back.

## 2017-07-25 NOTE — Patient Instructions (Addendum)
  Test(s) ordered today. Your results will be released to MyChart (or called to you) after review, usually within 72hours after test completion. If any changes need to be made, you will be notified at that same time.  Medications reviewed and updated.  No changes recommended at this time.    Please followup in 6 months   

## 2017-07-25 NOTE — Progress Notes (Signed)
Subjective:    Patient ID: Christopher Richards, male    DOB: Jan 23, 1928, 84 y.o.   MRN: [id number]  HPI The patient is here for follow up.  Hypertension: He is taking his medication daily. He is compliant with a low sodium diet.  He denies chest pain, palpitations, edema, shortness of breath and regular headaches. He is not exercising regularly, but will restart walking.  He does not monitor his blood pressure at home.    Prediabetes:  He is compliant with a low sugar/carbohydrate diet.  He is not exercising regularly, but will started walking again.  Hyperlipidemia: He is taking his medication daily. He is compliant with a low fat/cholesterol diet. He is not exercising regularly. He denies myalgias.    Medications and allergies reviewed with patient and updated if appropriate.  Patient Active Problem List   Diagnosis Date Noted  . Sinusitis 01/25/2017  . Allergic rhinitis 07/25/2016  . SOB (shortness of breath) 05/22/2016  . Low energy 05/22/2016  . Acute maxillary sinusitis 05/22/2016  . Chronic sinusitis 01/20/2016  . Prediabetes 07/21/2015  . Acute bronchitis with bronchospasm 05/05/2015  . Inguinal hernia 03/23/2014  . Essential hypertension 04/01/2008  . Aortic insufficiency 04/01/2008  . SKIN CANCER, HX OF 04/01/2008  . Hyperlipidemia 06/26/2006  . BENIGN PROSTATIC HYPERTROPHY 06/26/2006    Current Outpatient Medications on File Prior to Visit  Medication Sig Dispense Refill  . amLODipine (NORVASC) 2.5 MG tablet Take 1 tablet (2.5 mg total) by mouth daily. 90 tablet 3  . Cholecalciferol (VITAMIN D) 2000 UNITS tablet Take 2,000 Units by mouth daily.    . Fluticasone-Salmeterol (ADVAIR) 100-50 MCG/DOSE AEPB Inhale 1 puff into the lungs 2 (two) times daily. 60 each 2  . hydrochlorothiazide (HYDRODIURIL) 12.5 MG tablet Take 1 tablet (12.5 mg total) by mouth daily. 90 tablet 3  . losartan (COZAAR) 100 MG tablet Take 1 tablet (100 mg total) by mouth daily. 90 tablet 3  .  Multiple Vitamin (MULTIVITAMIN WITH MINERALS) TABS tablet Take 1 tablet by mouth daily.    . propranolol (INDERAL) 40 MG tablet Take 0.5 tablets (20 mg total) by mouth 2 (two) times daily. 180 tablet 3  . sulfamethoxazole-trimethoprim (BACTRIM DS,SEPTRA DS) 800-160 MG tablet Take 1 tablet by mouth 2 (two) times daily. 28 tablet 0   No current facility-administered medications on file prior to visit.     Past Medical History:  Diagnosis Date  . Arthritis   . Basal cell cancer    SKIN.Marland KitchenFACE AND HAND  . Benign prostatic hypertrophy   . Bronchitis    11/2013   . Chest pain 1992   NEGATIVE CATH  . Hyperglycemia    PMH of  . Hyperlipidemia   . Hypertension   . Pneumonia 2009   treated as OP  . Tachycardia    hx of per patient     Past Surgical History:  Procedure Laterality Date  . BASAL CELL CARCINOMA EXCISION     X 3  . cataract surgery      bilateral  . COLONOSCOPY  2003   [facility]  . HERNIA REPAIR  1972   Dr  Marylene Buerger  . INGUINAL HERNIA REPAIR Right 04/06/2014   Procedure: REPAIR RIGHT INGUINAL HERNIA WITH MESH;  Surgeon: Jackolyn Confer, MD;  Location: [facility];  Service: General;  Laterality: Right;  . INSERTION OF MESH Right 04/06/2014   Procedure: INSERTION OF MESH;  Surgeon: Jackolyn Confer, MD;  Location: [facility];  Service: General;  Laterality: Right;    Social History   Socioeconomic History  . Marital status: Married    Spouse name: Not on file  . Number of children: 2  . Years of education: 29  . Highest education level: Not on file  Occupational History  . Occupation: [employer]  Social Needs  . Financial resource strain: Not on file  . Food insecurity:    Worry: Not on file    Inability: Not on file  . Transportation needs:    Medical: Not on file    Non-medical: Not on file  Tobacco Use  . Smoking status: Never Smoker  . Smokeless tobacco: Never Used  Substance and Sexual Activity  . Alcohol use: No  . Drug use: No  . Sexual activity: Not  Currently  Lifestyle  . Physical activity:    Days per week: Not on file    Minutes per session: Not on file  . Stress: Not on file  Relationships  . Social connections:    Talks on phone: Not on file    Gets together: Not on file    Attends religious service: Not on file    Active member of club or organization: Not on file    Attends meetings of clubs or organizations: Not on file    Relationship status: Not on file  Other Topics Concern  . Not on file  Social History Narrative   Denies abuse and feels safe at home.    Family History  Problem Relation Age of Onset  . Alcohol abuse Brother   . Hypertension Mother   . Heart disease Mother        AF  . Transient ischemic attack Mother   . Heart attack Father 56  . Hypertension Father   . Lung disease Father        ? [facility] visitor)  . Cancer Neg Hx   . Diabetes Neg Hx     Review of Systems  Constitutional: Negative for fatigue and fever.  Respiratory: Positive for shortness of breath (due to not exercising). Negative for cough and wheezing.   Cardiovascular: Negative for chest pain, palpitations and leg swelling.  Neurological: Negative for dizziness, light-headedness and headaches.       Objective:   Vitals:   07/26/17 0838  BP: (!) 148/78  Pulse: 71  Resp: 16  Temp: 98.1 F (36.7 C)  SpO2: 97%   BP Readings from Last 3 Encounters:  07/26/17 (!) 148/78  03/13/17 140/86  01/25/17 132/88   Wt Readings from Last 3 Encounters:  07/26/17 171 lb (77.6 kg)  03/13/17 170 lb (77.1 kg)  01/25/17 170 lb (77.1 kg)   Body mass index is 26.78 kg/m.   Physical Exam    Constitutional: Appears well-developed and well-nourished. No distress.  HENT:  Head: Normocephalic and atraumatic.  Neck: Neck supple. No tracheal deviation present. No thyromegaly present.  No cervical lymphadenopathy Cardiovascular: Normal rate, regular rhythm and normal heart sounds.   1/6 systolic murmur heard. No carotid  bruit .  No edema Pulmonary/Chest: Effort normal and breath sounds normal. No respiratory distress. No has no wheezes. No rales.  Skin: Skin is warm and dry. Not diaphoretic.  Psychiatric: Normal mood and affect. Behavior is normal.      Assessment & Plan:    See Problem List for Assessment and Plan of chronic medical problems.

## 2017-07-26 ENCOUNTER — Encounter: Payer: Self-pay | Admitting: Internal Medicine

## 2017-07-26 ENCOUNTER — Ambulatory Visit (INDEPENDENT_AMBULATORY_CARE_PROVIDER_SITE_OTHER): Payer: Medicare Other | Admitting: Internal Medicine

## 2017-07-26 ENCOUNTER — Other Ambulatory Visit (INDEPENDENT_AMBULATORY_CARE_PROVIDER_SITE_OTHER): Payer: Medicare Other

## 2017-07-26 VITALS — BP 148/78 | HR 71 | Temp 98.1°F | Resp 16 | Wt 171.0 lb

## 2017-07-26 DIAGNOSIS — I10 Essential (primary) hypertension: Secondary | ICD-10-CM

## 2017-07-26 DIAGNOSIS — R7303 Prediabetes: Secondary | ICD-10-CM

## 2017-07-26 DIAGNOSIS — E7849 Other hyperlipidemia: Secondary | ICD-10-CM | POA: Diagnosis not present

## 2017-07-26 LAB — COMPREHENSIVE METABOLIC PANEL
ALBUMIN: 4.3 g/dL (ref 3.5–5.2)
ALK PHOS: 62 U/L (ref 39–117)
ALT: 26 U/L (ref 0–53)
AST: 28 U/L (ref 0–37)
BILIRUBIN TOTAL: 0.7 mg/dL (ref 0.2–1.2)
BUN: 18 mg/dL (ref 6–23)
CO2: 28 mEq/L (ref 19–32)
Calcium: 10 mg/dL (ref 8.4–10.5)
Chloride: 102 mEq/L (ref 96–112)
Creatinine, Ser: 1.14 mg/dL (ref 0.40–1.50)
GFR: 64.48 mL/min (ref >60.00)
Glucose, Bld: 123 mg/dL — ABNORMAL HIGH (ref 70–99)
POTASSIUM: 4.6 meq/L (ref 3.5–5.1)
Sodium: 139 mEq/L (ref 135–145)
TOTAL PROTEIN: 7.5 g/dL (ref 6.0–8.3)

## 2017-07-26 LAB — LIPID PANEL
CHOLESTEROL: 189 mg/dL (ref 0–200)
HDL: 37.7 mg/dL — AB (ref >39.00)
LDL Cholesterol: 125 mg/dL — ABNORMAL HIGH (ref 0–99)
NonHDL: 151.11
Total CHOL/HDL Ratio: 5
Triglycerides: 129 mg/dL (ref 0.0–149.0)
VLDL: 25.8 mg/dL (ref 0.0–40.0)

## 2017-07-26 LAB — HEMOGLOBIN A1C: HEMOGLOBIN A1C: 5.7 % (ref 4.6–6.5)

## 2017-07-26 NOTE — Assessment & Plan Note (Signed)
BP slightly elevated today He will get back to regular walking which will likely decrease his BP Will monitor for now Continue current medications cmp

## 2017-07-26 NOTE — Assessment & Plan Note (Signed)
Check lipid panel  Was on niaspan in past, not on any medication Regular exercise and healthy diet encouraged

## 2017-07-26 NOTE — Assessment & Plan Note (Signed)
Check a1c Low sugar / carb diet Stressed regular exercise   

## 2017-08-12 ENCOUNTER — Other Ambulatory Visit: Payer: Self-pay | Admitting: Internal Medicine

## 2017-09-19 ENCOUNTER — Other Ambulatory Visit: Payer: Self-pay

## 2017-10-02 ENCOUNTER — Other Ambulatory Visit: Payer: Self-pay | Admitting: Internal Medicine

## 2017-10-26 ENCOUNTER — Other Ambulatory Visit: Payer: Self-pay | Admitting: Internal Medicine

## 2017-11-23 ENCOUNTER — Other Ambulatory Visit: Payer: Self-pay | Admitting: Internal Medicine

## 2018-01-03 ENCOUNTER — Encounter: Payer: Self-pay | Admitting: Internal Medicine

## 2018-01-03 NOTE — Telephone Encounter (Signed)
Documented flu shot.../LMB  

## 2018-01-23 NOTE — Progress Notes (Signed)
Subjective:    Patient ID: Christopher Richards, male    DOB: 01-16-1928, 84 y.o.   MRN: [id number]  HPI The patient is here for follow up.    Hypertension: He is taking his medication daily. He is compliant with a low sodium diet.  He denies chest pain, palpitations, edema, shortness of breath and regular headaches. He is exercising regularly - walking.  He does monitor his blood pressure at home - 140-145/70's.    Hyperlipidemia: He is taking his medication daily. He is compliant with a low fat/cholesterol diet. He is exercising regularly. He denies myalgias.   Prediabetes:  He is compliant with a low sugar/carbohydrate diet.  He is exercising regularly.  Left 2-3rd fingers are locking up.  Often occurs after doing an activity.    Medications and allergies reviewed with patient and updated if appropriate.  Patient Active Problem List   Diagnosis Date Noted  . Allergic rhinitis 07/25/2016  . SOB (shortness of breath) 05/22/2016  . Low energy 05/22/2016  . Chronic sinusitis 01/20/2016  . Prediabetes 07/21/2015  . Inguinal hernia 03/23/2014  . Essential hypertension 04/01/2008  . Aortic insufficiency 04/01/2008  . SKIN CANCER, HX OF 04/01/2008  . Hyperlipidemia 06/26/2006  . BENIGN PROSTATIC HYPERTROPHY 06/26/2006    Current Outpatient Medications on File Prior to Visit  Medication Sig Dispense Refill  . ADVAIR DISKUS 100-50 MCG/DOSE AEPB INHALE 1 PUFF BY MOUTH TWICE A DAY 60 each 1  . amLODipine (NORVASC) 2.5 MG tablet Take 1 tablet (2.5 mg total) by mouth daily. 90 tablet 3  . Cholecalciferol (VITAMIN D) 2000 UNITS tablet Take 2,000 Units by mouth daily.    . hydrochlorothiazide (HYDRODIURIL) 12.5 MG tablet Take 1 tablet (12.5 mg total) by mouth daily. 90 tablet 3  . losartan (COZAAR) 100 MG tablet TAKE 1 TABLET DAILY 90 tablet 1  . Multiple Vitamin (MULTIVITAMIN WITH MINERALS) TABS tablet Take 1 tablet by mouth daily.    . propranolol (INDERAL) 40 MG tablet Take 0.5 tablets (20  mg total) by mouth 2 (two) times daily. 180 tablet 3   No current facility-administered medications on file prior to visit.     Past Medical History:  Diagnosis Date  . Arthritis   . Basal cell cancer    SKIN.Marland KitchenFACE AND HAND  . Benign prostatic hypertrophy   . Bronchitis    11/2013   . Chest pain 1992   NEGATIVE CATH  . Hyperglycemia    PMH of  . Hyperlipidemia   . Hypertension   . Pneumonia 2009   treated as OP  . Tachycardia    hx of per patient     Past Surgical History:  Procedure Laterality Date  . BASAL CELL CARCINOMA EXCISION     X 3  . cataract surgery      bilateral  . COLONOSCOPY  2003   [facility]  . HERNIA REPAIR  1972   Dr  Marylene Buerger  . INGUINAL HERNIA REPAIR Right 04/06/2014   Procedure: REPAIR RIGHT INGUINAL HERNIA WITH MESH;  Surgeon: Jackolyn Confer, MD;  Location: [facility];  Service: General;  Laterality: Right;  . INSERTION OF MESH Right 04/06/2014   Procedure: INSERTION OF MESH;  Surgeon: Jackolyn Confer, MD;  Location: [facility];  Service: General;  Laterality: Right;    Social History   Socioeconomic History  . Marital status: Married    Spouse name: Not on file  . Number of children: 2  . Years of education: 108  .  Highest education level: Not on file  Occupational History  . Occupation: [employer]  Social Needs  . Financial resource strain: Not on file  . Food insecurity:    Worry: Not on file    Inability: Not on file  . Transportation needs:    Medical: Not on file    Non-medical: Not on file  Tobacco Use  . Smoking status: Never Smoker  . Smokeless tobacco: Never Used  Substance and Sexual Activity  . Alcohol use: No  . Drug use: No  . Sexual activity: Not Currently  Lifestyle  . Physical activity:    Days per week: Not on file    Minutes per session: Not on file  . Stress: Not on file  Relationships  . Social connections:    Talks on phone: Not on file    Gets together: Not on file    Attends religious service: Not on file      Active member of club or organization: Not on file    Attends meetings of clubs or organizations: Not on file    Relationship status: Not on file  Other Topics Concern  . Not on file  Social History Narrative   Denies abuse and feels safe at home.    Family History  Problem Relation Age of Onset  . Alcohol abuse Brother   . Hypertension Mother   . Heart disease Mother        AF  . Transient ischemic attack Mother   . Heart attack Father 17  . Hypertension Father   . Lung disease Father        ? [facility] visitor)  . Cancer Neg Hx   . Diabetes Neg Hx     Review of Systems  Constitutional: Negative for chills and fever.  Respiratory: Negative for cough, shortness of breath and wheezing.   Cardiovascular: Negative for chest pain, palpitations and leg swelling.  Neurological: Negative for light-headedness and headaches.       Objective:   Vitals:   01/24/18 0853  BP: (!) 148/88  Pulse: 65  Temp: (!) 97.5 F (36.4 C)  SpO2: 98%   BP Readings from Last 3 Encounters:  01/24/18 (!) 148/88  07/26/17 (!) 148/78  03/13/17 140/86   Wt Readings from Last 3 Encounters:  01/24/18 166 lb (75.3 kg)  07/26/17 171 lb (77.6 kg)  03/13/17 170 lb (77.1 kg)   Body mass index is 26 kg/m.   Physical Exam    Constitutional: Appears well-developed and well-nourished. No distress.  HENT:  Head: Normocephalic and atraumatic.  Neck: Neck supple. No tracheal deviation present. No thyromegaly present.  No cervical lymphadenopathy Cardiovascular: Normal rate, regular rhythm and normal heart sounds.   No murmur heard. No carotid bruit .  No edema Pulmonary/Chest: Effort normal and breath sounds normal. No respiratory distress. No has no wheezes. No rales.  Skin: Skin is warm and dry. Not diaphoretic.  Psychiatric: Normal mood and affect. Behavior is normal.      Assessment & Plan:    See Problem List for Assessment and Plan of chronic medical problems.

## 2018-01-23 NOTE — Patient Instructions (Addendum)
  Tests ordered today. Your results will be released to MyChart (or called to you) after review, usually within 72hours after test completion. If any changes need to be made, you will be notified at that same time.  Medications reviewed and updated.  Changes include :   none      Please followup in 6 months   

## 2018-01-24 ENCOUNTER — Encounter: Payer: Self-pay | Admitting: Internal Medicine

## 2018-01-24 ENCOUNTER — Other Ambulatory Visit (INDEPENDENT_AMBULATORY_CARE_PROVIDER_SITE_OTHER): Payer: Medicare Other

## 2018-01-24 ENCOUNTER — Ambulatory Visit (INDEPENDENT_AMBULATORY_CARE_PROVIDER_SITE_OTHER): Payer: Medicare Other | Admitting: Internal Medicine

## 2018-01-24 VITALS — BP 148/88 | HR 65 | Temp 97.5°F | Ht 67.0 in | Wt 166.0 lb

## 2018-01-24 DIAGNOSIS — I10 Essential (primary) hypertension: Secondary | ICD-10-CM | POA: Diagnosis not present

## 2018-01-24 DIAGNOSIS — E7849 Other hyperlipidemia: Secondary | ICD-10-CM | POA: Diagnosis not present

## 2018-01-24 DIAGNOSIS — R7303 Prediabetes: Secondary | ICD-10-CM | POA: Diagnosis not present

## 2018-01-24 LAB — COMPREHENSIVE METABOLIC PANEL
ALT: 17 U/L (ref 0–53)
AST: 20 U/L (ref 0–37)
Albumin: 4.3 g/dL (ref 3.5–5.2)
Alkaline Phosphatase: 60 U/L (ref 39–117)
BILIRUBIN TOTAL: 0.9 mg/dL (ref 0.2–1.2)
BUN: 17 mg/dL (ref 6–23)
CALCIUM: 10.2 mg/dL (ref 8.4–10.5)
CHLORIDE: 104 meq/L (ref 96–112)
CO2: 29 meq/L (ref 19–32)
Creatinine, Ser: 1.08 mg/dL (ref 0.40–1.50)
GFR: 68.55 mL/min (ref >60.00)
GLUCOSE: 119 mg/dL — AB (ref 70–99)
Potassium: 5.1 mEq/L (ref 3.5–5.1)
Sodium: 139 mEq/L (ref 135–145)
Total Protein: 7.3 g/dL (ref 6.0–8.3)

## 2018-01-24 LAB — LIPID PANEL
Cholesterol: 181 mg/dL (ref 0–200)
HDL: 35.3 mg/dL — ABNORMAL LOW (ref >39.00)
LDL Cholesterol: 122 mg/dL — ABNORMAL HIGH (ref 0–99)
NonHDL: 145.36
Total CHOL/HDL Ratio: 5
Triglycerides: 115 mg/dL (ref 0.0–149.0)
VLDL: 23 mg/dL (ref 0.0–40.0)

## 2018-01-24 LAB — HEMOGLOBIN A1C: Hgb A1c MFr Bld: 5.6 % (ref 4.6–6.5)

## 2018-01-24 NOTE — Assessment & Plan Note (Signed)
Check a1c Low sugar / carb diet Stressed regular exercise   

## 2018-01-24 NOTE — Assessment & Plan Note (Addendum)
BP slightly elevated but acceptable -  consistent Current regimen effective and well tolerated Continue current medications at current doses cmp

## 2018-01-24 NOTE — Assessment & Plan Note (Addendum)
Check lipid panel  Not on any medication Regular exercise and healthy diet encouraged

## 2018-01-25 ENCOUNTER — Encounter: Payer: Self-pay | Admitting: Internal Medicine

## 2018-01-30 ENCOUNTER — Other Ambulatory Visit: Payer: Self-pay | Admitting: Internal Medicine

## 2018-02-21 ENCOUNTER — Other Ambulatory Visit: Payer: Self-pay | Admitting: Internal Medicine

## 2018-02-21 MED ORDER — AMLODIPINE BESYLATE 2.5 MG PO TABS: 2.5000 mg | ORAL_TABLET | Freq: Every day | ORAL | 3 refills | Status: DC

## 2018-02-21 NOTE — Telephone Encounter (Signed)
Copied from [facility] [id number]. Topic: Quick Communication - Rx Refill/Question >> Feb 21, 2018  2:06 PM Rayann Heman wrote: Medication: amLODipine (NORVASC) 2.5 MG tablet [[id number]]   Has the patient contacted their pharmacy?  Preferred Pharmacy (with phone number or street name): [id number] - [place], [place] [place] [phone] (Phone) [phone] (Fax)   Agent: Please be advised that RX refills may take up to 3 business days. We ask that you follow-up with your pharmacy.

## 2018-02-22 ENCOUNTER — Other Ambulatory Visit: Payer: Self-pay | Admitting: Internal Medicine

## 2018-03-17 DIAGNOSIS — M25512 Pain in left shoulder: Secondary | ICD-10-CM | POA: Diagnosis not present

## 2018-04-04 ENCOUNTER — Other Ambulatory Visit: Payer: Self-pay | Admitting: Internal Medicine

## 2018-07-29 NOTE — Progress Notes (Signed)
Subjective:    Patient ID: Christopher Richards, male    DOB: 1927-03-14, 85 y.o.   MRN: [id number]  HPI The patient is here for follow up.  He is exercising regularly.   He has lost weight.    Hypertension: He is taking his medication daily. He is compliant with a low sodium diet.  He denies chest pain, palpitations, edema, shortness of breath and regular headaches.  He does monitor his blood pressure at home - 130/70 this morning.  The highest it has been in the past month was 134/?.    Prediabetes:  He is compliant with a low sugar/carbohydrate diet.  He is exercising regularly.  Hyperlipidemia: He is controlling his cholesterol with diet. He is compliant with a low fat/cholesterol diet.      Medications and allergies reviewed with patient and updated if appropriate.  Patient Active Problem List   Diagnosis Date Noted  . Allergic rhinitis 07/25/2016  . Chronic sinusitis 01/20/2016  . Prediabetes 07/21/2015  . Inguinal hernia 03/23/2014  . Essential hypertension 04/01/2008  . Aortic insufficiency 04/01/2008  . SKIN CANCER, HX OF 04/01/2008  . Hyperlipidemia 06/26/2006  . BENIGN PROSTATIC HYPERTROPHY 06/26/2006    Current Outpatient Medications on File Prior to Visit  Medication Sig Dispense Refill  . ADVAIR DISKUS 100-50 MCG/DOSE AEPB INHALE 1 PUFF BY MOUTH TWICE A DAY 60 each 1  . amLODipine (NORVASC) 2.5 MG tablet Take 1 tablet (2.5 mg total) by mouth daily. 90 tablet 3  . Cholecalciferol (VITAMIN D) 2000 UNITS tablet Take 2,000 Units by mouth daily.    . hydrochlorothiazide (HYDRODIURIL) 12.5 MG tablet TAKE 1 TABLET EVERY DAY 90 tablet 1  . Multiple Vitamin (MULTIVITAMIN WITH MINERALS) TABS tablet Take 1 tablet by mouth daily.    . propranolol (INDERAL) 40 MG tablet TAKE 1/2 TABLET TWICE DAILY 90 tablet 2   No current facility-administered medications on file prior to visit.     Past Medical History:  Diagnosis Date  . Arthritis   . Basal cell cancer    SKIN.Marland KitchenFACE AND  HAND  . Benign prostatic hypertrophy   . Bronchitis    11/2013   . Chest pain 1992   NEGATIVE CATH  . Hyperglycemia    PMH of  . Hyperlipidemia   . Hypertension   . Pneumonia 2009   treated as OP  . Tachycardia    hx of per patient     Past Surgical History:  Procedure Laterality Date  . BASAL CELL CARCINOMA EXCISION     X 3  . cataract surgery      bilateral  . COLONOSCOPY  2003   [facility]  . HERNIA REPAIR  1972   Dr  Marylene Buerger  . INGUINAL HERNIA REPAIR Right 04/06/2014   Procedure: REPAIR RIGHT INGUINAL HERNIA WITH MESH;  Surgeon: Jackolyn Confer, MD;  Location: [facility];  Service: General;  Laterality: Right;  . INSERTION OF MESH Right 04/06/2014   Procedure: INSERTION OF MESH;  Surgeon: Jackolyn Confer, MD;  Location: [facility];  Service: General;  Laterality: Right;    Social History   Socioeconomic History  . Marital status: Married    Spouse name: Not on file  . Number of children: 2  . Years of education: 82  . Highest education level: Not on file  Occupational History  . Occupation: [employer]  Social Needs  . Financial resource strain: Not on file  . Food insecurity:    Worry: Not on file  Inability: Not on file  . Transportation needs:    Medical: Not on file    Non-medical: Not on file  Tobacco Use  . Smoking status: Never Smoker  . Smokeless tobacco: Never Used  Substance and Sexual Activity  . Alcohol use: No  . Drug use: No  . Sexual activity: Not Currently  Lifestyle  . Physical activity:    Days per week: Not on file    Minutes per session: Not on file  . Stress: Not on file  Relationships  . Social connections:    Talks on phone: Not on file    Gets together: Not on file    Attends religious service: Not on file    Active member of club or organization: Not on file    Attends meetings of clubs or organizations: Not on file    Relationship status: Not on file  Other Topics Concern  . Not on file  Social History Narrative   Denies  abuse and feels safe at home.    Family History  Problem Relation Age of Onset  . Alcohol abuse Brother   . Hypertension Mother   . Heart disease Mother        AF  . Transient ischemic attack Mother   . Heart attack Father 32  . Hypertension Father   . Lung disease Father        ? [facility] visitor)  . Cancer Neg Hx   . Diabetes Neg Hx     Review of Systems  Constitutional: Negative for chills and fever.  Respiratory: Negative for cough, shortness of breath and wheezing.   Cardiovascular: Negative for chest pain, palpitations and leg swelling.  Neurological: Negative for light-headedness and headaches.       Objective:   Vitals:   07/30/18 1006 07/30/18 1037  BP: (!) 185/85 (!) 144/84  Pulse:    Resp:    Temp:    SpO2:     BP Readings from Last 3 Encounters:  07/30/18 (!) 144/84  01/24/18 (!) 148/88  07/26/17 (!) 148/78   Wt Readings from Last 3 Encounters:  07/30/18 161 lb (73 kg)  01/24/18 166 lb (75.3 kg)  07/26/17 171 lb (77.6 kg)   Body mass index is 25.22 kg/m.   Physical Exam    Constitutional: Appears well-developed and well-nourished. No distress.  HENT:  Head: Normocephalic and atraumatic.  Neck: Neck supple. No tracheal deviation present. No thyromegaly present.  No cervical lymphadenopathy Cardiovascular: Normal rate, regular rhythm and normal heart sounds.   No murmur heard. No carotid bruit .  No edema Pulmonary/Chest: Effort normal and breath sounds normal. No respiratory distress. No has no wheezes. No rales.  Skin: Skin is warm and dry. Not diaphoretic.  Psychiatric: Normal mood and affect. Behavior is normal.      Assessment & Plan:    See Problem List for Assessment and Plan of chronic medical problems.

## 2018-07-30 ENCOUNTER — Encounter: Payer: Self-pay | Admitting: Internal Medicine

## 2018-07-30 ENCOUNTER — Other Ambulatory Visit: Payer: Self-pay

## 2018-07-30 ENCOUNTER — Other Ambulatory Visit (INDEPENDENT_AMBULATORY_CARE_PROVIDER_SITE_OTHER): Payer: Medicare Other

## 2018-07-30 ENCOUNTER — Ambulatory Visit (INDEPENDENT_AMBULATORY_CARE_PROVIDER_SITE_OTHER): Payer: Medicare Other | Admitting: Internal Medicine

## 2018-07-30 VITALS — BP 144/84 | HR 64 | Temp 98.5°F | Resp 16 | Ht 67.0 in | Wt 161.0 lb

## 2018-07-30 DIAGNOSIS — R7303 Prediabetes: Secondary | ICD-10-CM

## 2018-07-30 DIAGNOSIS — I10 Essential (primary) hypertension: Secondary | ICD-10-CM | POA: Diagnosis not present

## 2018-07-30 DIAGNOSIS — E7849 Other hyperlipidemia: Secondary | ICD-10-CM | POA: Diagnosis not present

## 2018-07-30 LAB — CBC WITH DIFFERENTIAL/PLATELET
Basophils Absolute: 0 10*3/uL (ref 0.0–0.1)
Basophils Relative: 0.6 % (ref 0.0–3.0)
Eosinophils Absolute: 0.6 10*3/uL (ref 0.0–0.7)
Eosinophils Relative: 7.9 % — ABNORMAL HIGH (ref 0.0–5.0)
HCT: 44 % (ref 39.0–52.0)
Hemoglobin: 15.1 g/dL (ref 13.0–17.0)
Lymphocytes Relative: 22.7 % (ref 12.0–46.0)
Lymphs Abs: 1.8 10*3/uL (ref 0.7–4.0)
MCHC: 34.4 g/dL (ref 30.0–36.0)
MCV: 90 fl (ref 78.0–100.0)
Monocytes Absolute: 0.6 10*3/uL (ref 0.1–1.0)
Monocytes Relative: 7.8 % (ref 3.0–12.0)
Neutro Abs: 4.7 10*3/uL (ref 1.4–7.7)
Neutrophils Relative %: 61 % (ref 43.0–77.0)
Platelets: 259 10*3/uL (ref 150.0–400.0)
RBC: 4.89 Mil/uL (ref 4.22–5.81)
RDW: 13.5 % (ref 11.5–15.5)
WBC: 7.8 10*3/uL (ref 4.0–10.5)

## 2018-07-30 LAB — COMPREHENSIVE METABOLIC PANEL
ALT: 12 U/L (ref 0–53)
AST: 18 U/L (ref 0–37)
Albumin: 4.2 g/dL (ref 3.5–5.2)
Alkaline Phosphatase: 60 U/L (ref 39–117)
BUN: 17 mg/dL (ref 6–23)
CO2: 27 mEq/L (ref 19–32)
Calcium: 9.6 mg/dL (ref 8.4–10.5)
Chloride: 105 mEq/L (ref 96–112)
Creatinine, Ser: 1.08 mg/dL (ref 0.40–1.50)
GFR: 64.42 mL/min (ref >60.00)
Glucose, Bld: 109 mg/dL — ABNORMAL HIGH (ref 70–99)
Potassium: 4.1 mEq/L (ref 3.5–5.1)
Sodium: 139 mEq/L (ref 135–145)
Total Bilirubin: 0.8 mg/dL (ref 0.2–1.2)
Total Protein: 6.9 g/dL (ref 6.0–8.3)

## 2018-07-30 LAB — LIPID PANEL
Cholesterol: 164 mg/dL (ref 0–200)
HDL: 40.3 mg/dL (ref >39.00)
LDL Cholesterol: 108 mg/dL — ABNORMAL HIGH (ref 0–99)
NonHDL: 123.73
Total CHOL/HDL Ratio: 4
Triglycerides: 79 mg/dL (ref 0.0–149.0)
VLDL: 15.8 mg/dL (ref 0.0–40.0)

## 2018-07-30 LAB — TSH: TSH: 1.03 u[IU]/mL (ref 0.35–4.50)

## 2018-07-30 LAB — HEMOGLOBIN A1C: Hgb A1c MFr Bld: 5.7 % (ref 4.6–6.5)

## 2018-07-30 MED ORDER — LOSARTAN POTASSIUM 100 MG PO TABS: 100.0000 mg | ORAL_TABLET | Freq: Every day | ORAL | 1 refills | Status: DC

## 2018-07-30 NOTE — Patient Instructions (Signed)
  Tests ordered today. Your results will be released to [facility] (or called to you) after review, usually within 72hours after test completion. If any changes need to be made, you will be notified at that same time.    Medications reviewed and updated.  Changes include :   none  Your prescription(s) have been submitted to your pharmacy. Please take as directed and contact our office if you believe you are having problem(s) with the medication(s).   Please followup in 6 months with me.   Nurse visit in the next 1-2 days to recheck your BP -- bring your home cuff with you

## 2018-07-30 NOTE — Assessment & Plan Note (Addendum)
BP very high here today - he is asymptomatic and this is very unusual for him  BP well controlled at home and typically not this elevated Recheck bp better F/u for nurse visit in 1-2 days - bring cuff with him  Monitor BP at home If elevated at nurse visit will increase amlodipine to 5 mg daily cmp

## 2018-07-30 NOTE — Assessment & Plan Note (Signed)
Check a1c Low sugar / carb diet Stressed regular exercise   

## 2018-07-30 NOTE — Assessment & Plan Note (Signed)
Check lipid panel  Diet controlled Regular exercise and healthy diet encouraged  

## 2018-08-01 ENCOUNTER — Encounter: Payer: Self-pay | Admitting: Internal Medicine

## 2018-08-01 ENCOUNTER — Ambulatory Visit: Payer: Medicare Other

## 2018-08-01 VITALS — BP 182/90

## 2018-08-01 DIAGNOSIS — I10 Essential (primary) hypertension: Secondary | ICD-10-CM

## 2018-08-01 NOTE — Progress Notes (Signed)
Patient came in today (nurse visit) to compare bp readings with his bp machine from home compared to manual bp check here in office----his home monitor is reading same as manual bp cuff with me---however, when he is at home, his readings are trending in 322'G sytolic and 25'K diastolic (patient brought list of bp readings from several different days/time of days)---patient states this has just started happening recently and it could be that he gets very anxious driving in traffic---but he doesn't feel like he's anxious about the doctors visit, but it could be that driving around town is becoming too stressful---patient advised to always call us if readings get high and he's NOT driving so that we can address bp at that time---routing to dr burns, patient is currently on 2 bp medications and looks to have bp well contolled at home---do you want to make any adjustments to med/doses of meds----please advise, I will call patient back, thanks

## 2018-08-01 NOTE — Progress Notes (Signed)
No adjustment to medication needed.

## 2018-09-28 ENCOUNTER — Other Ambulatory Visit: Payer: Self-pay | Admitting: Internal Medicine

## 2018-10-24 ENCOUNTER — Other Ambulatory Visit: Payer: Self-pay | Admitting: Internal Medicine

## 2019-01-28 NOTE — Patient Instructions (Addendum)
  Tests ordered today. Your results will be released to MyChart (or called to you) after review.  If any changes need to be made, you will be notified at that same time.    Medications reviewed and updated.  Changes include :   none     Please followup in 6 months   

## 2019-01-28 NOTE — Progress Notes (Signed)
Subjective:    Patient ID: Christopher Richards, male    DOB: 02/14/1928, 85 y.o.   MRN: [id number]  HPI The patient is here for follow up.  He is exercising regularly - walking.    Hypertension: He is taking his medication daily. He is compliant with a low sodium diet.  He denies chest pain, palpitations, edema, shortness of breath and regular headaches.  He does not monitor his blood pressure at home.    Prediabetes:  He is compliant with a low sugar/carbohydrate diet.  He is exercising regularly.  Hyperlipidemia: He is taking his medication daily. He is compliant with a low fat/cholesterol diet. He denies myalgias.   COPD:  He uses the Advair twice a day.  He has a mild chronic cough and occasionally brings up sputum - about twice a day.  He denies wheeze.  He had mild SOB with acitivity.   Medications and allergies reviewed with patient and updated if appropriate.  Patient Active Problem List   Diagnosis Date Noted  . Allergic rhinitis 07/25/2016  . Chronic sinusitis 01/20/2016  . Prediabetes 07/21/2015  . Inguinal hernia 03/23/2014  . Essential hypertension 04/01/2008  . Aortic insufficiency 04/01/2008  . SKIN CANCER, HX OF 04/01/2008  . Hyperlipidemia 06/26/2006  . BENIGN PROSTATIC HYPERTROPHY 06/26/2006    Current Outpatient Medications on File Prior to Visit  Medication Sig Dispense Refill  . ADVAIR DISKUS 100-50 MCG/DOSE AEPB INHALE 1 PUFF BY MOUTH TWICE A DAY 60 each 1  . amLODipine (NORVASC) 2.5 MG tablet Take 1 tablet (2.5 mg total) by mouth daily. 90 tablet 3  . Cholecalciferol (VITAMIN D) 2000 UNITS tablet Take 2,000 Units by mouth daily.    . hydrochlorothiazide (HYDRODIURIL) 12.5 MG tablet TAKE 1 TABLET BY MOUTH EVERY DAY 90 tablet 0  . losartan (COZAAR) 100 MG tablet Take 1 tablet (100 mg total) by mouth daily. 90 tablet 1  . Multiple Vitamin (MULTIVITAMIN WITH MINERALS) TABS tablet Take 1 tablet by mouth daily.    . propranolol (INDERAL) 40 MG tablet TAKE 1/2  TABLET BY MOUTH TWICE DAILY 90 tablet 2   No current facility-administered medications on file prior to visit.    Past Medical History:  Diagnosis Date  . Arthritis   . Basal cell cancer    SKIN.Marland KitchenFACE AND HAND  . Benign prostatic hypertrophy   . Bronchitis    11/2013   . Chest pain 1992   NEGATIVE CATH  . Hyperglycemia    PMH of  . Hyperlipidemia   . Hypertension   . Pneumonia 2009   treated as OP  . Tachycardia    hx of per patient     Past Surgical History:  Procedure Laterality Date  . BASAL CELL CARCINOMA EXCISION     X 3  . cataract surgery      bilateral  . COLONOSCOPY  2003   [facility]  . HERNIA REPAIR  1972   Dr  Marylene Buerger  . INGUINAL HERNIA REPAIR Right 04/06/2014   Procedure: REPAIR RIGHT INGUINAL HERNIA WITH MESH;  Surgeon: Jackolyn Confer, MD;  Location: [facility];  Service: General;  Laterality: Right;  . INSERTION OF MESH Right 04/06/2014   Procedure: INSERTION OF MESH;  Surgeon: Jackolyn Confer, MD;  Location: [facility];  Service: General;  Laterality: Right;    Social History   Socioeconomic History  . Marital status: Married    Spouse name: Not on file  . Number of children: 2  .  Years of education: 53  . Highest education level: Not on file  Occupational History  . Occupation: [employer]  Tobacco Use  . Smoking status: Never Smoker  . Smokeless tobacco: Never Used  Substance and Sexual Activity  . Alcohol use: No  . Drug use: No  . Sexual activity: Not Currently  Other Topics Concern  . Not on file  Social History Narrative   Denies abuse and feels safe at home.   Social Determinants of Health   Financial Resource Strain:   . Difficulty of Paying Living Expenses: Not on file  Food Insecurity:   . Worried About Charity fundraiser in the Last Year: Not on file  . Ran Out of Food in the Last Year: Not on file  Transportation Needs:   . Lack of Transportation (Medical): Not on file  . Lack of Transportation (Non-Medical): Not on file   Physical Activity:   . Days of Exercise per Week: Not on file  . Minutes of Exercise per Session: Not on file  Stress:   . Feeling of Stress : Not on file  Social Connections:   . Frequency of Communication with Friends and Family: Not on file  . Frequency of Social Gatherings with Friends and Family: Not on file  . Attends Religious Services: Not on file  . Active Member of Clubs or Organizations: Not on file  . Attends Archivist Meetings: Not on file  . Marital Status: Not on file    Family History  Problem Relation Age of Onset  . Alcohol abuse Brother   . Hypertension Mother   . Heart disease Mother        AF  . Transient ischemic attack Mother   . Heart attack Father 49  . Hypertension Father   . Lung disease Father        ? [facility] visitor)  . Cancer Neg Hx   . Diabetes Neg Hx     Review of Systems  Constitutional: Negative for chills and fever.  Respiratory: Positive for cough (mild - COPD - chronic) and shortness of breath (mild). Negative for wheezing.   Cardiovascular: Negative for chest pain, palpitations and leg swelling.  Neurological: Negative for light-headedness and headaches.       Objective:   Vitals:   01/29/19 0904  BP: 126/78  Pulse: 64  Temp: (!) 97.5 F (36.4 C)  SpO2: 98%   BP Readings from Last 3 Encounters:  01/29/19 126/78  08/01/18 (!) 182/90  07/30/18 (!) 144/84   Wt Readings from Last 3 Encounters:  01/29/19 161 lb 12.8 oz (73.4 kg)  07/30/18 161 lb (73 kg)  01/24/18 166 lb (75.3 kg)   Body mass index is 25.34 kg/m.   Physical Exam    Constitutional: Appears well-developed and well-nourished. No distress.  HENT:  Head: Normocephalic and atraumatic.  Neck: Neck supple. No tracheal deviation present. No thyromegaly present.  No cervical lymphadenopathy Cardiovascular: Normal rate, regular rhythm and normal heart sounds.   1/6 diastolic murmur heard. No carotid bruit .  No edema Pulmonary/Chest:  Effort normal and breath sounds normal. No respiratory distress. No has no wheezes. No rales.  Skin: Skin is warm and dry. Not diaphoretic.  Psychiatric: Normal mood and affect. Behavior is normal.      Assessment & Plan:    See Problem List for Assessment and Plan of chronic medical problems.    This visit occurred during the SARS-CoV-2 public health emergency.  Safety  protocols were in place, including screening questions prior to the visit, additional usage of staff PPE, and extensive cleaning of exam room while observing appropriate contact time as indicated for disinfecting solutions.

## 2019-01-29 ENCOUNTER — Encounter: Payer: Self-pay | Admitting: Internal Medicine

## 2019-01-29 ENCOUNTER — Ambulatory Visit (INDEPENDENT_AMBULATORY_CARE_PROVIDER_SITE_OTHER): Payer: Medicare Other | Admitting: Internal Medicine

## 2019-01-29 ENCOUNTER — Other Ambulatory Visit: Payer: Self-pay

## 2019-01-29 ENCOUNTER — Other Ambulatory Visit (INDEPENDENT_AMBULATORY_CARE_PROVIDER_SITE_OTHER): Payer: Medicare Other

## 2019-01-29 VITALS — BP 126/78 | HR 64 | Temp 97.5°F | Ht 67.0 in | Wt 161.8 lb

## 2019-01-29 DIAGNOSIS — E7849 Other hyperlipidemia: Secondary | ICD-10-CM

## 2019-01-29 DIAGNOSIS — R7303 Prediabetes: Secondary | ICD-10-CM | POA: Diagnosis not present

## 2019-01-29 DIAGNOSIS — I10 Essential (primary) hypertension: Secondary | ICD-10-CM

## 2019-01-29 LAB — COMPREHENSIVE METABOLIC PANEL
ALT: 16 U/L (ref 0–53)
AST: 19 U/L (ref 0–37)
Albumin: 4.3 g/dL (ref 3.5–5.2)
Alkaline Phosphatase: 67 U/L (ref 39–117)
BUN: 20 mg/dL (ref 6–23)
CO2: 29 mEq/L (ref 19–32)
Calcium: 9.9 mg/dL (ref 8.4–10.5)
Chloride: 104 mEq/L (ref 96–112)
Creatinine, Ser: 1.04 mg/dL (ref 0.40–1.50)
GFR: 67.21 mL/min (ref >60.00)
Glucose, Bld: 108 mg/dL — ABNORMAL HIGH (ref 70–99)
Potassium: 4.5 mEq/L (ref 3.5–5.1)
Sodium: 140 mEq/L (ref 135–145)
Total Bilirubin: 0.7 mg/dL (ref 0.2–1.2)
Total Protein: 7.3 g/dL (ref 6.0–8.3)

## 2019-01-29 LAB — LIPID PANEL
Cholesterol: 180 mg/dL (ref 0–200)
HDL: 44.5 mg/dL (ref >39.00)
LDL Cholesterol: 118 mg/dL — ABNORMAL HIGH (ref 0–99)
NonHDL: 135.97
Total CHOL/HDL Ratio: 4
Triglycerides: 90 mg/dL (ref 0.0–149.0)
VLDL: 18 mg/dL (ref 0.0–40.0)

## 2019-01-29 LAB — HEMOGLOBIN A1C: Hgb A1c MFr Bld: 5.5 % (ref 4.6–6.5)

## 2019-01-29 NOTE — Assessment & Plan Note (Signed)
Check a1c Low sugar / carb diet Stressed regular exercise   

## 2019-01-29 NOTE — Assessment & Plan Note (Signed)
BP well controlled Current regimen effective and well tolerated Continue current medications at current doses cmp  

## 2019-01-29 NOTE — Assessment & Plan Note (Signed)
Check lipid panel  Diet controlled Regular exercise and healthy diet encouraged  

## 2019-01-30 ENCOUNTER — Other Ambulatory Visit: Payer: Self-pay | Admitting: Internal Medicine

## 2019-03-10 ENCOUNTER — Ambulatory Visit: Payer: Medicare Other

## 2019-03-10 ENCOUNTER — Ambulatory Visit: Payer: Medicare Other | Attending: Internal Medicine

## 2019-03-10 DIAGNOSIS — Z23 Encounter for immunization: Secondary | ICD-10-CM | POA: Diagnosis not present

## 2019-03-10 NOTE — Progress Notes (Signed)
   Covid-19 Vaccination Clinic  Name:  [name]    MRN: [id number] DOB: 1927-03-25  03/10/2019  [name] was observed post Covid-19 immunization for 15 minutes without incidence. He was provided with Vaccine Information Sheet and instruction to access the V-Safe system.   [name] was instructed to call 911 with any severe reactions post vaccine: Marland Kitchen Difficulty breathing  . Swelling of your face and throat  . A fast heartbeat  . A bad rash all over your body  . Dizziness and weakness    Immunizations Administered    Name Date Dose VIS Date Route   Pfizer COVID-19 Vaccine 03/10/2019 12:41 PM 0.3 mL 01/30/2019 Intramuscular   Manufacturer: [place]   Lot: [id number]   [place]: [id number]

## 2019-03-29 ENCOUNTER — Other Ambulatory Visit: Payer: Self-pay | Admitting: Internal Medicine

## 2019-03-30 ENCOUNTER — Ambulatory Visit: Payer: Medicare Other | Attending: Internal Medicine

## 2019-03-30 DIAGNOSIS — Z23 Encounter for immunization: Secondary | ICD-10-CM

## 2019-03-30 NOTE — Progress Notes (Signed)
   Covid-19 Vaccination Clinic  Name:  Christopher Richards    MRN: [id number] DOB: 05-05-1927  03/30/2019  [name] was observed post Covid-19 immunization for 15 minutes without incidence. He was provided with Vaccine Information Sheet and instruction to access the V-Safe system.   [name] was instructed to call 911 with any severe reactions post vaccine: Marland Kitchen Difficulty breathing  . Swelling of your face and throat  . A fast heartbeat  . A bad rash all over your body  . Dizziness and weakness    Immunizations Administered    Name Date Dose VIS Date Route   Pfizer COVID-19 Vaccine 03/30/2019  1:38 PM 0.3 mL 01/30/2019 Intramuscular   Manufacturer: [place]   Lot: [id number]   [place]: [id number]

## 2019-05-07 ENCOUNTER — Other Ambulatory Visit: Payer: Self-pay | Admitting: Internal Medicine

## 2019-07-29 DIAGNOSIS — K449 Diaphragmatic hernia without obstruction or gangrene: Secondary | ICD-10-CM | POA: Insufficient documentation

## 2019-07-29 NOTE — Patient Instructions (Addendum)
  Blood work was ordered.     Medications reviewed and updated.  Changes include :   none  Your prescription(s) have been submitted to your pharmacy. Please take as directed and contact our office if you believe you are having problem(s) with the medication(s).   Please followup in 6 months   

## 2019-07-29 NOTE — Progress Notes (Signed)
Subjective:    Patient ID: Christopher Richards, male    DOB: 22-Mar-1927, 86 y.o.   MRN: [id number]  HPI The patient is here for follow up of their chronic medical problems, including htn, prediabetes, hyperlipidemia, COPD with chronic sob and cough.  He is taking all of his medications as prescribed.   He is exercising regularly.   He walks.  He is eating healthy   BP at home - 127/74-140/74    65-71  Medications and allergies reviewed with patient and updated if appropriate.  Patient Active Problem List   Diagnosis Date Noted  . Hiatal hernia 07/29/2019  . Allergic rhinitis 07/25/2016  . Chronic sinusitis 01/20/2016  . Prediabetes 07/21/2015  . Inguinal hernia 03/23/2014  . Essential hypertension 04/01/2008  . Aortic insufficiency 04/01/2008  . SKIN CANCER, HX OF 04/01/2008  . Hyperlipidemia 06/26/2006  . BENIGN PROSTATIC HYPERTROPHY 06/26/2006    Current Outpatient Medications on File Prior to Visit  Medication Sig Dispense Refill  . ADVAIR DISKUS 100-50 MCG/DOSE AEPB INHALE 1 PUFF BY MOUTH TWICE A DAY 60 each 1  . amLODipine (NORVASC) 2.5 MG tablet TAKE 1 TABLET BY MOUTH EVERY DAY 90 tablet 3  . Cholecalciferol (VITAMIN D) 2000 UNITS tablet Take 2,000 Units by mouth daily.    . hydrochlorothiazide (HYDRODIURIL) 12.5 MG tablet TAKE 1 TABLET BY MOUTH EVERY DAY 90 tablet 0  . losartan (COZAAR) 100 MG tablet TAKE 1 TABLET BY MOUTH EVERY DAY 90 tablet 1  . Multiple Vitamin (MULTIVITAMIN WITH MINERALS) TABS tablet Take 1 tablet by mouth daily.    . propranolol (INDERAL) 40 MG tablet Take 0.5 tablets (20 mg total) by mouth 2 (two) times daily. Annual appt due in June must see provider for future refills 90 tablet 0   No current facility-administered medications on file prior to visit.    Past Medical History:  Diagnosis Date  . Arthritis   . Basal cell cancer    SKIN.Marland KitchenFACE AND HAND  . Benign prostatic hypertrophy   . Bronchitis    11/2013   . Chest pain 1992   NEGATIVE  CATH  . Hyperglycemia    PMH of  . Hyperlipidemia   . Hypertension   . Pneumonia 2009   treated as OP  . Tachycardia    hx of per patient     Past Surgical History:  Procedure Laterality Date  . BASAL CELL CARCINOMA EXCISION     X 3  . cataract surgery      bilateral  . COLONOSCOPY  2003   [facility]  . HERNIA REPAIR  1972   Dr  Marylene Buerger  . INGUINAL HERNIA REPAIR Right 04/06/2014   Procedure: REPAIR RIGHT INGUINAL HERNIA WITH MESH;  Surgeon: Jackolyn Confer, MD;  Location: [facility];  Service: General;  Laterality: Right;  . INSERTION OF MESH Right 04/06/2014   Procedure: INSERTION OF MESH;  Surgeon: Jackolyn Confer, MD;  Location: [facility];  Service: General;  Laterality: Right;    Social History   Socioeconomic History  . Marital status: Married    Spouse name: Not on file  . Number of children: 2  . Years of education: 61  . Highest education level: Not on file  Occupational History  . Occupation: [employer]  Tobacco Use  . Smoking status: Never Smoker  . Smokeless tobacco: Never Used  Substance and Sexual Activity  . Alcohol use: No  . Drug use: No  . Sexual activity: Not Currently  Other  Topics Concern  . Not on file  Social History Narrative   Denies abuse and feels safe at home.   Social Determinants of Health   Financial Resource Strain:   . Difficulty of Paying Living Expenses:   Food Insecurity:   . Worried About Charity fundraiser in the Last Year:   . Arboriculturist in the Last Year:   Transportation Needs:   . Film/video editor (Medical):   Marland Kitchen Lack of Transportation (Non-Medical):   Physical Activity:   . Days of Exercise per Week:   . Minutes of Exercise per Session:   Stress:   . Feeling of Stress :   Social Connections:   . Frequency of Communication with Friends and Family:   . Frequency of Social Gatherings with Friends and Family:   . Attends Religious Services:   . Active Member of Clubs or Organizations:   . Attends Theatre manager Meetings:   Marland Kitchen Marital Status:     Family History  Problem Relation Age of Onset  . Alcohol abuse Brother   . Hypertension Mother   . Heart disease Mother        AF  . Transient ischemic attack Mother   . Heart attack Father 32  . Hypertension Father   . Lung disease Father        ? [facility] visitor)  . Cancer Neg Hx   . Diabetes Neg Hx     Review of Systems  Constitutional: Negative for chills and fever.  Respiratory: Negative for cough, shortness of breath and wheezing.   Cardiovascular: Negative for chest pain, palpitations and leg swelling.  Neurological: Negative for dizziness, light-headedness and headaches.       Objective:   Vitals:   07/30/19 0902  BP: 126/82  Pulse: 74  Temp: 98.5 F (36.9 C)  SpO2: 96%   BP Readings from Last 3 Encounters:  07/30/19 126/82  01/29/19 126/78  08/01/18 (!) 182/90   Wt Readings from Last 3 Encounters:  07/30/19 162 lb 3.2 oz (73.6 kg)  01/29/19 161 lb 12.8 oz (73.4 kg)  07/30/18 161 lb (73 kg)   Body mass index is 25.4 kg/m.   Physical Exam    Constitutional: Appears well-developed and well-nourished. No distress.  HENT:  Head: Normocephalic and atraumatic.  Neck: Neck supple. No tracheal deviation present. No thyromegaly present.  No cervical lymphadenopathy Cardiovascular: Normal rate, regular rhythm and normal heart sounds.   No murmur heard. No carotid bruit .  No edema Pulmonary/Chest: Effort normal and breath sounds normal. No respiratory distress. No has no wheezes. No rales.  Skin: Skin is warm and dry. Not diaphoretic.  Psychiatric: Normal mood and affect. Behavior is normal.      Assessment & Plan:    See Problem List for Assessment and Plan of chronic medical problems.    This visit occurred during the SARS-CoV-2 public health emergency.  Safety protocols were in place, including screening questions prior to the visit, additional usage of staff PPE, and extensive cleaning  of exam room while observing appropriate contact time as indicated for disinfecting solutions.

## 2019-07-30 ENCOUNTER — Other Ambulatory Visit: Payer: Self-pay

## 2019-07-30 ENCOUNTER — Ambulatory Visit (INDEPENDENT_AMBULATORY_CARE_PROVIDER_SITE_OTHER): Payer: Medicare Other | Admitting: Internal Medicine

## 2019-07-30 ENCOUNTER — Other Ambulatory Visit: Payer: Self-pay | Admitting: Internal Medicine

## 2019-07-30 ENCOUNTER — Encounter: Payer: Self-pay | Admitting: Internal Medicine

## 2019-07-30 VITALS — BP 126/82 | HR 74 | Temp 98.5°F | Ht 67.0 in | Wt 162.2 lb

## 2019-07-30 DIAGNOSIS — I10 Essential (primary) hypertension: Secondary | ICD-10-CM | POA: Diagnosis not present

## 2019-07-30 DIAGNOSIS — R7303 Prediabetes: Secondary | ICD-10-CM

## 2019-07-30 DIAGNOSIS — E7849 Other hyperlipidemia: Secondary | ICD-10-CM

## 2019-07-30 LAB — COMPREHENSIVE METABOLIC PANEL
ALT: 15 U/L (ref 0–53)
AST: 21 U/L (ref 0–37)
Albumin: 4.2 g/dL (ref 3.5–5.2)
Alkaline Phosphatase: 64 U/L (ref 39–117)
BUN: 22 mg/dL (ref 6–23)
CO2: 29 mEq/L (ref 19–32)
Calcium: 9.9 mg/dL (ref 8.4–10.5)
Chloride: 103 mEq/L (ref 96–112)
Creatinine, Ser: 1.14 mg/dL (ref 0.40–1.50)
GFR: 60.39 mL/min (ref >60.00)
Glucose, Bld: 116 mg/dL — ABNORMAL HIGH (ref 70–99)
Potassium: 5.2 mEq/L — ABNORMAL HIGH (ref 3.5–5.1)
Sodium: 137 mEq/L (ref 135–145)
Total Bilirubin: 0.9 mg/dL (ref 0.2–1.2)
Total Protein: 6.8 g/dL (ref 6.0–8.3)

## 2019-07-30 LAB — LIPID PANEL
Cholesterol: 172 mg/dL (ref 0–200)
HDL: 35.1 mg/dL — ABNORMAL LOW (ref >39.00)
LDL Cholesterol: 118 mg/dL — ABNORMAL HIGH (ref 0–99)
NonHDL: 136.6
Total CHOL/HDL Ratio: 5
Triglycerides: 94 mg/dL (ref 0.0–149.0)
VLDL: 18.8 mg/dL (ref 0.0–40.0)

## 2019-07-30 LAB — CBC WITH DIFFERENTIAL/PLATELET
Basophils Absolute: 0.1 10*3/uL (ref 0.0–0.1)
Basophils Relative: 0.6 % (ref 0.0–3.0)
Eosinophils Absolute: 0.7 10*3/uL (ref 0.0–0.7)
Eosinophils Relative: 7.4 % — ABNORMAL HIGH (ref 0.0–5.0)
HCT: 44.6 % (ref 39.0–52.0)
Hemoglobin: 15.1 g/dL (ref 13.0–17.0)
Lymphocytes Relative: 18.3 % (ref 12.0–46.0)
Lymphs Abs: 1.7 10*3/uL (ref 0.7–4.0)
MCHC: 33.9 g/dL (ref 30.0–36.0)
MCV: 91.3 fl (ref 78.0–100.0)
Monocytes Absolute: 0.7 10*3/uL (ref 0.1–1.0)
Monocytes Relative: 7.9 % (ref 3.0–12.0)
Neutro Abs: 6.1 10*3/uL (ref 1.4–7.7)
Neutrophils Relative %: 65.8 % (ref 43.0–77.0)
Platelets: 235 10*3/uL (ref 150.0–400.0)
RBC: 4.89 Mil/uL (ref 4.22–5.81)
RDW: 13.4 % (ref 11.5–15.5)
WBC: 9.2 10*3/uL (ref 4.0–10.5)

## 2019-07-30 MED ORDER — PROPRANOLOL HCL 40 MG PO TABS: 20.0000 mg | ORAL_TABLET | Freq: Two times a day (BID) | ORAL | 0 refills | Status: DC

## 2019-07-30 MED ORDER — LOSARTAN POTASSIUM 100 MG PO TABS: 100.0000 mg | ORAL_TABLET | Freq: Every day | ORAL | 1 refills | Status: DC

## 2019-07-30 NOTE — Assessment & Plan Note (Signed)
Chronic Check lipid panel  Diet controlled Regular exercise and healthy diet encouraged

## 2019-07-30 NOTE — Assessment & Plan Note (Signed)
Chronic Check a1c Low sugar / carb diet Stressed regular exercise  

## 2019-07-30 NOTE — Assessment & Plan Note (Addendum)
Chronic BP well controlled Current regimen effective and well tolerated Continue current medications at current doses cmp, CBC

## 2019-07-31 LAB — HEMOGLOBIN A1C: Hgb A1c MFr Bld: 5.7 % (ref 4.6–6.5)

## 2019-12-15 ENCOUNTER — Other Ambulatory Visit: Payer: Self-pay | Admitting: Internal Medicine

## 2020-01-14 ENCOUNTER — Other Ambulatory Visit: Payer: Self-pay | Admitting: Internal Medicine

## 2020-01-22 ENCOUNTER — Other Ambulatory Visit: Payer: Self-pay | Admitting: Internal Medicine

## 2020-01-28 ENCOUNTER — Other Ambulatory Visit: Payer: Self-pay

## 2020-01-28 NOTE — Patient Instructions (Addendum)
    Blood work was ordered.      Medications changes include :   none     Please followup in 6 months  

## 2020-01-28 NOTE — Progress Notes (Signed)
Subjective:    Patient ID: Christopher Richards, male    DOB: July 26, 1927, 86 y.o.   MRN: [id number]  HPI The patient is here for follow up of their chronic medical problems, including htn, prediabetes, hyperlipidemia, COPD with chronic SOB and cough  He is exercising regularly.   He walks.  He states he should be doing more exercise.  BP controlled at home for the most part.  Occasionally he will see systolic in the upper 627O, but not very often.  He was unsure if he needed to take more medication.   Medications and allergies reviewed with patient and updated if appropriate.  Patient Active Problem List   Diagnosis Date Noted  . Hiatal hernia 07/29/2019  . Allergic rhinitis 07/25/2016  . Chronic sinusitis 01/20/2016  . Prediabetes 07/21/2015  . Inguinal hernia 03/23/2014  . Essential hypertension 04/01/2008  . Aortic insufficiency 04/01/2008  . SKIN CANCER, HX OF 04/01/2008  . Hyperlipidemia 06/26/2006  . BENIGN PROSTATIC HYPERTROPHY 06/26/2006    Current Outpatient Medications on File Prior to Visit  Medication Sig Dispense Refill  . ADVAIR DISKUS 100-50 MCG/DOSE AEPB TAKE 1 PUFF BY MOUTH TWICE A DAY 60 each 1  . amLODipine (NORVASC) 2.5 MG tablet TAKE 1 TABLET BY MOUTH EVERY DAY 90 tablet 3  . Cholecalciferol (VITAMIN D) 2000 UNITS tablet Take 2,000 Units by mouth daily.    . hydrochlorothiazide (HYDRODIURIL) 12.5 MG tablet TAKE 1 TABLET BY MOUTH EVERY DAY 90 tablet 0  . losartan (COZAAR) 100 MG tablet TAKE 1 TABLET BY MOUTH EVERY DAY 90 tablet 1  . Multiple Vitamin (MULTIVITAMIN WITH MINERALS) TABS tablet Take 1 tablet by mouth daily.    . propranolol (INDERAL) 40 MG tablet TAKE 1/2 TAB BY MOUTH TWICE A DAY 90 tablet 0   No current facility-administered medications on file prior to visit.    Past Medical History:  Diagnosis Date  . Arthritis   . Basal cell cancer    SKIN.Marland KitchenFACE AND HAND  . Benign prostatic hypertrophy   . Bronchitis    11/2013   . Chest pain 1992    NEGATIVE CATH  . Hyperglycemia    PMH of  . Hyperlipidemia   . Hypertension   . Pneumonia 2009   treated as OP  . Tachycardia    hx of per patient     Past Surgical History:  Procedure Laterality Date  . BASAL CELL CARCINOMA EXCISION     X 3  . cataract surgery      bilateral  . COLONOSCOPY  2003   [facility]  . HERNIA REPAIR  1972   Dr  Marylene Buerger  . INGUINAL HERNIA REPAIR Right 04/06/2014   Procedure: REPAIR RIGHT INGUINAL HERNIA WITH MESH;  Surgeon: Jackolyn Confer, MD;  Location: [facility];  Service: General;  Laterality: Right;  . INSERTION OF MESH Right 04/06/2014   Procedure: INSERTION OF MESH;  Surgeon: Jackolyn Confer, MD;  Location: [facility];  Service: General;  Laterality: Right;    Social History   Socioeconomic History  . Marital status: Married    Spouse name: Not on file  . Number of children: 2  . Years of education: 52  . Highest education level: Not on file  Occupational History  . Occupation: [employer]  Tobacco Use  . Smoking status: Never Smoker  . Smokeless tobacco: Never Used  Substance and Sexual Activity  . Alcohol use: No  . Drug use: No  . Sexual activity: Not Currently  Other Topics Concern  . Not on file  Social History Narrative   Denies abuse and feels safe at home.   Social Determinants of Health   Financial Resource Strain: Not on file  Food Insecurity: Not on file  Transportation Needs: Not on file  Physical Activity: Not on file  Stress: Not on file  Social Connections: Not on file    Family History  Problem Relation Age of Onset  . Alcohol abuse Brother   . Hypertension Mother   . Heart disease Mother        AF  . Transient ischemic attack Mother   . Heart attack Father 14  . Hypertension Father   . Lung disease Father        ? [facility] visitor)  . Cancer Neg Hx   . Diabetes Neg Hx     Review of Systems  Constitutional: Negative for appetite change, chills and fever.  Respiratory: Positive for cough (dry  ). Negative for shortness of breath and wheezing.   Cardiovascular: Positive for leg swelling (occ). Negative for chest pain and palpitations.  Gastrointestinal: Positive for abdominal pain. Negative for nausea.  Musculoskeletal: Positive for back pain and neck pain.  Neurological: Negative for dizziness, light-headedness and headaches.       Objective:   Vitals:   01/29/20 0929  BP: 130/80  Pulse: 72  Temp: 98.2 F (36.8 C)  SpO2: 97%   BP Readings from Last 3 Encounters:  01/29/20 130/80  07/30/19 126/82  01/29/19 126/78   Wt Readings from Last 3 Encounters:  01/29/20 162 lb 6.4 oz (73.7 kg)  07/30/19 162 lb 3.2 oz (73.6 kg)  01/29/19 161 lb 12.8 oz (73.4 kg)   Body mass index is 25.44 kg/m.   Physical Exam    Constitutional: Appears well-developed and well-nourished. No distress.  HENT:  Head: Normocephalic and atraumatic.  Neck: Neck supple. No tracheal deviation present. No thyromegaly present.  No cervical lymphadenopathy Cardiovascular: Normal rate, regular rhythm and normal heart sounds.   2/6 systolic murmur heard. No carotid bruit .  No edema Pulmonary/Chest: Effort normal and breath sounds normal. No respiratory distress. No has no wheezes. No rales.  Skin: Skin is warm and dry. Not diaphoretic.  Psychiatric: Normal mood and affect. Behavior is normal.      Assessment & Plan:    See Problem List for Assessment and Plan of chronic medical problems.    This visit occurred during the SARS-CoV-2 public health emergency.  Safety protocols were in place, including screening questions prior to the visit, additional usage of staff PPE, and extensive cleaning of exam room while observing appropriate contact time as indicated for disinfecting solutions.

## 2020-01-29 ENCOUNTER — Ambulatory Visit (INDEPENDENT_AMBULATORY_CARE_PROVIDER_SITE_OTHER): Payer: Medicare Other | Admitting: Internal Medicine

## 2020-01-29 ENCOUNTER — Encounter: Payer: Self-pay | Admitting: Internal Medicine

## 2020-01-29 VITALS — BP 130/80 | HR 72 | Temp 98.2°F | Ht 67.0 in | Wt 162.4 lb

## 2020-01-29 DIAGNOSIS — I351 Nonrheumatic aortic (valve) insufficiency: Secondary | ICD-10-CM | POA: Diagnosis not present

## 2020-01-29 DIAGNOSIS — J45909 Unspecified asthma, uncomplicated: Secondary | ICD-10-CM | POA: Insufficient documentation

## 2020-01-29 DIAGNOSIS — R7303 Prediabetes: Secondary | ICD-10-CM | POA: Diagnosis not present

## 2020-01-29 DIAGNOSIS — I10 Essential (primary) hypertension: Secondary | ICD-10-CM | POA: Diagnosis not present

## 2020-01-29 DIAGNOSIS — E7849 Other hyperlipidemia: Secondary | ICD-10-CM

## 2020-01-29 DIAGNOSIS — J4530 Mild persistent asthma, uncomplicated: Secondary | ICD-10-CM

## 2020-01-29 LAB — COMPREHENSIVE METABOLIC PANEL
ALT: 15 U/L (ref 0–53)
AST: 18 U/L (ref 0–37)
Albumin: 4.2 g/dL (ref 3.5–5.2)
Alkaline Phosphatase: 68 U/L (ref 39–117)
BUN: 22 mg/dL (ref 6–23)
CO2: 30 mEq/L (ref 19–32)
Calcium: 9.6 mg/dL (ref 8.4–10.5)
Chloride: 104 mEq/L (ref 96–112)
Creatinine, Ser: 1.17 mg/dL (ref 0.40–1.50)
GFR: 54.99 mL/min — ABNORMAL LOW (ref >60.00)
Glucose, Bld: 108 mg/dL — ABNORMAL HIGH (ref 70–99)
Potassium: 4.1 mEq/L (ref 3.5–5.1)
Sodium: 139 mEq/L (ref 135–145)
Total Bilirubin: 0.8 mg/dL (ref 0.2–1.2)
Total Protein: 7.3 g/dL (ref 6.0–8.3)

## 2020-01-29 LAB — LIPID PANEL
Cholesterol: 175 mg/dL (ref 0–200)
HDL: 37 mg/dL — ABNORMAL LOW (ref >39.00)
LDL Cholesterol: 116 mg/dL — ABNORMAL HIGH (ref 0–99)
NonHDL: 137.87
Total CHOL/HDL Ratio: 5
Triglycerides: 110 mg/dL (ref 0.0–149.0)
VLDL: 22 mg/dL (ref 0.0–40.0)

## 2020-01-29 LAB — CBC WITH DIFFERENTIAL/PLATELET
Basophils Absolute: 0.1 10*3/uL (ref 0.0–0.1)
Basophils Relative: 0.6 % (ref 0.0–3.0)
Eosinophils Absolute: 0.7 10*3/uL (ref 0.0–0.7)
Eosinophils Relative: 7 % — ABNORMAL HIGH (ref 0.0–5.0)
HCT: 45.7 % (ref 39.0–52.0)
Hemoglobin: 15.3 g/dL (ref 13.0–17.0)
Lymphocytes Relative: 17.5 % (ref 12.0–46.0)
Lymphs Abs: 1.7 10*3/uL (ref 0.7–4.0)
MCHC: 33.5 g/dL (ref 30.0–36.0)
MCV: 89.7 fl (ref 78.0–100.0)
Monocytes Absolute: 0.8 10*3/uL (ref 0.1–1.0)
Monocytes Relative: 7.8 % (ref 3.0–12.0)
Neutro Abs: 6.5 10*3/uL (ref 1.4–7.7)
Neutrophils Relative %: 67.1 % (ref 43.0–77.0)
Platelets: 280 10*3/uL (ref 150.0–400.0)
RBC: 5.09 Mil/uL (ref 4.22–5.81)
RDW: 13.8 % (ref 11.5–15.5)
WBC: 9.7 10*3/uL (ref 4.0–10.5)

## 2020-01-29 LAB — HEMOGLOBIN A1C: Hgb A1c MFr Bld: 5.5 % (ref 4.6–6.5)

## 2020-01-29 NOTE — Assessment & Plan Note (Signed)
Chronic Stable on exam Asymptomatic

## 2020-01-29 NOTE — Assessment & Plan Note (Signed)
Chronic Check lipid panel  Diet controlled Regular exercise and healthy diet encouraged

## 2020-01-29 NOTE — Assessment & Plan Note (Signed)
Chronic Check a1c Low sugar / carb diet Stressed regular exercise  

## 2020-01-29 NOTE — Assessment & Plan Note (Signed)
Chronic ?  COPD Doing well on Advair 100-50 mcg per dose twice daily Denies shortness of breath, wheezing  Continue Advair at current dose

## 2020-01-29 NOTE — Assessment & Plan Note (Signed)
Chronic BP well controlled Occasionally at home he has an elevated reading-given his age and the mild elevation I would not treat this Continue amlodipine 2.5 mg daily, hydrochlorothiazide 12.5 mg daily, losartan 100 mg daily and propranolol 20 mg twice daily cmp

## 2020-04-16 ENCOUNTER — Other Ambulatory Visit: Payer: Self-pay | Admitting: Internal Medicine

## 2020-05-17 ENCOUNTER — Other Ambulatory Visit: Payer: Self-pay | Admitting: Internal Medicine

## 2020-06-23 ENCOUNTER — Ambulatory Visit: Payer: Medicare Other

## 2020-07-05 ENCOUNTER — Other Ambulatory Visit: Payer: Self-pay

## 2020-07-05 ENCOUNTER — Ambulatory Visit (INDEPENDENT_AMBULATORY_CARE_PROVIDER_SITE_OTHER): Payer: Medicare Other

## 2020-07-05 DIAGNOSIS — Z0000 Encounter for general adult medical examination without abnormal findings: Secondary | ICD-10-CM

## 2020-07-05 NOTE — Patient Instructions (Addendum)
Christopher Richards , Thank you for taking time to come for your Medicare Wellness Visit. I appreciate your ongoing commitment to your health goals. Please review the following plan we discussed and let me know if I can assist you in the future.   Screening recommendations/referrals: Colonoscopy: no repeat due to age Recommended yearly ophthalmology/optometry visit for glaucoma screening and checkup Recommended yearly dental visit for hygiene and checkup  Vaccinations: Influenza vaccine: 11/2019 Pneumococcal vaccine: 06/23/2014, 03/09/2015 Tdap vaccine: 11/21/2011 Shingles vaccine: never done   Covid-19: 03/10/2019, 2/8/20021  Advanced directives: Please bring a copy of your health care power of attorney and living will to the office at your convenience.  Conditions/risks identified: Yes. Reviewed health maintenance screenings with patient today and relevant education, vaccines, and/or referrals were provided. Continue doing brain stimulating activities (puzzles, reading, adult coloring books, staying active) to keep memory sharp. Continue to eat heart healthy diet (full of fruits, vegetables, whole grains, lean protein, water--limit salt, fat, and sugar intake) and increase physical activity as tolerated.  Next appointment: Please schedule your next Medicare Wellness Visit with your Nurse Health Advisor in 1 year by calling [phone].  Preventive Care 85 Years and Older, Male Preventive care refers to lifestyle choices and visits with your health care provider that can promote health and wellness. What does preventive care include?  A yearly physical exam. This is also called an annual well check.  Dental exams once or twice a year.  Routine eye exams. Ask your health care provider how often you should have your eyes checked.  Personal lifestyle choices, including:  Daily care of your teeth and gums.  Regular physical activity.  Eating a healthy diet.  Avoiding tobacco and drug  use.  Limiting alcohol use.  Practicing safe sex.  Taking low doses of aspirin every day.  Taking vitamin and mineral supplements as recommended by your health care provider. What happens during an annual well check? The services and screenings done by your health care provider during your annual well check will depend on your age, overall health, lifestyle risk factors, and family history of disease. Counseling  Your health care provider may ask you questions about your:  Alcohol use.  Tobacco use.  Drug use.  Emotional well-being.  Home and relationship well-being.  Sexual activity.  Eating habits.  History of falls.  Memory and ability to understand (cognition).  Work and work Statistician. Screening  You may have the following tests or measurements:  Height, weight, and BMI.  Blood pressure.  Lipid and cholesterol levels. These may be checked every 5 years, or more frequently if you are over 85 years old.  Skin check.  Lung cancer screening. You may have this screening every year starting at age 85 if you have a 30-pack-year history of smoking and currently smoke or have quit within the past 15 years.  Fecal occult blood test (FOBT) of the stool. You may have this test every year starting at age 85.  Flexible sigmoidoscopy or colonoscopy. You may have a sigmoidoscopy every 5 years or a colonoscopy every 10 years starting at age 85.  Prostate cancer screening. Recommendations will vary depending on your family history and other risks.  Hepatitis C blood test.  Hepatitis B blood test.  Sexually transmitted disease (STD) testing.  Diabetes screening. This is done by checking your blood sugar (glucose) after you have not eaten for a while (fasting). You may have this done every 1-3 years.  Abdominal aortic aneurysm (AAA) screening. You may  need this if you are a current or former smoker.  Osteoporosis. You may be screened starting at age 38 if you are at  high risk. Talk with your health care provider about your test results, treatment options, and if necessary, the need for more tests. Vaccines  Your health care provider may recommend certain vaccines, such as:  Influenza vaccine. This is recommended every year.  Tetanus, diphtheria, and acellular pertussis (Tdap, Td) vaccine. You may need a Td booster every 10 years.  Zoster vaccine. You may need this after age 85.  Pneumococcal 13-valent conjugate (PCV13) vaccine. One dose is recommended after age 85.  Pneumococcal polysaccharide (PPSV23) vaccine. One dose is recommended after age 85. Talk to your health care provider about which screenings and vaccines you need and how often you need them. This information is not intended to replace advice given to you by your health care provider. Make sure you discuss any questions you have with your health care provider. Document Released: 03/04/2015 Document Revised: 10/26/2015 Document Reviewed: 12/07/2014 Elsevier Interactive Patient Education  2017 [place] Prevention in the Home Falls can cause injuries. They can happen to people of all ages. There are many things you can do to make your home safe and to help prevent falls. What can I do on the outside of my home?  Regularly fix the edges of walkways and driveways and fix any cracks.  Remove anything that might make you trip as you walk through a door, such as a raised step or threshold.  Trim any bushes or trees on the path to your home.  Use bright outdoor lighting.  Clear any walking paths of anything that might make someone trip, such as rocks or tools.  Regularly check to see if handrails are loose or broken. Make sure that both sides of any steps have handrails.  Any raised decks and porches should have guardrails on the edges.  Have any leaves, snow, or ice cleared regularly.  Use sand or salt on walking paths during winter.  Clean up any spills in your garage  right away. This includes oil or grease spills. What can I do in the bathroom?  Use night lights.  Install grab bars by the toilet and in the tub and shower. Do not use towel bars as grab bars.  Use non-skid mats or decals in the tub or shower.  If you need to sit down in the shower, use a plastic, non-slip stool.  Keep the floor dry. Clean up any water that spills on the floor as soon as it happens.  Remove soap buildup in the tub or shower regularly.  Attach bath mats securely with double-sided non-slip rug tape.  Do not have throw rugs and other things on the floor that can make you trip. What can I do in the bedroom?  Use night lights.  Make sure that you have a light by your bed that is easy to reach.  Do not use any sheets or blankets that are too big for your bed. They should not hang down onto the floor.  Have a firm chair that has side arms. You can use this for support while you get dressed.  Do not have throw rugs and other things on the floor that can make you trip. What can I do in the kitchen?  Clean up any spills right away.  Avoid walking on wet floors.  Keep items that you use a lot in easy-to-reach places.  If  you need to reach something above you, use a strong step stool that has a grab bar.  Keep electrical cords out of the way.  Do not use floor polish or wax that makes floors slippery. If you must use wax, use non-skid floor wax.  Do not have throw rugs and other things on the floor that can make you trip. What can I do with my stairs?  Do not leave any items on the stairs.  Make sure that there are handrails on both sides of the stairs and use them. Fix handrails that are broken or loose. Make sure that handrails are as long as the stairways.  Check any carpeting to make sure that it is firmly attached to the stairs. Fix any carpet that is loose or worn.  Avoid having throw rugs at the top or bottom of the stairs. If you do have throw rugs,  attach them to the floor with carpet tape.  Make sure that you have a light switch at the top of the stairs and the bottom of the stairs. If you do not have them, ask someone to add them for you. What else can I do to help prevent falls?  Wear shoes that:  Do not have high heels.  Have rubber bottoms.  Are comfortable and fit you well.  Are closed at the toe. Do not wear sandals.  If you use a stepladder:  Make sure that it is fully opened. Do not climb a closed stepladder.  Make sure that both sides of the stepladder are locked into place.  Ask someone to hold it for you, if possible.  Clearly mark and make sure that you can see:  Any grab bars or handrails.  First and last steps.  Where the edge of each step is.  Use tools that help you move around (mobility aids) if they are needed. These include:  Canes.  Walkers.  Scooters.  Crutches.  Turn on the lights when you go into a dark area. Replace any light bulbs as soon as they burn out.  Set up your furniture so you have a clear path. Avoid moving your furniture around.  If any of your floors are uneven, fix them.  If there are any pets around you, be aware of where they are.  Review your medicines with your doctor. Some medicines can make you feel dizzy. This can increase your chance of falling. Ask your doctor what other things that you can do to help prevent falls. This information is not intended to replace advice given to you by your health care provider. Make sure you discuss any questions you have with your health care provider. Document Released: 12/02/2008 Document Revised: 07/14/2015 Document Reviewed: 03/12/2014 Elsevier Interactive Patient Education  2017 [employer].

## 2020-07-05 NOTE — Progress Notes (Signed)
I connected with Christopher Richards today by telephone and verified that I am speaking with the correct person using two identifiers. Location patient: home Location provider: work Persons participating in the virtual visit: Christopher Richards and Christopher Abu, LPN..   I discussed the limitations, risks, security and privacy concerns of performing an evaluation and management service by telephone and the availability of in person appointments. I also discussed with the patient that there may be a patient responsible charge related to this service. The patient expressed understanding and verbally consented to this telephonic visit.    Interactive audio and video telecommunications were attempted between this provider and patient, however failed, due to patient having technical difficulties OR patient did not have access to video capability.  We continued and completed visit with audio only.  Some vital signs may be absent or patient reported.   Time Spent with patient on telephone encounter: 30 minutes  Subjective:   Christopher Richards is a 87 y.o. male who presents for Medicare Annual/Subsequent preventive examination.  Review of Systems    No ROS. Medicare Wellness Visit. Additional risk factors are reflected in social history. Cardiac Risk Factors include: advanced age (>11men, >55 women);hypertension;male gender;family history of premature cardiovascular disease     Objective:    There were no vitals filed for this visit. There is no height or weight on file to calculate BMI.  Advanced Directives 07/05/2020 05/22/2016 04/06/2014 03/31/2014  Does Patient Have a Medical Advance Directive? Yes Yes - Yes  Type of Advance Directive Living will;Healthcare Power of [place];Living will - Living will;Healthcare Power of Attorney  Does patient want to make changes to medical advance directive? No - Patient declined - - No - Patient declined  Copy of [place] in  Chart? No - copy requested No - copy requested (No Data) No - copy requested    Current Medications (verified) Outpatient Encounter Medications as of 07/05/2020  Medication Sig  . ADVAIR DISKUS 100-50 MCG/DOSE AEPB INHALE 1 PUFF BY MOUTH TWICE A DAY  . amLODipine (NORVASC) 2.5 MG tablet TAKE 1 TABLET BY MOUTH EVERY DAY  . Cholecalciferol (VITAMIN D) 2000 UNITS tablet Take 2,000 Units by mouth daily.  . hydrochlorothiazide (HYDRODIURIL) 12.5 MG tablet TAKE 1 TABLET BY MOUTH EVERY DAY  . losartan (COZAAR) 100 MG tablet TAKE 1 TABLET BY MOUTH EVERY DAY  . Multiple Vitamin (MULTIVITAMIN WITH MINERALS) TABS tablet Take 1 tablet by mouth daily.  . propranolol (INDERAL) 40 MG tablet TAKE 1/2 TABLET BY MOUTH TWICE A DAY   No facility-administered encounter medications on file as of 07/05/2020.    Allergies (verified) Azithromycin and Amlodipine   History: Past Medical History:  Diagnosis Date  . Arthritis   . Basal cell cancer    SKIN.[place] KitchenFACE AND HAND  . Benign prostatic hypertrophy   . Bronchitis    11/2013   . Chest pain 1992   NEGATIVE CATH  . Hyperglycemia    PMH of  . Hyperlipidemia   . Hypertension   . Pneumonia 2009   treated as OP  . Tachycardia    hx of per patient    Past Surgical History:  Procedure Laterality Date  . BASAL CELL CARCINOMA EXCISION     X 3  . cataract surgery      bilateral  . COLONOSCOPY  2003   [facility]  . HERNIA REPAIR  1972   Dr  Marylene Buerger  . INGUINAL HERNIA REPAIR Right  04/06/2014   Procedure: REPAIR RIGHT INGUINAL HERNIA WITH MESH;  Surgeon: Jackolyn Confer, MD;  Location: [facility];  Service: General;  Laterality: Right;  . INSERTION OF MESH Right 04/06/2014   Procedure: INSERTION OF MESH;  Surgeon: Jackolyn Confer, MD;  Location: [facility];  Service: General;  Laterality: Right;   Family History  Problem Relation Age of Onset  . Alcohol abuse Brother   . Hypertension Mother   . Heart disease Mother        AF  . Transient ischemic attack  Mother   . Heart attack Father 71  . Hypertension Father   . Lung disease Father        ? [facility] visitor)  . Cancer Neg Hx   . Diabetes Neg Hx    Social History   Socioeconomic History  . Marital status: Married    Spouse name: Not on file  . Number of children: 2  . Years of education: 26  . Highest education level: Not on file  Occupational History  . Occupation: [employer]  Tobacco Use  . Smoking status: Never Smoker  . Smokeless tobacco: Never Used  Substance and Sexual Activity  . Alcohol use: No  . Drug use: No  . Sexual activity: Not Currently  Other Topics Concern  . Not on file  Social History Narrative   Denies abuse and feels safe at home.   Social Determinants of Health   Financial Resource Strain: Low Risk   . Difficulty of Paying Living Expenses: Not hard at all  Food Insecurity: No Food Insecurity  . Worried About Charity fundraiser in the Last Year: Never true  . Ran Out of Food in the Last Year: Never true  Transportation Needs: No Transportation Needs  . Lack of Transportation (Medical): No  . Lack of Transportation (Non-Medical): No  Physical Activity: Sufficiently Active  . Days of Exercise per Week: 5 days  . Minutes of Exercise per Session: 30 min  Stress: No Stress Concern Present  . Feeling of Stress : Not at all  Social Connections: Socially Integrated  . Frequency of Communication with Friends and Family: More than three times a week  . Frequency of Social Gatherings with Friends and Family: More than three times a week  . Attends Religious Services: More than 4 times per year  . Active Member of Clubs or Organizations: Yes  . Attends Archivist Meetings: More than 4 times per year  . Marital Status: Married    Tobacco Counseling Counseling given: Not Answered   Clinical Intake:  Pre-visit preparation completed: Yes  Pain : No/denies pain     Nutritional Risks: None Diabetes: No  How often do you need  to have someone help you when you read instructions, pamphlets, or other written materials from your doctor or pharmacy?: 1 - Never What is the last grade level you completed in school?: Master's Degree  Diabetic? no  Interpreter Needed?: No  Information entered by :: Christopher Abu, LPN   Activities of Daily Living In your present state of health, do you have any difficulty performing the following activities: 07/05/2020 01/29/2020  Hearing? N N  Vision? N N  Difficulty concentrating or making decisions? N N  Walking or climbing stairs? N N  Dressing or bathing? N N  Doing errands, shopping? N N  Preparing Food and eating ? N -  Using the Toilet? N -  In the past six months, have you accidently leaked  urine? Y -  Do you have problems with loss of bowel control? N -  Managing your Medications? N -  Managing your Finances? N -  Housekeeping or managing your Housekeeping? N -  Some recent data might be hidden    Patient Care Team: Binnie Rail, MD as PCP - General (Internal Medicine)  Indicate any recent Medical Services you may have received from other than [facility] providers in the past year (date may be approximate).     Assessment:   This is a routine wellness examination for [place].  Hearing/Vision screen No exam data present  Dietary issues and exercise activities discussed: Current Exercise Habits: Home exercise routine, Type of exercise: walking, Time (Minutes): 30, Frequency (Times/Week): 5, Weekly Exercise (Minutes/Week): 150, Intensity: Mild, Exercise limited by: respiratory conditions(s)  Goals Addressed            This Visit's Progress   . Patient Stated       I want to remain independent and active as much as possible.      Depression Screen PHQ 2/9 Scores 07/05/2020 01/29/2020 07/30/2018 07/26/2017 05/22/2016 07/21/2015 03/23/2014  PHQ - 2 Score 0 0 0 0 1 0 0  Exception Documentation - - - - - - Patient refusal    Fall Risk Fall Risk  07/05/2020 07/30/2019  07/30/2018 09/19/2017 07/26/2017  Falls in the past year? 0 0 0 No No  Comment - - - Emmi Telephone Survey: data to providers prior to load -  Number falls in past yr: 0 0 0 - -  Injury with Fall? 0 0 - - -  Risk for fall due to : No Fall Risks No Fall Risks - - -  Follow up Falls evaluation completed Falls evaluation completed - - -    FALL RISK PREVENTION PERTAINING TO THE HOME:  Any stairs in or around the home? No  If so, are there any without handrails? No  Home free of loose throw rugs in walkways, pet beds, electrical cords, etc? Yes  Adequate lighting in your home to reduce risk of falls? Yes   ASSISTIVE DEVICES UTILIZED TO PREVENT FALLS:  Life alert? No  Use of a cane, walker or w/c? Yes  Grab bars in the bathroom? Yes  Shower chair or bench in shower? Yes  Elevated toilet seat or a handicapped toilet? Yes   TIMED UP AND GO:  Was the test performed? No .  Length of time to ambulate 10 feet: 0 sec.   Gait steady and fast without use of assistive device  Cognitive Function: MMSE - Mini Mental State Exam 05/22/2016  Orientation to time 5  Orientation to Place 5  Registration 3  Attention/ Calculation 5  Recall 2  Language- name 2 objects 2  Language- repeat 1  Language- follow 3 step command 3  Language- read & follow direction 1  Write a sentence 1  Copy design 1  Total score 29        Immunizations Immunization History  Administered Date(s) Administered  . Fluad Quad(high Dose 65+) 12/03/2018  . Influenza, High Dose Seasonal PF 11/19/2017  . Influenza-Unspecified 11/24/2013, 11/17/2015, 11/26/2016  . PFIZER(Purple Top)SARS-COV-2 Vaccination 03/10/2019, 03/30/2019  . Pneumococcal Conjugate-13 06/23/2014  . Pneumococcal Polysaccharide-23 03/09/2015  . Tdap 11/21/2011    TDAP status: Up to date  Flu Vaccine status: Up to date  Pneumococcal vaccine status: Up to date  Covid-19 vaccine status: Completed vaccines  Qualifies for Shingles Vaccine? Yes    Zostavax completed  No   Shingrix Completed?: No.    Education has been provided regarding the importance of this vaccine. Patient has been advised to call insurance company to determine out of pocket expense if they have not yet received this vaccine. Advised may also receive vaccine at local pharmacy or Health Dept. Verbalized acceptance and understanding.  Screening Tests Health Maintenance  Topic Date Due  . COVID-19 Vaccine (3 - Booster for Pfizer series) 08/27/2019  . INFLUENZA VACCINE  09/19/2020  . TETANUS/TDAP  11/20/2021  . PNA vac Low Risk Adult  Completed  . HPV VACCINES  Aged Out    Health Maintenance  Health Maintenance Due  Topic Date Due  . COVID-19 Vaccine (3 - Booster for Pfizer series) 08/27/2019    Colorectal cancer screening: No longer required.   Lung Cancer Screening: (Low Dose CT Chest recommended if Age 94-80 years, 30 pack-year currently smoking OR have quit w/in 15years.) does not qualify.   Lung Cancer Screening Referral: no  Additional Screening:  Hepatitis C Screening: does not qualify; Completed no  Vision Screening: Recommended annual ophthalmology exams for early detection of glaucoma and other disorders of the eye. Is the patient up to date with their annual eye exam?  Yes  Who is the provider or what is the name of the office in which the patient attends annual eye exams? Heather [place], OD. If pt is not established with a provider, would they like to be referred to a provider to establish care? No .   Dental Screening: Recommended annual dental exams for proper oral hygiene  Community Resource Referral / Chronic Care Management: CRR required this visit?  No   CCM required this visit?  No      Plan:     I have personally reviewed and noted the following in the patient's chart:   . Medical and social history . Use of alcohol, tobacco or illicit drugs  . Current medications and supplements including opioid prescriptions. Patient is  not currently taking opioid prescriptions. . Functional ability and status . Nutritional status . Physical activity . Advanced directives . List of other physicians . Hospitalizations, surgeries, and ER visits in previous 12 months . Vitals . Screenings to include cognitive, depression, and falls . Referrals and appointments  In addition, I have reviewed and discussed with patient certain preventive protocols, quality metrics, and best practice recommendations. A written personalized care plan for preventive services as well as general preventive health recommendations were provided to patient.     Sheral Flow, LPN   3/50/0938   Nurse Notes:  Patient is cogitatively intact. There were no vitals filed for this visit. There is no height or weight on file to calculate BMI.

## 2020-07-25 ENCOUNTER — Other Ambulatory Visit: Payer: Self-pay | Admitting: Internal Medicine

## 2020-07-28 ENCOUNTER — Encounter: Payer: Self-pay | Admitting: Internal Medicine

## 2020-07-28 NOTE — Progress Notes (Signed)
Subjective:    Patient ID: Christopher Richards, male    DOB: 05/21/1927, 87 y.o.   MRN: [id number]  HPI The patient is here for follow up of their chronic medical problems, including htn, prediabetes, hld, COPD w chronic SOB and cough.  He walks for exercise., but less.    He is taking all of his medications as prescribed.    Medications and allergies reviewed with patient and updated if appropriate.  Patient Active Problem List   Diagnosis Date Noted   Reactive airway disease 01/29/2020   Hiatal hernia 07/29/2019   Allergic rhinitis 07/25/2016   Chronic sinusitis 01/20/2016   Prediabetes 07/21/2015   Inguinal hernia 03/23/2014   Essential hypertension 04/01/2008   Aortic insufficiency 04/01/2008   SKIN CANCER, HX OF 04/01/2008   Hyperlipidemia 06/26/2006   BENIGN PROSTATIC HYPERTROPHY 06/26/2006    Current Outpatient Medications on File Prior to Visit  Medication Sig Dispense Refill   ADVAIR DISKUS 100-50 MCG/ACT AEPB TAKE 1 PUFF BY MOUTH TWICE A DAY 60 each 1   amLODipine (NORVASC) 2.5 MG tablet TAKE 1 TABLET BY MOUTH EVERY DAY 90 tablet 3   Cholecalciferol (VITAMIN D) 2000 UNITS tablet Take 2,000 Units by mouth daily.     hydrochlorothiazide (HYDRODIURIL) 12.5 MG tablet TAKE 1 TABLET BY MOUTH EVERY DAY 90 tablet 0   losartan (COZAAR) 100 MG tablet TAKE 1 TABLET BY MOUTH EVERY DAY 90 tablet 1   Multiple Vitamin (MULTIVITAMIN WITH MINERALS) TABS tablet Take 1 tablet by mouth daily.     propranolol (INDERAL) 40 MG tablet TAKE 1/2 TABLET BY MOUTH TWICE A DAY 90 tablet 0   No current facility-administered medications on file prior to visit.    Past Medical History:  Diagnosis Date   Arthritis    Basal cell cancer    SKIN.Marland KitchenFACE AND HAND   Benign prostatic hypertrophy    Bronchitis    11/2013    Chest pain 1992   NEGATIVE CATH   Hyperglycemia    PMH of   Hyperlipidemia    Hypertension    Pneumonia 2009   treated as OP   Tachycardia    hx of per patient     Past  Surgical History:  Procedure Laterality Date   BASAL CELL CARCINOMA EXCISION     X 3   cataract surgery      bilateral   COLONOSCOPY  2003   [facility]   HERNIA REPAIR  1972   Dr  Marylene Buerger   INGUINAL HERNIA REPAIR Right 04/06/2014   Procedure: REPAIR RIGHT INGUINAL HERNIA WITH MESH;  Surgeon: Jackolyn Confer, MD;  Location: [facility];  Service: General;  Laterality: Right;   INSERTION OF MESH Right 04/06/2014   Procedure: INSERTION OF MESH;  Surgeon: Jackolyn Confer, MD;  Location: [facility];  Service: General;  Laterality: Right;    Social History   Socioeconomic History   Marital status: Married    Spouse name: Not on file   Number of children: 2   Years of education: 18   Highest education level: Not on file  Occupational History   Occupation: [employer]  Tobacco Use   Smoking status: Never   Smokeless tobacco: Never  Substance and Sexual Activity   Alcohol use: No   Drug use: No   Sexual activity: Not Currently  Other Topics Concern   Not on file  Social History Narrative   Denies abuse and feels safe at home.   Social Determinants of Health  Financial Resource Strain: Low Risk    Difficulty of Paying Living Expenses: Not hard at all  Food Insecurity: No Food Insecurity   Worried About Charity fundraiser in the Last Year: Never true   Ran Out of Food in the Last Year: Never true  Transportation Needs: No Transportation Needs   Lack of Transportation (Medical): No   Lack of Transportation (Non-Medical): No  Physical Activity: Sufficiently Active   Days of Exercise per Week: 5 days   Minutes of Exercise per Session: 30 min  Stress: No Stress Concern Present   Feeling of Stress : Not at all  Social Connections: Socially Integrated   Frequency of Communication with Friends and Family: More than three times a week   Frequency of Social Gatherings with Friends and Family: More than three times a week   Attends Religious Services: More than 4 times per year   Active  Member of [employer] or Organizations: Yes   Attends Music therapist: More than 4 times per year   Marital Status: Married    Family History  Problem Relation Age of Onset   Alcohol abuse Brother    Hypertension Mother    Heart disease Mother        AF   Transient ischemic attack Mother    Heart attack Father 105   Hypertension Father    Lung disease Father        ? [facility] visitor)   Cancer Neg Hx    Diabetes Neg Hx     Review of Systems  Respiratory:  Negative for cough, shortness of breath and wheezing.   Cardiovascular:  Negative for chest pain, palpitations and leg swelling.  Neurological:  Negative for light-headedness and headaches.      Objective:   Vitals:   07/29/20 1018  BP: 130/76  Pulse: 64  Temp: 97.9 F (36.6 C)  SpO2: 97%   BP Readings from Last 3 Encounters:  07/29/20 130/76  01/29/20 130/80  07/30/19 126/82   Wt Readings from Last 3 Encounters:  07/29/20 159 lb (72.1 kg)  01/29/20 162 lb 6.4 oz (73.7 kg)  07/30/19 162 lb 3.2 oz (73.6 kg)   Body mass index is 24.9 kg/m.   Physical Exam    Constitutional: Appears well-developed and well-nourished. No distress.  HENT:  Head: Normocephalic and atraumatic.  Neck: Neck supple. No tracheal deviation present. No thyromegaly present.  No cervical lymphadenopathy Cardiovascular: Normal rate, regular rhythm and normal heart sounds.   No murmur heard. No carotid bruit .  No edema Pulmonary/Chest: Effort normal and breath sounds normal. No respiratory distress. No has no wheezes. No rales.  Skin: Skin is warm and dry. Not diaphoretic.  Psychiatric: Normal mood and affect. Behavior is normal.      Assessment & Plan:    See Problem List for Assessment and Plan of chronic medical problems.    This visit occurred during the SARS-CoV-2 public health emergency.  Safety protocols were in place, including screening questions prior to the visit, additional usage of staff PPE, and  extensive cleaning of exam room while observing appropriate contact time as indicated for disinfecting solutions.

## 2020-07-28 NOTE — Patient Instructions (Addendum)
    Blood work was ordered.      Medications changes include :   none     Please followup in 6 months  

## 2020-07-29 ENCOUNTER — Ambulatory Visit (INDEPENDENT_AMBULATORY_CARE_PROVIDER_SITE_OTHER): Payer: Medicare Other | Admitting: Internal Medicine

## 2020-07-29 ENCOUNTER — Other Ambulatory Visit: Payer: Self-pay

## 2020-07-29 VITALS — BP 130/76 | HR 64 | Temp 97.9°F | Ht 67.0 in | Wt 159.0 lb

## 2020-07-29 DIAGNOSIS — I10 Essential (primary) hypertension: Secondary | ICD-10-CM | POA: Diagnosis not present

## 2020-07-29 DIAGNOSIS — E7849 Other hyperlipidemia: Secondary | ICD-10-CM | POA: Diagnosis not present

## 2020-07-29 DIAGNOSIS — R7303 Prediabetes: Secondary | ICD-10-CM

## 2020-07-29 LAB — COMPREHENSIVE METABOLIC PANEL
ALT: 13 U/L (ref 0–53)
AST: 17 U/L (ref 0–37)
Albumin: 4.2 g/dL (ref 3.5–5.2)
Alkaline Phosphatase: 66 U/L (ref 39–117)
BUN: 21 mg/dL (ref 6–23)
CO2: 27 mEq/L (ref 19–32)
Calcium: 9.9 mg/dL (ref 8.4–10.5)
Chloride: 105 mEq/L (ref 96–112)
Creatinine, Ser: 1.14 mg/dL (ref 0.40–1.50)
GFR: 56.54 mL/min — ABNORMAL LOW (ref >60.00)
Glucose, Bld: 93 mg/dL (ref 70–99)
Potassium: 5.4 mEq/L — ABNORMAL HIGH (ref 3.5–5.1)
Sodium: 140 mEq/L (ref 135–145)
Total Bilirubin: 0.9 mg/dL (ref 0.2–1.2)
Total Protein: 7.2 g/dL (ref 6.0–8.3)

## 2020-07-29 LAB — HEMOGLOBIN A1C: Hgb A1c MFr Bld: 5.6 % (ref 4.6–6.5)

## 2020-07-29 NOTE — Assessment & Plan Note (Signed)
Chronic Lifestyle controlled Encouraged him to continue as much activity as possible

## 2020-07-29 NOTE — Assessment & Plan Note (Signed)
Chronic Check a1c Low sugar / carb diet Stressed regular exercise  

## 2020-07-29 NOTE — Assessment & Plan Note (Addendum)
Chronic BP well controlled Continue hctz 12.5 mg, lodartan 100 mg daily, propranolol 20 mg bid, amodipin 2.5 mg qd cmp

## 2020-08-11 ENCOUNTER — Other Ambulatory Visit: Payer: Self-pay | Admitting: Internal Medicine

## 2020-08-12 MED ORDER — FLUTICASONE-SALMETEROL 100-50 MCG/ACT IN AEPB: INHALATION_SPRAY | RESPIRATORY_TRACT | 1 refills | Status: DC

## 2020-08-12 NOTE — Telephone Encounter (Signed)
Resent rx fax through epic.Marland KitchenJohny Richards

## 2020-08-12 NOTE — Addendum Note (Signed)
Addended by: Earnstine Regal on: 08/12/2020 10:10 AM   Modules accepted: Orders

## 2020-12-12 ENCOUNTER — Other Ambulatory Visit: Payer: Self-pay | Admitting: Internal Medicine

## 2020-12-15 ENCOUNTER — Other Ambulatory Visit: Payer: Self-pay

## 2020-12-15 ENCOUNTER — Telehealth: Payer: Self-pay | Admitting: Internal Medicine

## 2020-12-15 ENCOUNTER — Encounter: Payer: Self-pay | Admitting: Internal Medicine

## 2020-12-15 MED ORDER — FLUTICASONE-SALMETEROL 100-50 MCG/ACT IN AEPB: INHALATION_SPRAY | RESPIRATORY_TRACT | 1 refills | Status: DC

## 2020-12-15 NOTE — Telephone Encounter (Signed)
Sent in today 

## 2020-12-15 NOTE — Telephone Encounter (Signed)
1.Medication Requested: fluticasone-salmeterol (ADVAIR DISKUS) 100-50 MCG/ACT AEPB 2. Pharmacy (Name, [place]): [id number] - [place], [address]. AT [address] Phone:  [phone]  Fax:  [phone]     3. On Med List: Y  4. Last Visit with PCP: 07/29/2020  5. Next visit date with PCP: 01/30/2021   Agent: Please be advised that RX refills may take up to 3 business days. We ask that you follow-up with your pharmacy.

## 2021-01-16 ENCOUNTER — Encounter: Payer: Self-pay | Admitting: Internal Medicine

## 2021-01-17 ENCOUNTER — Other Ambulatory Visit: Payer: Self-pay | Admitting: Internal Medicine

## 2021-01-29 NOTE — Progress Notes (Signed)
Subjective:    Patient ID: Christopher Richards, male    DOB: 1927/11/26, 87 y.o.   MRN: [id number]  This visit occurred during the SARS-CoV-2 public health emergency.  Safety protocols were in place, including screening questions prior to the visit, additional usage of staff PPE, and extensive cleaning of exam room while observing appropriate contact time as indicated for disinfecting solutions.     HPI The patient is here for follow up of their chronic medical problems, including htn, hld, prediabetes, RAD  He is not exercising regularly.   He has no concerns.  Medications and allergies reviewed with patient and updated if appropriate.  Patient Active Problem List   Diagnosis Date Noted   Reactive airway disease 01/29/2020   Hiatal hernia 07/29/2019   Allergic rhinitis 07/25/2016   Chronic sinusitis 01/20/2016   Prediabetes 07/21/2015   Inguinal hernia 03/23/2014   Essential hypertension 04/01/2008   Aortic insufficiency 04/01/2008   SKIN CANCER, HX OF 04/01/2008   Hyperlipidemia 06/26/2006   BENIGN PROSTATIC HYPERTROPHY 06/26/2006    Current Outpatient Medications on File Prior to Visit  Medication Sig Dispense Refill   amLODipine (NORVASC) 2.5 MG tablet TAKE 1 TABLET BY MOUTH EVERY DAY 90 tablet 3   Cholecalciferol (VITAMIN D) 2000 UNITS tablet Take 2,000 Units by mouth daily.     fluticasone-salmeterol (ADVAIR) 100-50 MCG/ACT AEPB INHALE 1 PUFF TWICE A DAY 60 each 1   hydrochlorothiazide (HYDRODIURIL) 12.5 MG tablet TAKE 1 TABLET BY MOUTH EVERY DAY 90 tablet 0   losartan (COZAAR) 100 MG tablet TAKE 1 TABLET BY MOUTH EVERY DAY 90 tablet 1   Multiple Vitamin (MULTIVITAMIN WITH MINERALS) TABS tablet Take 1 tablet by mouth daily.     propranolol (INDERAL) 40 MG tablet TAKE 1/2 TABLET BY MOUTH TWICE A DAY 90 tablet 0   No current facility-administered medications on file prior to visit.    Past Medical History:  Diagnosis Date   Arthritis    Basal cell cancer     SKIN.Marland KitchenFACE AND HAND   Benign prostatic hypertrophy    Bronchitis    11/2013    Chest pain 1992   NEGATIVE CATH   Hyperglycemia    PMH of   Hyperlipidemia    Hypertension    Pneumonia 2009   treated as OP   Tachycardia    hx of per patient     Past Surgical History:  Procedure Laterality Date   BASAL CELL CARCINOMA EXCISION     X 3   cataract surgery      bilateral   COLONOSCOPY  2003   [facility]   HERNIA REPAIR  1972   Dr  Marylene Buerger   INGUINAL HERNIA REPAIR Right 04/06/2014   Procedure: REPAIR RIGHT INGUINAL HERNIA WITH MESH;  Surgeon: Jackolyn Confer, MD;  Location: [facility];  Service: General;  Laterality: Right;   INSERTION OF MESH Right 04/06/2014   Procedure: INSERTION OF MESH;  Surgeon: Jackolyn Confer, MD;  Location: [facility];  Service: General;  Laterality: Right;    Social History   Socioeconomic History   Marital status: Married    Spouse name: Not on file   Number of children: 2   Years of education: 18   Highest education level: Not on file  Occupational History   Occupation: [employer]  Tobacco Use   Smoking status: Never   Smokeless tobacco: Never  Substance and Sexual Activity   Alcohol use: No   Drug use: No  Sexual activity: Not Currently  Other Topics Concern   Not on file  Social History Narrative   Denies abuse and feels safe at home.   Social Determinants of Health   Financial Resource Strain: Low Risk    Difficulty of Paying Living Expenses: Not hard at all  Food Insecurity: No Food Insecurity   Worried About Charity fundraiser in the Last Year: Never true   [place] in the Last Year: Never true  Transportation Needs: No Transportation Needs   Lack of Transportation (Medical): No   Lack of Transportation (Non-Medical): No  Physical Activity: Sufficiently Active   Days of Exercise per Week: 5 days   Minutes of Exercise per Session: 30 min  Stress: No Stress Concern Present   Feeling of Stress : Not at all  Social  Connections: Socially Integrated   Frequency of Communication with Friends and Family: More than three times a week   Frequency of Social Gatherings with Friends and Family: More than three times a week   Attends Religious Services: More than 4 times per year   Active Member of [employer] or Organizations: Yes   Attends Music therapist: More than 4 times per year   Marital Status: Married    Family History  Problem Relation Age of Onset   Alcohol abuse Brother    Hypertension Mother    Heart disease Mother        AF   Transient ischemic attack Mother    Heart attack Father 31   Hypertension Father    Lung disease Father        ? [facility] visitor)   Cancer Neg Hx    Diabetes Neg Hx     Review of Systems  Constitutional:  Negative for chills and fever.       Low energy  Respiratory:  Positive for wheezing. Negative for cough and shortness of breath.   Cardiovascular:  Negative for chest pain, palpitations and leg swelling.  Genitourinary:  Positive for frequency. Negative for difficulty urinating.  Neurological:  Negative for light-headedness and headaches.  Psychiatric/Behavioral:  Positive for sleep disturbance.       Objective:   Vitals:   01/30/21 1109  BP: 140/78  Pulse: 63  Temp: 98 F (36.7 C)  SpO2: 97%   BP Readings from Last 3 Encounters:  01/30/21 140/78  07/29/20 130/76  01/29/20 130/80   Wt Readings from Last 3 Encounters:  01/30/21 160 lb 9.6 oz (72.8 kg)  07/29/20 159 lb (72.1 kg)  01/29/20 162 lb 6.4 oz (73.7 kg)   Body mass index is 25.15 kg/m.   Physical Exam    Constitutional: Appears well-developed and well-nourished. No distress.  HENT:  Head: Normocephalic and atraumatic.  Neck: Neck supple. No tracheal deviation present. No thyromegaly present.  No cervical lymphadenopathy Cardiovascular: Normal rate, regular rhythm and normal heart sounds.   No murmur heard. No carotid bruit .  No edema Pulmonary/Chest: Effort  normal and breath sounds normal. No respiratory distress. No has no wheezes. No rales.  Skin: Skin is warm and dry. Not diaphoretic.  Psychiatric: Normal mood and affect. Behavior is normal.      Assessment & Plan:    See Problem List for Assessment and Plan of chronic medical problems.

## 2021-01-29 NOTE — Patient Instructions (Addendum)
    Blood work was ordered.      Medications changes include :   none     Please followup in 6 months  

## 2021-01-30 ENCOUNTER — Encounter: Payer: Self-pay | Admitting: Internal Medicine

## 2021-01-30 ENCOUNTER — Other Ambulatory Visit: Payer: Self-pay

## 2021-01-30 ENCOUNTER — Ambulatory Visit (INDEPENDENT_AMBULATORY_CARE_PROVIDER_SITE_OTHER): Payer: Medicare Other | Admitting: Internal Medicine

## 2021-01-30 VITALS — BP 140/78 | HR 63 | Temp 98.0°F | Ht 67.0 in | Wt 160.6 lb

## 2021-01-30 DIAGNOSIS — R7303 Prediabetes: Secondary | ICD-10-CM

## 2021-01-30 DIAGNOSIS — E7849 Other hyperlipidemia: Secondary | ICD-10-CM

## 2021-01-30 DIAGNOSIS — I10 Essential (primary) hypertension: Secondary | ICD-10-CM | POA: Diagnosis not present

## 2021-01-30 DIAGNOSIS — J4530 Mild persistent asthma, uncomplicated: Secondary | ICD-10-CM | POA: Diagnosis not present

## 2021-01-30 LAB — CBC WITH DIFFERENTIAL/PLATELET
Basophils Absolute: 0.1 10*3/uL (ref 0.0–0.1)
Basophils Relative: 0.7 % (ref 0.0–3.0)
Eosinophils Absolute: 0.4 10*3/uL (ref 0.0–0.7)
Eosinophils Relative: 5.5 % — ABNORMAL HIGH (ref 0.0–5.0)
HCT: 44.8 % (ref 39.0–52.0)
Hemoglobin: 15.2 g/dL (ref 13.0–17.0)
Lymphocytes Relative: 18.4 % (ref 12.0–46.0)
Lymphs Abs: 1.4 10*3/uL (ref 0.7–4.0)
MCHC: 33.9 g/dL (ref 30.0–36.0)
MCV: 88.4 fl (ref 78.0–100.0)
Monocytes Absolute: 0.6 10*3/uL (ref 0.1–1.0)
Monocytes Relative: 8 % (ref 3.0–12.0)
Neutro Abs: 5.2 10*3/uL (ref 1.4–7.7)
Neutrophils Relative %: 67.4 % (ref 43.0–77.0)
Platelets: 283 10*3/uL (ref 150.0–400.0)
RBC: 5.06 Mil/uL (ref 4.22–5.81)
RDW: 14 % (ref 11.5–15.5)
WBC: 7.7 10*3/uL (ref 4.0–10.5)

## 2021-01-30 LAB — COMPREHENSIVE METABOLIC PANEL
ALT: 15 U/L (ref 0–53)
AST: 21 U/L (ref 0–37)
Albumin: 4.1 g/dL (ref 3.5–5.2)
Alkaline Phosphatase: 65 U/L (ref 39–117)
BUN: 16 mg/dL (ref 6–23)
CO2: 29 mEq/L (ref 19–32)
Calcium: 9.8 mg/dL (ref 8.4–10.5)
Chloride: 104 mEq/L (ref 96–112)
Creatinine, Ser: 1.1 mg/dL (ref 0.40–1.50)
GFR: 58.8 mL/min — ABNORMAL LOW (ref >60.00)
Glucose, Bld: 100 mg/dL — ABNORMAL HIGH (ref 70–99)
Potassium: 4 mEq/L (ref 3.5–5.1)
Sodium: 139 mEq/L (ref 135–145)
Total Bilirubin: 1.5 mg/dL — ABNORMAL HIGH (ref 0.2–1.2)
Total Protein: 7 g/dL (ref 6.0–8.3)

## 2021-01-30 LAB — HEMOGLOBIN A1C: Hgb A1c MFr Bld: 5.6 % (ref 4.6–6.5)

## 2021-01-30 NOTE — Assessment & Plan Note (Addendum)
Chronic Controlled ?  Asthma-never officially diagnosed Continue Advair 100-50 mcg per ACT twice daily

## 2021-01-30 NOTE — Assessment & Plan Note (Addendum)
Chronic Regular exercise and healthy diet encouraged Not currently on medication and given his age we will not start anything Lifestyle controlled

## 2021-01-30 NOTE — Assessment & Plan Note (Signed)
Chronic Check a1c Low sugar / carb diet Stressed regular exercise  

## 2021-01-30 NOTE — Assessment & Plan Note (Signed)
Chronic Blood pressure well controlled CMP Continue 2.5 mg daily, HCTZ 12.5 mg daily, losartan 100 mg daily, propranolol 20 mg twice daily

## 2021-03-06 ENCOUNTER — Encounter: Payer: Self-pay | Admitting: Internal Medicine

## 2021-03-07 NOTE — Progress Notes (Signed)
Subjective:    Patient ID: Christopher Richards, male    DOB: 1927/09/27, 88 y.o.   MRN: [id number]  This visit occurred during the SARS-CoV-2 public health emergency.  Safety protocols were in place, including screening questions prior to the visit, additional usage of staff PPE, and extensive cleaning of exam room while observing appropriate contact time as indicated for disinfecting solutions.    HPI He is here for an acute visit for cold symptoms.  His symptoms started about 2 weeks ago 2 weeks ago, but after further evaluation some of his symptoms started about a month ago.  He did take a COVID test and it was negative.  He is experiencing cough a few times a day - productive of clear, thick mucus.  He states some sinus pain/pressure for 3 weeks.  He has heard some wheezing a few times over the past several weeks.  He feels his sinus symptoms are probably more chronic and not acute.   Right leg > left leg swelling x > 1 month.  - the right leg started swelling first and now the left leg is swelling.  He is having DOE .  Last night and 1 night recently he had difficulty laying down at night because of the shortness of breath.  He had to go sit up in a chair for a while.  He is also noticed couple episodes of elevated blood pressure with exertion and increased shortness of breath with exertion over the past few weeks.  He denies chest pain or palpitations.  He denies fevers, headaches or lightheadedness.  Medications and allergies reviewed with patient and updated if appropriate.  Patient Active Problem List   Diagnosis Date Noted   Reactive airway disease 01/29/2020   Hiatal hernia 07/29/2019   Allergic rhinitis 07/25/2016   Chronic sinusitis 01/20/2016   Prediabetes 07/21/2015   Inguinal hernia 03/23/2014   Essential hypertension 04/01/2008   Aortic insufficiency 04/01/2008   SKIN CANCER, HX OF 04/01/2008   Hyperlipidemia 06/26/2006   BENIGN PROSTATIC HYPERTROPHY 06/26/2006     Current Outpatient Medications on File Prior to Visit  Medication Sig Dispense Refill   amLODipine (NORVASC) 2.5 MG tablet TAKE 1 TABLET BY MOUTH EVERY DAY 90 tablet 3   Cholecalciferol (VITAMIN D) 2000 UNITS tablet Take 2,000 Units by mouth daily.     fluticasone-salmeterol (ADVAIR) 100-50 MCG/ACT AEPB INHALE 1 PUFF TWICE A DAY 60 each 1   hydrochlorothiazide (HYDRODIURIL) 12.5 MG tablet TAKE 1 TABLET BY MOUTH EVERY DAY 90 tablet 0   losartan (COZAAR) 100 MG tablet TAKE 1 TABLET BY MOUTH EVERY DAY 90 tablet 1   Multiple Vitamin (MULTIVITAMIN WITH MINERALS) TABS tablet Take 1 tablet by mouth daily.     propranolol (INDERAL) 40 MG tablet TAKE 1/2 TABLET BY MOUTH TWICE A DAY 90 tablet 0   No current facility-administered medications on file prior to visit.    Past Medical History:  Diagnosis Date   Arthritis    Basal cell cancer    SKIN.Marland KitchenFACE AND HAND   Benign prostatic hypertrophy    Bronchitis    11/2013    Chest pain 1992   NEGATIVE CATH   Hyperglycemia    PMH of   Hyperlipidemia    Hypertension    Pneumonia 2009   treated as OP   Tachycardia    hx of per patient     Past Surgical History:  Procedure Laterality Date   BASAL CELL CARCINOMA EXCISION  X 3   cataract surgery      bilateral   COLONOSCOPY  2003   [facility]   HERNIA REPAIR  1972   Dr  [place] Right 04/06/2014   Procedure: REPAIR RIGHT INGUINAL HERNIA WITH MESH;  Surgeon: Jackolyn Confer, MD;  Location: [facility];  Service: General;  Laterality: Right;   INSERTION OF MESH Right 04/06/2014   Procedure: INSERTION OF MESH;  Surgeon: Jackolyn Confer, MD;  Location: [facility];  Service: General;  Laterality: Right;    Social History   Socioeconomic History   Marital status: Married    Spouse name: Not on file   Number of children: 2   Years of education: 18   Highest education level: Not on file  Occupational History   Occupation: [employer]  Tobacco Use   Smoking status:  Never   Smokeless tobacco: Never  Substance and Sexual Activity   Alcohol use: No   Drug use: No   Sexual activity: Not Currently  Other Topics Concern   Not on file  Social History Narrative   Denies abuse and feels safe at home.   Social Determinants of Health   Financial Resource Strain: Low Risk    Difficulty of Paying Living Expenses: Not hard at all  Food Insecurity: No Food Insecurity   Worried About Charity fundraiser in the Last Year: Never true   [place] in the Last Year: Never true  Transportation Needs: No Transportation Needs   Lack of Transportation (Medical): No   Lack of Transportation (Non-Medical): No  Physical Activity: Sufficiently Active   Days of Exercise per Week: 5 days   Minutes of Exercise per Session: 30 min  Stress: No Stress Concern Present   Feeling of Stress : Not at all  Social Connections: Socially Integrated   Frequency of Communication with Friends and Family: More than three times a week   Frequency of Social Gatherings with Friends and Family: More than three times a week   Attends Religious Services: More than 4 times per year   Active Member of [employer] or Organizations: Yes   Attends Music therapist: More than 4 times per year   Marital Status: Married    Family History  Problem Relation Age of Onset   Alcohol abuse Brother    Hypertension Mother    Heart disease Mother        AF   Transient ischemic attack Mother    Heart attack Father 40   Hypertension Father    Lung disease Father        ? [facility] visitor)   Cancer Neg Hx    Diabetes Neg Hx     Review of Systems  Constitutional:  Negative for fever.  HENT:  Positive for congestion and sinus pain (3 weeks). Negative for ear pain and sore throat.   Respiratory:  Positive for cough (productive of clear mucus), shortness of breath and wheezing (6 times in past 3 weeks).   Cardiovascular:  Positive for leg swelling. Negative for chest pain and  palpitations.  Gastrointestinal:  Negative for abdominal pain and nausea.       No GERD  Neurological:  Negative for light-headedness and headaches.      Objective:   Vitals:   03/08/21 1410  BP: 136/82  Pulse: 61  Temp: 98.2 F (36.8 C)  SpO2: 97%   BP Readings from Last 3 Encounters:  03/08/21 136/82  01/30/21 140/78  07/29/20 130/76   Wt Readings from Last 3 Encounters:  03/08/21 175 lb (79.4 kg)  01/30/21 160 lb 9.6 oz (72.8 kg)  07/29/20 159 lb (72.1 kg)   Body mass index is 27.41 kg/m.   Physical Exam    GENERAL APPEARANCE: Elderly male, NAD, some mild shortness of breath with speaking in full sentences EYES: conjunctiva clear, no icterus HENT: bilateral tympanic membranes and ear canals normal, oropharynx with no erythema or exudates, trachea midline, no cervical or supraclavicular lymphadenopathy LUNGS: good air entry bilaterally, dec BS b/l bases R> L, mild bibasilar crackles CARDIOVASCULAR: Normal Q6,B8 , 2/6 systolic murmur, 2+ pitting edema RLE > LLE SKIN: Warm, dry      Assessment & Plan:    See Problem List for Assessment and Plan of chronic medical problems.

## 2021-03-08 ENCOUNTER — Ambulatory Visit (INDEPENDENT_AMBULATORY_CARE_PROVIDER_SITE_OTHER): Payer: Medicare HMO | Admitting: Internal Medicine

## 2021-03-08 ENCOUNTER — Other Ambulatory Visit: Payer: Self-pay

## 2021-03-08 ENCOUNTER — Encounter: Payer: Self-pay | Admitting: Internal Medicine

## 2021-03-08 ENCOUNTER — Ambulatory Visit (INDEPENDENT_AMBULATORY_CARE_PROVIDER_SITE_OTHER): Payer: Medicare HMO

## 2021-03-08 VITALS — BP 136/82 | HR 61 | Temp 98.2°F | Ht 67.0 in | Wt 175.0 lb

## 2021-03-08 DIAGNOSIS — R0609 Other forms of dyspnea: Secondary | ICD-10-CM

## 2021-03-08 DIAGNOSIS — I351 Nonrheumatic aortic (valve) insufficiency: Secondary | ICD-10-CM

## 2021-03-08 DIAGNOSIS — I4819 Other persistent atrial fibrillation: Secondary | ICD-10-CM | POA: Insufficient documentation

## 2021-03-08 DIAGNOSIS — M7989 Other specified soft tissue disorders: Secondary | ICD-10-CM | POA: Insufficient documentation

## 2021-03-08 DIAGNOSIS — J90 Pleural effusion, not elsewhere classified: Secondary | ICD-10-CM | POA: Diagnosis not present

## 2021-03-08 DIAGNOSIS — I4891 Unspecified atrial fibrillation: Secondary | ICD-10-CM

## 2021-03-08 DIAGNOSIS — R0600 Dyspnea, unspecified: Secondary | ICD-10-CM | POA: Diagnosis not present

## 2021-03-08 DIAGNOSIS — R059 Cough, unspecified: Secondary | ICD-10-CM | POA: Diagnosis not present

## 2021-03-08 LAB — CBC WITH DIFFERENTIAL/PLATELET
Basophils Absolute: 0 10*3/uL (ref 0.0–0.1)
Basophils Relative: 0.4 % (ref 0.0–3.0)
Eosinophils Absolute: 0.5 10*3/uL (ref 0.0–0.7)
Eosinophils Relative: 5.5 % — ABNORMAL HIGH (ref 0.0–5.0)
HCT: 42.4 % (ref 39.0–52.0)
Hemoglobin: 14.4 g/dL (ref 13.0–17.0)
Lymphocytes Relative: 14.3 % (ref 12.0–46.0)
Lymphs Abs: 1.4 10*3/uL (ref 0.7–4.0)
MCHC: 33.9 g/dL (ref 30.0–36.0)
MCV: 89.4 fl (ref 78.0–100.0)
Monocytes Absolute: 0.7 10*3/uL (ref 0.1–1.0)
Monocytes Relative: 7.5 % (ref 3.0–12.0)
Neutro Abs: 7.1 10*3/uL (ref 1.4–7.7)
Neutrophils Relative %: 72.3 % (ref 43.0–77.0)
Platelets: 257 10*3/uL (ref 150.0–400.0)
RBC: 4.75 Mil/uL (ref 4.22–5.81)
RDW: 14.1 % (ref 11.5–15.5)
WBC: 9.9 10*3/uL (ref 4.0–10.5)

## 2021-03-08 LAB — COMPREHENSIVE METABOLIC PANEL
ALT: 18 U/L (ref 0–53)
AST: 19 U/L (ref 0–37)
Albumin: 4.2 g/dL (ref 3.5–5.2)
Alkaline Phosphatase: 66 U/L (ref 39–117)
BUN: 21 mg/dL (ref 6–23)
CO2: 26 mEq/L (ref 19–32)
Calcium: 9.8 mg/dL (ref 8.4–10.5)
Chloride: 106 mEq/L (ref 96–112)
Creatinine, Ser: 1.25 mg/dL (ref 0.40–1.50)
GFR: 50.4 mL/min — ABNORMAL LOW (ref >60.00)
Glucose, Bld: 96 mg/dL (ref 70–99)
Potassium: 4.8 mEq/L (ref 3.5–5.1)
Sodium: 140 mEq/L (ref 135–145)
Total Bilirubin: 1.3 mg/dL — ABNORMAL HIGH (ref 0.2–1.2)
Total Protein: 7.3 g/dL (ref 6.0–8.3)

## 2021-03-08 LAB — BRAIN NATRIURETIC PEPTIDE: Pro B Natriuretic peptide (BNP): 684 pg/mL — ABNORMAL HIGH (ref 0.0–100.0)

## 2021-03-08 LAB — TSH: TSH: 1.19 u[IU]/mL (ref 0.35–5.50)

## 2021-03-08 MED ORDER — APIXABAN 5 MG PO TABS: 5.0000 mg | ORAL_TABLET | Freq: Two times a day (BID) | ORAL | 5 refills | Status: DC

## 2021-03-08 MED ORDER — FUROSEMIDE 20 MG PO TABS: ORAL_TABLET | ORAL | 5 refills | Status: DC

## 2021-03-08 NOTE — Assessment & Plan Note (Addendum)
Acute Experiencing dyspnea on exertion for a few weeks-hard to really know how long, but recently has been worse and he has had a couple of episodes of orthopnea Likely related to new onset A. fib with element of heart failure Deferred emergency room-he is the primary caregiver of his wife Chest x-ray CBC, CMP, BNP, TSH Stop hydrochlorothiazide Start Lasix-40 mg daily x2 days and then 20 mg daily Follow-up with me in 2 days Urgent referral to cardiology Echocardiogram ordered  Discussed with him that if his symptoms worsen he needs to go to the emergency room

## 2021-03-08 NOTE — Progress Notes (Signed)
CARDIOLOGY CONSULT NOTE       Patient ID: Christopher Richards MRN: [id number] DOB/AGE: 1927/09/27 88 y.o.  Admit date: (Not on file) Referring Physician: Quay Burow Primary Physician: Binnie Rail, MD Primary Cardiologist: New Reason for Consultation: Afib/Dyspnea  Active Problems:   * No active hospital problems. *   HPI:  88 y.o. referred by Dr Quay Burow for dyspnea and afib.  Seen by primary 03/08/21 Had URI symptoms with cough for 2 weeks COVID negative Also had bilateral LE edema right > left with dyspnea with PND and orthopnea  CXR confirmed CHF with CE, diffuse bilateral interstitial opacities and small bilateral effusions likely edema   He has no prior history of CAD/MI/CHF or arrhythmia   Echo from 03/12/16 showed mild LVH with EF 55-60% and mild AR with normal estimated PA pressure   Appears he was started on eliquis and lasix. Was already on Norvasc, Inderal and Cozaar for HTN   ECG showed afib rate 52 with no acute ST changes   He has noted more edema since first week in December He does not have palpitations and never noted his afib He has a chronic UE essential tremor no Parkinsons  No bleeding issues  His daughter Bethena Roys is with him and works for [employer] and looks out for him well and frequently will work from his home He still drives and is widowed   ROS All other systems reviewed and negative except as noted above  Past Medical History:  Diagnosis Date   Arthritis    Basal cell cancer    SKIN.Marland KitchenFACE AND HAND   Benign prostatic hypertrophy    Bronchitis    11/2013    Chest pain 1992   NEGATIVE CATH   Hyperglycemia    PMH of   Hyperlipidemia    Hypertension    Pneumonia 2009   treated as OP   Tachycardia    hx of per patient     Family History  Problem Relation Age of Onset   Alcohol abuse Brother    Hypertension Mother    Heart disease Mother        AF   Transient ischemic attack Mother    Heart attack Father 70   Hypertension Father    Lung disease  Father        ? [facility])   Cancer Neg Hx    Diabetes Neg Hx     Social History   Socioeconomic History   Marital status: Married    Spouse name: Not on file   Number of children: 2   Years of education: 18   Highest education level: Not on file  Occupational History   Occupation: [employer]  Tobacco Use   Smoking status: Never   Smokeless tobacco: Never  Substance and Sexual Activity   Alcohol use: No   Drug use: No   Sexual activity: Not Currently  Other Topics Concern   Not on file  Social History Narrative   Denies abuse and feels safe at home.   Social Determinants of Health   Financial Resource Strain: Low Risk    Difficulty of Paying Living Expenses: Not hard at all  Food Insecurity: No Food Insecurity   Worried About Charity fundraiser in the Last Year: Never true   [place] in the Last Year: Never true  Transportation Needs: No Transportation Needs   Lack of Transportation (Medical): No   Lack of Transportation (Non-Medical): No  Physical Activity:  Sufficiently Active   Days of Exercise per Week: 5 days   Minutes of Exercise per Session: 30 min  Stress: No Stress Concern Present   Feeling of Stress : Not at all  Social Connections: Socially Integrated   Frequency of Communication with Friends and Family: More than three times a week   Frequency of Social Gatherings with Friends and Family: More than three times a week   Attends Religious Services: More than 4 times per year   Active Member of Clubs or Organizations: Yes   Attends Music therapist: More than 4 times per year   Marital Status: Married  Human resources officer Violence: Not on file    Past Surgical History:  Procedure Laterality Date   BASAL CELL CARCINOMA EXCISION     X 3   cataract surgery      bilateral   COLONOSCOPY  2003   [facility]   HERNIA REPAIR  1972   Dr  Marylene Buerger   INGUINAL HERNIA REPAIR Right 04/06/2014   Procedure: REPAIR RIGHT INGUINAL  HERNIA WITH MESH;  Surgeon: Jackolyn Confer, MD;  Location: [facility];  Service: General;  Laterality: Right;   INSERTION OF MESH Right 04/06/2014   Procedure: INSERTION OF MESH;  Surgeon: Jackolyn Confer, MD;  Location: [facility];  Service: General;  Laterality: Right;      Current Outpatient Medications:    amLODipine (NORVASC) 2.5 MG tablet, TAKE 1 TABLET BY MOUTH EVERY DAY, Disp: 90 tablet, Rfl: 3   apixaban (ELIQUIS) 5 MG TABS tablet, Take 1 tablet (5 mg total) by mouth 2 (two) times daily., Disp: 60 tablet, Rfl: 5   Cholecalciferol (VITAMIN D) 2000 UNITS tablet, Take 2,000 Units by mouth daily., Disp: , Rfl:    fluticasone-salmeterol (ADVAIR) 100-50 MCG/ACT AEPB, INHALE 1 PUFF TWICE A DAY, Disp: 60 each, Rfl: 1   furosemide (LASIX) 20 MG tablet, Take 2 tabs daily x 2 days and then 1 tab daily, Disp: 30 tablet, Rfl: 5   losartan (COZAAR) 100 MG tablet, TAKE 1 TABLET BY MOUTH EVERY DAY, Disp: 90 tablet, Rfl: 1   Multiple Vitamin (MULTIVITAMIN WITH MINERALS) TABS tablet, Take 1 tablet by mouth daily., Disp: , Rfl:    propranolol (INDERAL) 40 MG tablet, TAKE 1/2 TABLET BY MOUTH TWICE A DAY, Disp: 90 tablet, Rfl: 0    Physical Exam: There were no vitals taken for this visit.   Frail elderly male Basilar rales SEM Abdomen benign  R> L LE edema   Labs:   Lab Results  Component Value Date   WBC 9.9 03/08/2021   HGB 14.4 03/08/2021   HCT 42.4 03/08/2021   MCV 89.4 03/08/2021   PLT 257.0 03/08/2021    Recent Labs  Lab 03/08/21 1528  NA 140  K 4.8  CL 106  CO2 26  BUN 21  CREATININE 1.25  CALCIUM 9.8  PROT 7.3  BILITOT 1.3*  ALKPHOS 66  ALT 18  AST 19  GLUCOSE 96   No results found for: CKTOTAL, CKMB, CKMBINDEX, TROPONINI  Lab Results  Component Value Date   CHOL 175 01/29/2020   CHOL 172 07/30/2019   CHOL 180 01/29/2019   Lab Results  Component Value Date   HDL 37.00 (L) 01/29/2020   HDL 35.10 (L) 07/30/2019   HDL 44.50 01/29/2019   Lab Results  Component  Value Date   LDLCALC 116 (H) 01/29/2020   LDLCALC 118 (H) 07/30/2019   LDLCALC 118 (H) 01/29/2019   Lab Results  Component Value Date   TRIG 110.0 01/29/2020   TRIG 94.0 07/30/2019   TRIG 90.0 01/29/2019   Lab Results  Component Value Date   CHOLHDL 5 01/29/2020   CHOLHDL 5 07/30/2019   CHOLHDL 4 01/29/2019   No results found for: LDLDIRECT    Radiology: DG Chest 2 View  Result Date: 03/08/2021 CLINICAL DATA:  Cough, dyspnea on exertion, bilateral leg swelling EXAM: CHEST - 2 VIEW COMPARISON:  06/11/2011 FINDINGS: Cardiomegaly. Diffuse bilateral interstitial opacity and small bilateral pleural effusions. Disc degenerative disease of the thoracic spine. IMPRESSION: Cardiomegaly with diffuse bilateral interstitial pulmonary opacity and small bilateral pleural effusions, likely edema. No focal airspace opacity. Electronically Signed   By: Delanna Ahmadi M.D.   On: 03/08/2021 15:33    EKG: see HPI   ASSESSMENT AND PLAN:   AFib:  On inderal and now eliquis started 03/08/20 May need to lower inderal dose as he is a bit slow.  CHF:  Needs echo ? Related to arrhythmia Prevoius EF normal in 2018 with mild AR Given CE on CXR and presentation suspect acute systolic CHF.  Lasix added by primary Already on inderal and cozaar will adjust meds based on echo reslults If EF is down will likely stop norvasc and cozaar and initiate Entresto   BMET 2 weeks Echo    F/U in 4-6 weeks    Signed: Jenkins Rouge 03/09/2021, 9:18 AM

## 2021-03-08 NOTE — Assessment & Plan Note (Signed)
Acute EKG today shows atrial fibrillation with slow ventricular response 52 bpm, which is new compared to his previous EKG from 2018, which was NSR at 68 bpm. Rate is controlled and blood pressure is good so will not adjust any medication right now Start Eliquis 5 mg twice daily He does have some element of fluid overload/heart failure Deferred going to the emergency room-he is the primary caregiver for his wife echocardiogram ordered Chest x-ray ordered CBC, CMP, TSH, BNP Urgent referral to cardiology

## 2021-03-08 NOTE — Assessment & Plan Note (Signed)
Acute Bilateral 2+ pitting lower extremity edema-right leg worse than left This started about a month ago or little prior to that Does have some dyspnea on exertion,, new onset A. fib Deferred evaluation in the emergency room since he is the primary caregiver of his wife Stat venous ultrasound to rule out DVT Chest x-ray CBC, CMP, BNP, TSH Stop hydrochlorothiazide Start furosemide 40 mg daily x2 days and then 20 mg daily To follow-up with me in 2 days Referral ordered for cardiology

## 2021-03-08 NOTE — Assessment & Plan Note (Signed)
Chronic Last echo was 2018 and his aortic insufficiency was mild at that time Murmur sounds mild and unchanged Having heart failure symptoms will recheck echocardiogram

## 2021-03-08 NOTE — Patient Instructions (Addendum)
° °  EKG today.  It does show afib and you do have some fluid overload.     Chest xray and Blood work was ordered.     Medications changes include :   stop hydrochlorothiazide.  Start lasix as prescribed, start eliquis 5 mg twice daily    Your prescription(s) have been submitted to your pharmacy. Please take as directed and contact our office if you believe you are having problem(s) with the medication(s).  A referral was ordered for cardiology.     An ultrasound of your legs was ordered.  An Ultrasound of your heart was ordered.    Follow up with me on Friday

## 2021-03-09 ENCOUNTER — Encounter: Payer: Self-pay | Admitting: Cardiovascular Disease

## 2021-03-09 ENCOUNTER — Ambulatory Visit: Payer: Medicare HMO | Admitting: Cardiovascular Disease

## 2021-03-09 VITALS — BP 144/84 | HR 65 | Ht 67.0 in | Wt 171.0 lb

## 2021-03-09 DIAGNOSIS — I480 Paroxysmal atrial fibrillation: Secondary | ICD-10-CM

## 2021-03-09 DIAGNOSIS — I509 Heart failure, unspecified: Secondary | ICD-10-CM

## 2021-03-09 NOTE — Patient Instructions (Addendum)
Medication Instructions:  Your physician recommends that you continue on your current medications as directed. Please refer to the Current Medication list given to you today.  *If you need a refill on your cardiac medications before your next appointment, please call your pharmacy*  Lab Work: Your physician recommends that you return for lab work in: 2 weeks for BMET  If you have labs (blood work) drawn today and your tests are completely normal, you will receive your results only by: [place] (if you have MyChart) OR A paper copy in the mail If you have any lab test that is abnormal or we need to change your treatment, we will call you to review the results.  Testing/Procedures: Your physician has requested that you have an echocardiogram. Echocardiography is a painless test that uses sound waves to create images of your heart. It provides your doctor with information about the size and shape of your heart and how well your hearts chambers and valves are working. This procedure takes approximately one hour. There are no restrictions for this procedure.  Follow-Up: At [facility], you and your health needs are our priority.  As part of our continuing mission to provide you with exceptional heart care, we have created designated Provider Care Teams.  These Care Teams include your primary Cardiologist (physician) and Advanced Practice Providers (APPs -  Physician Assistants and Nurse Practitioners) who all work together to provide you with the care you need, when you need it.  We recommend signing up for the patient portal called "MyChart".  Sign up information is provided on this After Visit Summary.  MyChart is used to connect with patients for Virtual Visits (Telemedicine).  Patients are able to view lab/test results, encounter notes, upcoming appointments, etc.  Non-urgent messages can be sent to your provider as well.   To learn more about what you can do with MyChart, go to  [email or web address].    Your next appointment:   4 to 6 weeks  The format for your next appointment:   In Person  Provider:   Jenkins Rouge, MD {

## 2021-03-10 ENCOUNTER — Ambulatory Visit (INDEPENDENT_AMBULATORY_CARE_PROVIDER_SITE_OTHER): Payer: Medicare HMO | Admitting: Internal Medicine

## 2021-03-10 ENCOUNTER — Ambulatory Visit (HOSPITAL_COMMUNITY)
Admission: RE | Admit: 2021-03-10 | Discharge: 2021-03-10 | Disposition: A | Discharge status: Home / Self Care | Payer: Medicare HMO | Source: Ambulatory Visit | Attending: Cardiology | Admitting: Cardiology

## 2021-03-10 ENCOUNTER — Encounter: Payer: Self-pay | Admitting: Internal Medicine

## 2021-03-10 ENCOUNTER — Telehealth: Payer: Self-pay | Admitting: Internal Medicine

## 2021-03-10 ENCOUNTER — Other Ambulatory Visit: Payer: Self-pay

## 2021-03-10 VITALS — BP 132/62 | HR 61 | Temp 97.9°F | Ht 67.0 in | Wt 162.0 lb

## 2021-03-10 DIAGNOSIS — I10 Essential (primary) hypertension: Secondary | ICD-10-CM

## 2021-03-10 DIAGNOSIS — M7989 Other specified soft tissue disorders: Secondary | ICD-10-CM

## 2021-03-10 DIAGNOSIS — R0609 Other forms of dyspnea: Secondary | ICD-10-CM | POA: Diagnosis not present

## 2021-03-10 DIAGNOSIS — I4891 Unspecified atrial fibrillation: Secondary | ICD-10-CM | POA: Diagnosis not present

## 2021-03-10 LAB — BASIC METABOLIC PANEL
BUN: 19 mg/dL (ref 6–23)
CO2: 29 mEq/L (ref 19–32)
Calcium: 9.2 mg/dL (ref 8.4–10.5)
Chloride: 101 mEq/L (ref 96–112)
Creatinine, Ser: 1.3 mg/dL (ref 0.40–1.50)
GFR: 48.08 mL/min — ABNORMAL LOW (ref >60.00)
Glucose, Bld: 82 mg/dL (ref 70–99)
Potassium: 3.6 mEq/L (ref 3.5–5.1)
Sodium: 138 mEq/L (ref 135–145)

## 2021-03-10 LAB — BRAIN NATRIURETIC PEPTIDE: Pro B Natriuretic peptide (BNP): 327 pg/mL — ABNORMAL HIGH (ref 0.0–100.0)

## 2021-03-10 NOTE — Telephone Encounter (Signed)
Patient is negative for DVT in both legs

## 2021-03-10 NOTE — Assessment & Plan Note (Signed)
Improved Related to heart failure likely secondary to atrial fibrillation Ultrasound of lower extremities today was negative for DVT

## 2021-03-10 NOTE — Assessment & Plan Note (Signed)
Diagnosed 2 days ago by EKG On exam he has a slightly regular rhythm, but it is slow and still could be in A. fib On Eliquis 5 mg twice daily Currently on propranolol 20 mg twice daily and amlodipine 2.5 mg daily We will get an echocardiogram and Dr. Johnsie Cancel will adjust his medication accordingly Advised him to monitor his blood pressure and heart rate at home

## 2021-03-10 NOTE — Telephone Encounter (Signed)
Pt informed

## 2021-03-10 NOTE — Assessment & Plan Note (Signed)
Improved Likely heart failure secondary to atrial fibrillation Has seen cardiology yesterday Has echocardiogram ordered Did well with Lasix x3 days at 40 mg-weight is down 7 pounds and swelling is much better.  Shortness of breath is better and he is doing more Still has a fair amount of swelling-will take 40 mg today and then go down to 20 mg of Lasix daily BMP, BNP today Him and his daughters will monitor his leg swelling, shortness of breath and weight at home-we can adjust Lasix dose as needed

## 2021-03-10 NOTE — Patient Instructions (Signed)
  Blood work was ordered.     Medications changes include :   none     

## 2021-03-10 NOTE — Progress Notes (Signed)
Subjective:    Patient ID: Christopher Richards, male    DOB: May 06, 1927, 88 y.o.   MRN: [id number]  This visit occurred during the SARS-CoV-2 public health emergency.  Safety protocols were in place, including screening questions prior to the visit, additional usage of staff PPE, and extensive cleaning of exam room while observing appropriate contact time as indicated for disinfecting solutions.     HPI The patient is here for follow up from 2 days ago-I diagnosed him with new onset A. fib and fluid overload-probable CHF.  Start him on Lasix and Eliquis.  He was able to see cardiology yesterday.  He is here with his 2 daughters.  To have a lower extremity ultrasound and echocardiogram.  Ultrasound of lower extremities done today-negative for DVT.   BP better. Swelling better.    Still weak.  Breathing is better.  Able to do more things.  He has lost 7 pounds since he was here 2 days ago, on our scale.  He has taken Lasix 40 mg daily x2 days.  He did not take any today because he was coming here.     Medications and allergies reviewed with patient and updated if appropriate.  Patient Active Problem List   Diagnosis Date Noted   DOE (dyspnea on exertion) 03/08/2021   Leg swelling 03/08/2021   New onset a-fib ([place]) 03/08/2021   Reactive airway disease 01/29/2020   Hiatal hernia 07/29/2019   Allergic rhinitis 07/25/2016   Chronic sinusitis 01/20/2016   Prediabetes 07/21/2015   Inguinal hernia 03/23/2014   Essential hypertension 04/01/2008   Aortic insufficiency 04/01/2008   SKIN CANCER, HX OF 04/01/2008   Hyperlipidemia 06/26/2006   BENIGN PROSTATIC HYPERTROPHY 06/26/2006    Current Outpatient Medications on File Prior to Visit  Medication Sig Dispense Refill   amLODipine (NORVASC) 2.5 MG tablet TAKE 1 TABLET BY MOUTH EVERY DAY 90 tablet 3   apixaban (ELIQUIS) 5 MG TABS tablet Take 1 tablet (5 mg total) by mouth 2 (two) times daily. 60 tablet 5   Cholecalciferol (VITAMIN D)  2000 UNITS tablet Take 2,000 Units by mouth daily.     fluticasone-salmeterol (ADVAIR) 100-50 MCG/ACT AEPB INHALE 1 PUFF TWICE A DAY 60 each 1   furosemide (LASIX) 20 MG tablet Take 2 tabs daily x 2 days and then 1 tab daily 30 tablet 5   losartan (COZAAR) 100 MG tablet TAKE 1 TABLET BY MOUTH EVERY DAY 90 tablet 1   Multiple Vitamin (MULTIVITAMIN WITH MINERALS) TABS tablet Take 1 tablet by mouth daily.     propranolol (INDERAL) 40 MG tablet TAKE 1/2 TABLET BY MOUTH TWICE A DAY 90 tablet 0   No current facility-administered medications on file prior to visit.    Past Medical History:  Diagnosis Date   Arthritis    Basal cell cancer    SKIN.Marland KitchenFACE AND HAND   Benign prostatic hypertrophy    Bronchitis    11/2013    Chest pain 1992   NEGATIVE CATH   Hyperglycemia    PMH of   Hyperlipidemia    Hypertension    Pneumonia 2009   treated as OP   Tachycardia    hx of per patient     Past Surgical History:  Procedure Laterality Date   BASAL CELL CARCINOMA EXCISION     X 3   cataract surgery      bilateral   COLONOSCOPY  2003   [place]  Dr  Marylene Buerger   INGUINAL HERNIA REPAIR Right 04/06/2014   Procedure: REPAIR RIGHT INGUINAL HERNIA WITH MESH;  Surgeon: Jackolyn Confer, MD;  Location: [facility];  Service: General;  Laterality: Right;   INSERTION OF MESH Right 04/06/2014   Procedure: INSERTION OF MESH;  Surgeon: Jackolyn Confer, MD;  Location: [facility];  Service: General;  Laterality: Right;    Social History   Socioeconomic History   Marital status: Married    Spouse name: Not on file   Number of children: 2   Years of education: 18   Highest education level: Not on file  Occupational History   Occupation: [employer]  Tobacco Use   Smoking status: Never   Smokeless tobacco: Never  Substance and Sexual Activity   Alcohol use: No   Drug use: No   Sexual activity: Not Currently  Other Topics Concern   Not on file  Social History Narrative   Denies  abuse and feels safe at home.   Social Determinants of Health   Financial Resource Strain: Low Risk    Difficulty of Paying Living Expenses: Not hard at all  Food Insecurity: No Food Insecurity   Worried About Charity fundraiser in the Last Year: Never true   [place] in the Last Year: Never true  Transportation Needs: No Transportation Needs   Lack of Transportation (Medical): No   Lack of Transportation (Non-Medical): No  Physical Activity: Sufficiently Active   Days of Exercise per Week: 5 days   Minutes of Exercise per Session: 30 min  Stress: No Stress Concern Present   Feeling of Stress : Not at all  Social Connections: Socially Integrated   Frequency of Communication with Friends and Family: More than three times a week   Frequency of Social Gatherings with Friends and Family: More than three times a week   Attends Religious Services: More than 4 times per year   Active Member of [employer] or Organizations: Yes   Attends Music therapist: More than 4 times per year   Marital Status: Married    Family History  Problem Relation Age of Onset   Alcohol abuse Brother    Hypertension Mother    Heart disease Mother        AF   Transient ischemic attack Mother    Heart attack Father 21   Hypertension Father    Lung disease Father        ? [facility] visitor)   Cancer Neg Hx    Diabetes Neg Hx     Review of Systems  Respiratory:  Positive for shortness of breath (better). Negative for cough and wheezing.   Cardiovascular:  Positive for leg swelling. Negative for chest pain and palpitations.  Neurological:  Negative for dizziness and light-headedness.      Objective:   Vitals:   03/10/21 1446  BP: 132/62  Pulse: 61  Temp: 97.9 F (36.6 C)  SpO2: 97%   BP Readings from Last 3 Encounters:  03/10/21 132/62  03/09/21 (!) 144/84  03/08/21 136/82   Wt Readings from Last 3 Encounters:  03/10/21 162 lb (73.5 kg)  03/09/21 171 lb (77.6 kg)   03/08/21 175 lb (79.4 kg)   Body mass index is 25.37 kg/m.   Physical Exam    Constitutional: Appears well-developed and well-nourished. No distress.  HENT:  Head: Normocephalic and atraumatic.  Cardiovascular: Normal rate, regular rhythm and normal heart sounds.   No murmur heard. No  carotid bruit .  2+ pitting bilateral lower extremity edema-much better compared to 2 days ago Pulmonary/Chest: Effort normal and breath sounds normal. No respiratory distress. No has no wheezes. No rales.  Skin: Skin is warm and dry. Not diaphoretic.  Psychiatric: Normal mood and affect. Behavior is normal.      Assessment & Plan:    See Problem List for Assessment and Plan of chronic medical problems.

## 2021-03-10 NOTE — Assessment & Plan Note (Signed)
Chronic Blood pressure well controlled Continue medications-amlodipine 2.5 mg daily, losartan 100 mg daily, propranolol 20 mg twice daily for now-after echocardiogram cardiology will adjust medication

## 2021-03-12 ENCOUNTER — Encounter: Payer: Self-pay | Admitting: Cardiovascular Disease

## 2021-03-12 ENCOUNTER — Encounter: Payer: Self-pay | Admitting: Internal Medicine

## 2021-03-13 ENCOUNTER — Encounter: Payer: Self-pay | Admitting: Cardiovascular Disease

## 2021-03-13 MED ORDER — HYDRALAZINE HCL 25 MG PO TABS: 25.0000 mg | ORAL_TABLET | Freq: Three times a day (TID) | ORAL | 2 refills | Status: DC

## 2021-03-14 ENCOUNTER — Other Ambulatory Visit: Payer: Self-pay | Admitting: Internal Medicine

## 2021-03-15 ENCOUNTER — Other Ambulatory Visit: Payer: Self-pay | Admitting: Internal Medicine

## 2021-03-16 ENCOUNTER — Other Ambulatory Visit: Payer: Self-pay

## 2021-03-16 ENCOUNTER — Ambulatory Visit (HOSPITAL_COMMUNITY): Payer: Medicare HMO | Attending: Cardiology

## 2021-03-16 DIAGNOSIS — I509 Heart failure, unspecified: Secondary | ICD-10-CM | POA: Insufficient documentation

## 2021-03-16 LAB — ECHOCARDIOGRAM COMPLETE
AR max vel: 2.32 cm2
AV Area VTI: 2.29 cm2
AV Area mean vel: 2.23 cm2
AV Mean grad: 2 mmHg
AV Peak grad: 4.1 mmHg
Ao pk vel: 1.01 m/s
Calc EF: 68.9 %
MV VTI: 0.3 cm2
P 1/2 time: 715 msec
S' Lateral: 2.5 cm
Single Plane A2C EF: 58.4 %
Single Plane A4C EF: 75.3 %

## 2021-03-20 ENCOUNTER — Other Ambulatory Visit: Payer: Medicare HMO | Admitting: *Deleted

## 2021-03-20 ENCOUNTER — Other Ambulatory Visit (HOSPITAL_COMMUNITY): Payer: Medicare HMO

## 2021-03-20 ENCOUNTER — Encounter: Payer: Self-pay | Admitting: Internal Medicine

## 2021-03-20 ENCOUNTER — Other Ambulatory Visit: Payer: Self-pay

## 2021-03-20 DIAGNOSIS — I509 Heart failure, unspecified: Secondary | ICD-10-CM | POA: Diagnosis not present

## 2021-03-20 LAB — BASIC METABOLIC PANEL
BUN/Creatinine Ratio: 16 (ref 10–24)
BUN: 21 mg/dL (ref 10–36)
CO2: 25 mmol/L (ref 20–29)
Calcium: 10.2 mg/dL (ref 8.6–10.2)
Chloride: 100 mmol/L (ref 96–106)
Creatinine, Ser: 1.34 mg/dL — ABNORMAL HIGH (ref 0.76–1.27)
Glucose: 109 mg/dL — ABNORMAL HIGH (ref 70–99)
Potassium: 4.5 mmol/L (ref 3.5–5.2)
Sodium: 140 mmol/L (ref 134–144)
eGFR: 50 mL/min/{1.73_m2} — ABNORMAL LOW (ref >59)

## 2021-03-21 ENCOUNTER — Telehealth: Payer: Self-pay

## 2021-03-21 DIAGNOSIS — R0609 Other forms of dyspnea: Secondary | ICD-10-CM

## 2021-03-21 DIAGNOSIS — M7989 Other specified soft tissue disorders: Secondary | ICD-10-CM

## 2021-03-21 DIAGNOSIS — I509 Heart failure, unspecified: Secondary | ICD-10-CM

## 2021-03-21 NOTE — Telephone Encounter (Signed)
The patient has been notified of the result and verbalized understanding.  All questions (if any) were answered. Michaelyn Barter, RN 03/21/2021 1:58 PM    Patient coming in on 04/18/20 for lab work.

## 2021-03-21 NOTE — Telephone Encounter (Signed)
-----   Message from Josue Hector, MD sent at 03/21/2021  8:12 AM EST ----- Cr up a bit on lasix but not too bad BNP/BMET in 4 weeks

## 2021-04-18 ENCOUNTER — Other Ambulatory Visit: Payer: Medicare HMO | Admitting: *Deleted

## 2021-04-18 ENCOUNTER — Other Ambulatory Visit: Payer: Self-pay

## 2021-04-18 DIAGNOSIS — I509 Heart failure, unspecified: Secondary | ICD-10-CM | POA: Diagnosis not present

## 2021-04-18 DIAGNOSIS — R0609 Other forms of dyspnea: Secondary | ICD-10-CM

## 2021-04-18 DIAGNOSIS — M7989 Other specified soft tissue disorders: Secondary | ICD-10-CM | POA: Diagnosis not present

## 2021-04-19 LAB — BASIC METABOLIC PANEL
BUN/Creatinine Ratio: 19 (ref 10–24)
BUN: 26 mg/dL (ref 10–36)
CO2: 24 mmol/L (ref 20–29)
Calcium: 9.4 mg/dL (ref 8.6–10.2)
Chloride: 104 mmol/L (ref 96–106)
Creatinine, Ser: 1.36 mg/dL — ABNORMAL HIGH (ref 0.76–1.27)
Glucose: 94 mg/dL (ref 70–99)
Potassium: 4.3 mmol/L (ref 3.5–5.2)
Sodium: 140 mmol/L (ref 134–144)
eGFR: 49 mL/min/{1.73_m2} — ABNORMAL LOW (ref >59)

## 2021-04-19 LAB — PRO B NATRIURETIC PEPTIDE: NT-Pro BNP: 2089 pg/mL — ABNORMAL HIGH (ref 0–486)

## 2021-04-25 ENCOUNTER — Encounter: Payer: Self-pay | Admitting: Cardiovascular Disease

## 2021-04-29 ENCOUNTER — Other Ambulatory Visit: Payer: Self-pay | Admitting: Internal Medicine

## 2021-05-03 NOTE — Progress Notes (Signed)
?Cardiology Office Note:   ? ?Date:  05/04/2021  ? ?ID:  Christopher Richards, DOB Feb 03, 1928, MRN [id number] ? ?PCP:  Binnie Rail, MD ?  ?[facility] Providers ?Cardiologist:  Jenkins Rouge, MD    ? ?Referring MD: Binnie Rail, MD  ? ?Chief Complaint: follow-up edema, atrial fibrillation ? ?History of Present Illness:   ? ?Christopher Richards is a 88 y.o. male with a hx of HTN, aortic insufficiency, atrial fibrillation,  ? ?He was referred by Dr. Quay Burow and established care with Dr. Johnsie Cancel on 03/09/21 for new onset atrial fibrillation. He had URI symptoms with cough for 2 weeks, LE edema right > left with dyspnea, PND and orthopnea. CXR confirmed CHF with CE, diffuse bilateral interstitial opacities and small bilateral effusions, likely edema. No prior history of CAD/MI/CHF or arrhythmia.  ? ?Echo 03/16/21 revealed normal LVEF 60-65%, mild LVH of basal-septal segment, unable to determine diastolic dysfunction due to a fib, mild bilateral atrial dilation, mild MR, mild Ai, aortic valve sclerosis/calcification without evidence of aortic stenosis ? ?Today, he is here with his daughter who is a Marine scientist.  He reports 15 pound weight loss with initiation of Lasix 6 weeks ago.  Has bilateral lower extremity edema, right greater than left . Generalized weakness and reports he just does not feel well. Has good urine output with Lasix. Sleeping with his head slightly elevated, denies PND .  Able to walk 20 yards without SOB.  He denies chest pain, palpitations. Has not missed any doses of Eliquis and denies bleeding concerns. Daughter reports heart rate is consistently < 60 bpm. One day recently she measured it after patient had been active and it was 38 bpm. No  dizziness, lightheadedness, presyncope, or syncope. Lives in a [place] with his wife whom he cares for. Two daughters are very involved with his care.  ? ?Past Medical History:  ?Diagnosis Date  ? Arthritis   ? Basal cell cancer   ? SKIN.Marland KitchenFACE AND HAND  ? Benign prostatic hypertrophy    ? Bronchitis   ? 11/2013   ? Chest pain 1992  ? NEGATIVE CATH  ? Hyperglycemia   ? PMH of  ? Hyperlipidemia   ? Hypertension   ? Pneumonia 2009  ? treated as OP  ? Tachycardia   ? hx of per patient   ? ? ?Past Surgical History:  ?Procedure Laterality Date  ? BASAL CELL CARCINOMA EXCISION    ? X 3  ? cataract surgery     ? bilateral  ? COLONOSCOPY  2003  ? [facility]  ? [place]  ? Dr  Marylene Buerger  ? INGUINAL HERNIA REPAIR Right 04/06/2014  ? Procedure: REPAIR RIGHT INGUINAL HERNIA WITH MESH;  Surgeon: Jackolyn Confer, MD;  Location: [facility];  Service: General;  Laterality: Right;  ? INSERTION OF MESH Right 04/06/2014  ? Procedure: INSERTION OF MESH;  Surgeon: Jackolyn Confer, MD;  Location: [facility];  Service: General;  Laterality: Right;  ? ? ?Current Medications: ?Current Meds  ?Medication Sig  ? ADVAIR DISKUS 100-50 MCG/ACT AEPB INHALE 1 PUFF BY MOUTH TWICE A DAY  ? apixaban (ELIQUIS) 5 MG TABS tablet Take 1 tablet (5 mg total) by mouth 2 (two) times daily.  ? Cholecalciferol (VITAMIN D) 2000 UNITS tablet Take 2,000 Units by mouth daily.  ? furosemide (LASIX) 20 MG tablet Take 2 tabs daily x 2 days and then 1 tab daily  ? hydrALAZINE (APRESOLINE) 25 MG tablet TAKE 1  TABLET BY MOUTH THREE TIMES A DAY  ? losartan (COZAAR) 100 MG tablet TAKE 1 TABLET BY MOUTH EVERY DAY  ? Multiple Vitamin (MULTIVITAMIN WITH MINERALS) TABS tablet Take 1 tablet by mouth daily.  ?  ? ?Allergies:   Azithromycin and Amlodipine  ? ?Social History  ? ?Socioeconomic History  ? Marital status: Married  ?  Spouse name: Not on file  ? Number of children: 2  ? Years of education: 49  ? Highest education level: Not on file  ?Occupational History  ? Occupation: REITRED  ?Tobacco Use  ? Smoking status: Never  ? Smokeless tobacco: Never  ?Substance and Sexual Activity  ? Alcohol use: No  ? Drug use: No  ? Sexual activity: Not Currently  ?Other Topics Concern  ? Not on file  ?Social History Narrative  ? Denies abuse and feels safe at home.   ? ?Social Determinants of Health  ? ?Financial Resource Strain: Low Risk   ? Difficulty of Paying Living Expenses: Not hard at all  ?Food Insecurity: No Food Insecurity  ? Worried About Charity fundraiser in the Last Year: Never true  ? Ran Out of Food in the Last Year: Never true  ?Transportation Needs: No Transportation Needs  ? Lack of Transportation (Medical): No  ? Lack of Transportation (Non-Medical): No  ?Physical Activity: Sufficiently Active  ? Days of Exercise per Week: 5 days  ? Minutes of Exercise per Session: 30 min  ?Stress: No Stress Concern Present  ? Feeling of Stress : Not at all  ?Social Connections: Socially Integrated  ? Frequency of Communication with Friends and Family: More than three times a week  ? Frequency of Social Gatherings with Friends and Family: More than three times a week  ? Attends Religious Services: More than 4 times per year  ? Active Member of Clubs or Organizations: Yes  ? Attends Archivist Meetings: More than 4 times per year  ? Marital Status: Married  ?  ? ?Family History: ?The patient's family history includes Alcohol abuse in his brother; Heart attack (age of onset: 73) in his father; Heart disease in his mother; Hypertension in his father and mother; Lung disease in his father; Transient ischemic attack in his mother. There is no history of Cancer or Diabetes. ? ?ROS:   ?Please see the history of present illness.    ?+ bilateral leg edema  ?+ fatigue ?+ weakness ?All other systems reviewed and are negative. ? ?Labs/Other Studies Reviewed:   ? ?The following studies were reviewed today: ? ?Echo 03/16/21 ? ? ? Left Ventricle: Left ventricular ejection fraction, by estimation, is 60  ?to 65%. The left ventricle has normal function. The left ventricle has no  ?regional wall motion abnormalities. The left ventricular internal cavity  ?size was normal in size. There is  ? mild asymmetric left ventricular hypertrophy of the basal-septal segment.  ?Diastolic  function indeterminant due to AFib.  ?Right Ventricle: The right ventricular size is normal. No increase in  ?right ventricular wall thickness. Right ventricular systolic function is  ?normal. There is normal pulmonary artery systolic pressure. The tricuspid  ?regurgitant velocity is 2.84 m/s, and  ? with an assumed right atrial pressure of 3 mmHg, the estimated right  ?ventricular systolic pressure is 40.9 mmHg.  ?Left Atrium: Left atrial size was mildly dilated.  ?Right Atrium: Right atrial size was mildly dilated.  ?Pericardium: There is no evidence of pericardial effusion.  ?Mitral Valve: The mitral valve is  normal in structure. There is mild  ?thickening of the mitral valve leaflet(s). There is mild calcification of  ?the mitral valve leaflet(s). Mild mitral annular calcification. Mild  ?mitral valve regurgitation. MV peak  ?gradient, 100.4 mmHg. The mean mitral valve gradient is 77.0 mmHg.  ?Tricuspid Valve: The tricuspid valve is normal in structure. Tricuspid  ?valve regurgitation is mild.  ?Aortic Valve: The aortic valve is tricuspid. There is mild calcification  ?of the aortic valve. There is mild thickening of the aortic valve. Aortic  ?valve regurgitation is mild. Aortic regurgitation PHT measures 715 msec.  ?Aortic valve  ?sclerosis/calcification is present, without any evidence of aortic  ?stenosis. Aortic valve mean gradient measures 2.0 mmHg. Aortic valve peak  ?gradient measures 4.1 mmHg. Aortic valve area, by VTI measures 2.29 cm?Marland Kitchen  ?Pulmonic Valve: The pulmonic valve was normal in structure. Pulmonic valve  ?regurgitation is mild to moderate.  ?Aorta: The aortic root and ascending aorta are structurally normal, with  ?no evidence of dilitation.  ?IAS/Shunts: The atrial septum is grossly normal.  ? ?CXR 02/2021 ? ?Cardiomegaly with diffuse bilateral interstitial pulmonary opacity ?and small bilateral pleural effusions, likely edema. No focal ?airspace opacity. ? ?Recent Labs: ?03/08/2021: ALT 18;  Hemoglobin 14.4; Platelets 257.0; TSH 1.19 ?04/18/2021: BUN 26; Creatinine, Ser 1.36; NT-Pro BNP 2,089; Potassium 4.3; Sodium 140  ?Recent Lipid Panel ?   ?Component Value Date/Time  ? CHOL 175 01/29/2020 1035

## 2021-05-04 ENCOUNTER — Other Ambulatory Visit: Payer: Self-pay

## 2021-05-04 ENCOUNTER — Ambulatory Visit: Payer: Medicare HMO | Admitting: Nurse Practitioner

## 2021-05-04 ENCOUNTER — Encounter: Payer: Self-pay | Admitting: Nurse Practitioner

## 2021-05-04 VITALS — BP 122/68 | HR 66 | Ht 65.0 in | Wt 166.2 lb

## 2021-05-04 DIAGNOSIS — I509 Heart failure, unspecified: Secondary | ICD-10-CM

## 2021-05-04 DIAGNOSIS — R600 Localized edema: Secondary | ICD-10-CM | POA: Diagnosis not present

## 2021-05-04 DIAGNOSIS — I480 Paroxysmal atrial fibrillation: Secondary | ICD-10-CM | POA: Diagnosis not present

## 2021-05-04 DIAGNOSIS — M7989 Other specified soft tissue disorders: Secondary | ICD-10-CM | POA: Diagnosis not present

## 2021-05-04 DIAGNOSIS — I4819 Other persistent atrial fibrillation: Secondary | ICD-10-CM | POA: Diagnosis not present

## 2021-05-04 NOTE — Patient Instructions (Signed)
Medication Instructions:  ?STOP Propranolol (Inderal) ?INCREASE Furosemide (Lasix) to '40mg'$  daily for 3-5 days only ? ?*If you need a refill on your cardiac medications before your next appointment, please call your pharmacy* ? ? ?Lab Work: ?ProBNP, BMET Today ?If you have labs (blood work) drawn today and your tests are completely normal, you will receive your results only by: ?MyChart Message (if you have MyChart) OR ?A paper copy in the mail ?If you have any lab test that is abnormal or we need to change your treatment, we will call you to review the results. ? ? ?Testing/Procedures: ?NONE ? ? ?Follow-Up: ?A-fib Clinic in 3-4 weeks for consideration of cardioversion  ? ?Provider:  ? ? ?Other Instructions ?Please report back to [name] via telephone or MyChart if after 5 days of increased Lasix hasn't improved symptoms  ?  ?

## 2021-05-05 ENCOUNTER — Telehealth: Payer: Self-pay

## 2021-05-05 DIAGNOSIS — Z79899 Other long term (current) drug therapy: Secondary | ICD-10-CM

## 2021-05-05 LAB — BASIC METABOLIC PANEL
BUN/Creatinine Ratio: 13 (ref 10–24)
BUN: 18 mg/dL (ref 10–36)
CO2: 24 mmol/L (ref 20–29)
Calcium: 9.6 mg/dL (ref 8.6–10.2)
Chloride: 105 mmol/L (ref 96–106)
Creatinine, Ser: 1.44 mg/dL — ABNORMAL HIGH (ref 0.76–1.27)
Glucose: 106 mg/dL — ABNORMAL HIGH (ref 70–99)
Potassium: 5 mmol/L (ref 3.5–5.2)
Sodium: 142 mmol/L (ref 134–144)
eGFR: 46 mL/min/{1.73_m2} — ABNORMAL LOW (ref >59)

## 2021-05-05 LAB — PRO B NATRIURETIC PEPTIDE: NT-Pro BNP: 2811 pg/mL — ABNORMAL HIGH (ref 0–486)

## 2021-05-05 NOTE — Telephone Encounter (Signed)
-----   Message from Emmaline Life, NP sent at 05/05/2021  7:11 AM EDT ----- ?As we suspected during office visit on 3/16, fluid is up. We have advised him to increase Lasix to 40 mg for 3-5 days - he will likely need the increase for the full 5 days. He is not on a potassium supplement. Please encourage him to increase intake of foods rich in potassium. We need to recheck kidney function next week on Wednesday or Thursday. He should have a follow-up appointment with A Fib clinic in 3-4 weeks.  ?

## 2021-05-05 NOTE — Telephone Encounter (Signed)
Called and made pt aware. Understands to increase foods rich in potassium and lab orders placed and scheduled for 3/23 (Thursday) ?

## 2021-05-11 ENCOUNTER — Other Ambulatory Visit: Payer: Medicare HMO

## 2021-05-11 ENCOUNTER — Other Ambulatory Visit: Payer: Self-pay

## 2021-05-11 DIAGNOSIS — Z79899 Other long term (current) drug therapy: Secondary | ICD-10-CM

## 2021-05-11 LAB — BASIC METABOLIC PANEL
BUN/Creatinine Ratio: 18 (ref 10–24)
BUN: 22 mg/dL (ref 10–36)
CO2: 27 mmol/L (ref 20–29)
Calcium: 10 mg/dL (ref 8.6–10.2)
Chloride: 105 mmol/L (ref 96–106)
Creatinine, Ser: 1.25 mg/dL (ref 0.76–1.27)
Glucose: 113 mg/dL — ABNORMAL HIGH (ref 70–99)
Potassium: 5.3 mmol/L — ABNORMAL HIGH (ref 3.5–5.2)
Sodium: 142 mmol/L (ref 134–144)
eGFR: 54 mL/min/{1.73_m2} — ABNORMAL LOW (ref >59)

## 2021-05-23 ENCOUNTER — Ambulatory Visit (HOSPITAL_COMMUNITY): Payer: Medicare HMO | Admitting: Physician Assistant

## 2021-05-24 ENCOUNTER — Encounter (HOSPITAL_COMMUNITY): Payer: Self-pay | Admitting: Physician Assistant

## 2021-05-24 ENCOUNTER — Ambulatory Visit (HOSPITAL_COMMUNITY)
Admission: RE | Admit: 2021-05-24 | Discharge: 2021-05-24 | Disposition: A | Discharge status: Home / Self Care | Payer: Medicare HMO | Source: Ambulatory Visit | Attending: Physician Assistant | Admitting: Physician Assistant

## 2021-05-24 VITALS — BP 138/66 | HR 58 | Ht 65.0 in | Wt 160.6 lb

## 2021-05-24 DIAGNOSIS — I4819 Other persistent atrial fibrillation: Secondary | ICD-10-CM

## 2021-05-24 DIAGNOSIS — Z7901 Long term (current) use of anticoagulants: Secondary | ICD-10-CM | POA: Diagnosis not present

## 2021-05-24 DIAGNOSIS — I5032 Chronic diastolic (congestive) heart failure: Secondary | ICD-10-CM | POA: Insufficient documentation

## 2021-05-24 DIAGNOSIS — Z79899 Other long term (current) drug therapy: Secondary | ICD-10-CM | POA: Diagnosis not present

## 2021-05-24 DIAGNOSIS — I110 Hypertensive heart disease with heart failure: Secondary | ICD-10-CM | POA: Insufficient documentation

## 2021-05-24 DIAGNOSIS — D6869 Other thrombophilia: Secondary | ICD-10-CM

## 2021-05-24 DIAGNOSIS — D6859 Other primary thrombophilia: Secondary | ICD-10-CM | POA: Diagnosis not present

## 2021-05-24 LAB — BASIC METABOLIC PANEL
Anion gap: 6 (ref 5–15)
BUN: 20 mg/dL (ref 8–23)
CO2: 24 mmol/L (ref 22–32)
Calcium: 9.3 mg/dL (ref 8.9–10.3)
Chloride: 108 mmol/L (ref 98–111)
Creatinine, Ser: 1.39 mg/dL — ABNORMAL HIGH (ref 0.61–1.24)
GFR, Estimated: 48 mL/min — ABNORMAL LOW (ref >60)
Glucose, Bld: 102 mg/dL — ABNORMAL HIGH (ref 70–99)
Potassium: 4.3 mmol/L (ref 3.5–5.1)
Sodium: 138 mmol/L (ref 135–145)

## 2021-05-24 LAB — CBC
HCT: 44.8 % (ref 39.0–52.0)
Hemoglobin: 15 g/dL (ref 13.0–17.0)
MCH: 30.4 pg (ref 26.0–34.0)
MCHC: 33.5 g/dL (ref 30.0–36.0)
MCV: 90.9 fL (ref 80.0–100.0)
Platelets: 248 10*3/uL (ref 150–400)
RBC: 4.93 MIL/uL (ref 4.22–5.81)
RDW: 13.2 % (ref 11.5–15.5)
WBC: 8.3 10*3/uL (ref 4.0–10.5)
nRBC: 0 % (ref 0.0–0.2)

## 2021-05-24 NOTE — Patient Instructions (Signed)
Cardioversion scheduled for Wednesday, April 19th ? - Arrive at the [facility] and go to admitting at 830am ? - Do not eat or drink anything after midnight the night prior to your procedure. ? - Take all your morning medication (except diabetic medications) with a sip of water prior to arrival. ? - You will not be able to drive home after your procedure. ? - Do NOT miss any doses of your blood thinner - if you should miss a dose please notify our office immediately. ? - If you feel as if you go back into normal rhythm prior to scheduled cardioversion, please notify our office immediately. If your procedure is canceled in the cardioversion suite you will be charged a cancellation fee. ? ?

## 2021-05-24 NOTE — H&P (View-Only) (Signed)
? ? ?Primary Care Physician: Binnie Rail, MD ?Primary Cardiologist: Dr Johnsie Cancel ?Primary Electrophysiologist: none ?Referring Physician: Christen Bame NP ? ? ?Christopher Richards is a 88 y.o. male with a history of HFpEF, HTN, atrial fibrillation who presents for consultation in the [facility].  The patient was initially diagnosed with atrial fibrillation 03/08/21 after presenting to his PCP with symptoms of an URI and SOB. He also had some increased lower extremity edema. ECG showed afib with slow ventricular response. Patient is on Eliquis for a CHADS2VASC score of 4. Patient does still have intermittent palpitations but his lower extremity edema has resolved. His weight is down an additional 5-6 lbs. He denies any bleeding issues on anticoagulation.  ? ?Today, he denies symptoms of chest pain, orthopnea, PND, lower extremity edema, dizziness, presyncope, syncope, snoring, daytime somnolence, bleeding, or neurologic sequela. The patient is tolerating medications without difficulties and is otherwise without complaint today.  ? ? ?Atrial Fibrillation Risk Factors: ? ?he does not have symptoms or diagnosis of sleep apnea. ?he does not have a history of rheumatic fever. ?The patient does have a history of early familial atrial fibrillation or other arrhythmias. ? ?he has a BMI of Body mass index is 26.73 kg/m?Marland KitchenMarland Kitchen ?Filed Weights  ? 05/24/21 1402  ?Weight: 72.8 kg  ? ? ?Family History  ?Problem Relation Age of Onset  ? Alcohol abuse Brother   ? Hypertension Mother   ? Heart disease Mother   ?     AF  ? Transient ischemic attack Mother   ? Heart attack Father 14  ? Hypertension Father   ? Lung disease Father   ?     ? [facility] visitor)  ? Cancer Neg Hx   ? Diabetes Neg Hx   ? ? ? ?Atrial Fibrillation Management history: ? ?Previous antiarrhythmic drugs: none ?Previous cardioversions: none ?Previous ablations: none ?CHADS2VASC score: 4 ?Anticoagulation history: Eliquis ? ? ?Past Medical  History:  ?Diagnosis Date  ? Arthritis   ? Basal cell cancer   ? SKIN.Marland KitchenFACE AND HAND  ? Benign prostatic hypertrophy   ? Bronchitis   ? 11/2013   ? Chest pain 1992  ? NEGATIVE CATH  ? Hyperglycemia   ? PMH of  ? Hyperlipidemia   ? Hypertension   ? Pneumonia 2009  ? treated as OP  ? Tachycardia   ? hx of per patient   ? ?Past Surgical History:  ?Procedure Laterality Date  ? BASAL CELL CARCINOMA EXCISION    ? X 3  ? cataract surgery     ? bilateral  ? COLONOSCOPY  2003  ? [facility]  ? [place]  ? Dr  Marylene Buerger  ? INGUINAL HERNIA REPAIR Right 04/06/2014  ? Procedure: REPAIR RIGHT INGUINAL HERNIA WITH MESH;  Surgeon: Jackolyn Confer, MD;  Location: [facility];  Service: General;  Laterality: Right;  ? INSERTION OF MESH Right 04/06/2014  ? Procedure: INSERTION OF MESH;  Surgeon: Jackolyn Confer, MD;  Location: [facility];  Service: General;  Laterality: Right;  ? ? ?Current Outpatient Medications  ?Medication Sig Dispense Refill  ? ADVAIR DISKUS 100-50 MCG/ACT AEPB INHALE 1 PUFF BY MOUTH TWICE A DAY 60 each 1  ? apixaban (ELIQUIS) 5 MG TABS tablet Take 1 tablet (5 mg total) by mouth 2 (two) times daily. 60 tablet 5  ? Cholecalciferol (VITAMIN D) 2000 UNITS tablet Take 2,000 Units by mouth daily.    ? furosemide (LASIX) 20 MG  tablet Take 2 tabs daily x 2 days and then 1 tab daily 30 tablet 5  ? hydrALAZINE (APRESOLINE) 25 MG tablet TAKE 1 TABLET BY MOUTH THREE TIMES A DAY 270 tablet 2  ? losartan (COZAAR) 100 MG tablet TAKE 1 TABLET BY MOUTH EVERY DAY 90 tablet 1  ? Multiple Vitamin (MULTIVITAMIN WITH MINERALS) TABS tablet Take 1 tablet by mouth daily.    ? ?No current facility-administered medications for this encounter.  ? ? ?Allergies  ?Allergen Reactions  ? Azithromycin Other (See Comments)  ?  Burning in tongue  ? Amlodipine Other (See Comments)  ?  Ankle edema  ? ? ?Social History  ? ?Socioeconomic History  ? Marital status: Married  ?  Spouse name: Not on file  ? Number of children: 2  ? Years of education: 58   ? Highest education level: Not on file  ?Occupational History  ? Occupation: REITRED  ?Tobacco Use  ? Smoking status: Never  ? Smokeless tobacco: Never  ?Substance and Sexual Activity  ? Alcohol use: No  ? Drug use: No  ? Sexual activity: Not Currently  ?Other Topics Concern  ? Not on file  ?Social History Narrative  ? Denies abuse and feels safe at home.  ? ?Social Determinants of Health  ? ?Financial Resource Strain: Low Risk   ? Difficulty of Paying Living Expenses: Not hard at all  ?Food Insecurity: No Food Insecurity  ? Worried About Charity fundraiser in the Last Year: Never true  ? Ran Out of Food in the Last Year: Never true  ?Transportation Needs: No Transportation Needs  ? Lack of Transportation (Medical): No  ? Lack of Transportation (Non-Medical): No  ?Physical Activity: Sufficiently Active  ? Days of Exercise per Week: 5 days  ? Minutes of Exercise per Session: 30 min  ?Stress: No Stress Concern Present  ? Feeling of Stress : Not at all  ?Social Connections: Socially Integrated  ? Frequency of Communication with Friends and Family: More than three times a week  ? Frequency of Social Gatherings with Friends and Family: More than three times a week  ? Attends Religious Services: More than 4 times per year  ? Active Member of Clubs or Organizations: Yes  ? Attends Archivist Meetings: More than 4 times per year  ? Marital Status: Married  ?Intimate Partner Violence: Not on file  ? ? ? ?ROS- All systems are reviewed and negative except as per the HPI above. ? ?Physical Exam: ?Vitals:  ? 05/24/21 1402  ?BP: 138/66  ?Pulse: (!) 58  ?Weight: 72.8 kg  ?Height: '5\' 5"'$  (1.651 m)  ? ? ?GEN- The patient is a well appearing elderly male, alert and oriented x 3 today.   ?Head- normocephalic, atraumatic ?Eyes-  Sclera clear, conjunctiva pink ?Ears- hearing intact ?Oropharynx- clear ?Neck- supple  ?Lungs- Clear to ausculation bilaterally, normal work of breathing ?Heart- irregular rate and rhythm, no rubs or  gallops, 2/6 systolic murmur ?GI- soft, NT, ND, + BS ?Extremities- no clubbing, cyanosis, or edema ?MS- no significant deformity or atrophy ?Skin- no rash or lesion ?Psych- euthymic mood, full affect ?Neuro- strength and sensation are intact ? ?Wt Readings from Last 3 Encounters:  ?05/24/21 72.8 kg  ?05/04/21 75.4 kg  ?03/10/21 73.5 kg  ? ? ?EKG today demonstrates  ?Afib ?Vent. rate 58 BPM ?PR interval * ms ?QRS duration 86 ms ?QT/QTcB 408/400 ms ? ?Echo 03/16/21 demonstrated  ? 1. Left ventricular ejection fraction, by estimation,  is 60 to 65%. The  ?left ventricle has normal function. The left ventricle has no regional  ?wall motion abnormalities. There is mild asymmetric left ventricular  ?hypertrophy of the basal-septal segment. Diastolic function indeterminant due to AFib.  ? 2. Right ventricular systolic function is normal. The right ventricular  ?size is normal. There is normal pulmonary artery systolic pressure.  ? 3. Left atrial size was mildly dilated.  ? 4. Right atrial size was mildly dilated.  ? 5. The mitral valve is normal in structure. Mild mitral valve  ?regurgitation.  ? 6. The aortic valve is tricuspid. There is mild calcification of the  ?aortic valve. There is mild thickening of the aortic valve. Aortic valve  ?regurgitation is mild. Aortic valve sclerosis/calcification is present,  ?without any evidence of aortic stenosis.  ? ?Comparison(s): Compared to prior TTE in 2018, there is no significant  ?difference.  ? ?Epic records are reviewed at length today ? ?CHA2DS2-VASc Score = 4  ?The patient's score is based upon: ?CHF History: 1 ?HTN History: 1 ?Diabetes History: 0 ?Stroke History: 0 ?Vascular Disease History: 0 ?Age Score: 2 ?Gender Score: 0 ?    ? ? ?ASSESSMENT AND PLAN: ?1. Persistent Atrial Fibrillation (ICD10:  I48.19) ?The patient's CHA2DS2-VASc score is 4, indicating a 4.8% annual risk of stroke.   ?Patient remains in rate controlled afib.  ?After discussing the risks and benefits,  will arrange for DCCV at this time. Patient denies any missed doses of anticoagulation in the last 3 weeks.  ?Continue Eliquis 5 mg BID ?Check bmet/cbc ? ?2. Secondary Hypercoagulable State (ICD10:  D6

## 2021-05-24 NOTE — Progress Notes (Signed)
? ? ?Primary Care Physician: Binnie Rail, MD ?Primary Cardiologist: Dr Johnsie Cancel ?Primary Electrophysiologist: none ?Referring Physician: Christen Bame NP ? ? ?Christopher Richards is a 88 y.o. male with a history of HFpEF, HTN, atrial fibrillation who presents for consultation in the [facility].  The patient was initially diagnosed with atrial fibrillation 03/08/21 after presenting to his PCP with symptoms of an URI and SOB. He also had some increased lower extremity edema. ECG showed afib with slow ventricular response. Patient is on Eliquis for a CHADS2VASC score of 4. Patient does still have intermittent palpitations but his lower extremity edema has resolved. His weight is down an additional 5-6 lbs. He denies any bleeding issues on anticoagulation.  ? ?Today, he denies symptoms of chest pain, orthopnea, PND, lower extremity edema, dizziness, presyncope, syncope, snoring, daytime somnolence, bleeding, or neurologic sequela. The patient is tolerating medications without difficulties and is otherwise without complaint today.  ? ? ?Atrial Fibrillation Risk Factors: ? ?he does not have symptoms or diagnosis of sleep apnea. ?he does not have a history of rheumatic fever. ?The patient does have a history of early familial atrial fibrillation or other arrhythmias. ? ?he has a BMI of Body mass index is 26.73 kg/m?Marland KitchenMarland Kitchen ?Filed Weights  ? 05/24/21 1402  ?Weight: 72.8 kg  ? ? ?Family History  ?Problem Relation Age of Onset  ? Alcohol abuse Brother   ? Hypertension Mother   ? Heart disease Mother   ?     AF  ? Transient ischemic attack Mother   ? Heart attack Father 64  ? Hypertension Father   ? Lung disease Father   ?     ? [facility] visitor)  ? Cancer Neg Hx   ? Diabetes Neg Hx   ? ? ? ?Atrial Fibrillation Management history: ? ?Previous antiarrhythmic drugs: none ?Previous cardioversions: none ?Previous ablations: none ?CHADS2VASC score: 4 ?Anticoagulation history: Eliquis ? ? ?Past Medical  History:  ?Diagnosis Date  ? Arthritis   ? Basal cell cancer   ? SKIN.Marland KitchenFACE AND HAND  ? Benign prostatic hypertrophy   ? Bronchitis   ? 11/2013   ? Chest pain 1992  ? NEGATIVE CATH  ? Hyperglycemia   ? PMH of  ? Hyperlipidemia   ? Hypertension   ? Pneumonia 2009  ? treated as OP  ? Tachycardia   ? hx of per patient   ? ?Past Surgical History:  ?Procedure Laterality Date  ? BASAL CELL CARCINOMA EXCISION    ? X 3  ? cataract surgery     ? bilateral  ? COLONOSCOPY  2003  ? [facility]  ? [place]  ? Dr  Marylene Buerger  ? INGUINAL HERNIA REPAIR Right 04/06/2014  ? Procedure: REPAIR RIGHT INGUINAL HERNIA WITH MESH;  Surgeon: Jackolyn Confer, MD;  Location: [facility];  Service: General;  Laterality: Right;  ? INSERTION OF MESH Right 04/06/2014  ? Procedure: INSERTION OF MESH;  Surgeon: Jackolyn Confer, MD;  Location: [facility];  Service: General;  Laterality: Right;  ? ? ?Current Outpatient Medications  ?Medication Sig Dispense Refill  ? ADVAIR DISKUS 100-50 MCG/ACT AEPB INHALE 1 PUFF BY MOUTH TWICE A DAY 60 each 1  ? apixaban (ELIQUIS) 5 MG TABS tablet Take 1 tablet (5 mg total) by mouth 2 (two) times daily. 60 tablet 5  ? Cholecalciferol (VITAMIN D) 2000 UNITS tablet Take 2,000 Units by mouth daily.    ? furosemide (LASIX) 20 MG  tablet Take 2 tabs daily x 2 days and then 1 tab daily 30 tablet 5  ? hydrALAZINE (APRESOLINE) 25 MG tablet TAKE 1 TABLET BY MOUTH THREE TIMES A DAY 270 tablet 2  ? losartan (COZAAR) 100 MG tablet TAKE 1 TABLET BY MOUTH EVERY DAY 90 tablet 1  ? Multiple Vitamin (MULTIVITAMIN WITH MINERALS) TABS tablet Take 1 tablet by mouth daily.    ? ?No current facility-administered medications for this encounter.  ? ? ?Allergies  ?Allergen Reactions  ? Azithromycin Other (See Comments)  ?  Burning in tongue  ? Amlodipine Other (See Comments)  ?  Ankle edema  ? ? ?Social History  ? ?Socioeconomic History  ? Marital status: Married  ?  Spouse name: Not on file  ? Number of children: 2  ? Years of education: 71   ? Highest education level: Not on file  ?Occupational History  ? Occupation: [employer]  ?Tobacco Use  ? Smoking status: Never  ? Smokeless tobacco: Never  ?Substance and Sexual Activity  ? Alcohol use: No  ? Drug use: No  ? Sexual activity: Not Currently  ?Other Topics Concern  ? Not on file  ?Social History Narrative  ? Denies abuse and feels safe at home.  ? ?Social Determinants of Health  ? ?Financial Resource Strain: Low Risk   ? Difficulty of Paying Living Expenses: Not hard at all  ?Food Insecurity: No Food Insecurity  ? Worried About Charity fundraiser in the Last Year: Never true  ? Ran Out of Food in the Last Year: Never true  ?Transportation Needs: No Transportation Needs  ? Lack of Transportation (Medical): No  ? Lack of Transportation (Non-Medical): No  ?Physical Activity: Sufficiently Active  ? Days of Exercise per Week: 5 days  ? Minutes of Exercise per Session: 30 min  ?Stress: No Stress Concern Present  ? Feeling of Stress : Not at all  ?Social Connections: Socially Integrated  ? Frequency of Communication with Friends and Family: More than three times a week  ? Frequency of Social Gatherings with Friends and Family: More than three times a week  ? Attends Religious Services: More than 4 times per year  ? Active Member of Clubs or Organizations: Yes  ? Attends Archivist Meetings: More than 4 times per year  ? Marital Status: Married  ?Intimate Partner Violence: Not on file  ? ? ? ?ROS- All systems are reviewed and negative except as per the HPI above. ? ?Physical Exam: ?Vitals:  ? 05/24/21 1402  ?BP: 138/66  ?Pulse: (!) 58  ?Weight: 72.8 kg  ?Height: '5\' 5"'$  (1.651 m)  ? ? ?GEN- The patient is a well appearing elderly male, alert and oriented x 3 today.   ?Head- normocephalic, atraumatic ?Eyes-  Sclera clear, conjunctiva pink ?Ears- hearing intact ?Oropharynx- clear ?Neck- supple  ?Lungs- Clear to ausculation bilaterally, normal work of breathing ?Heart- irregular rate and rhythm, no rubs or  gallops, 2/6 systolic murmur ?GI- soft, NT, ND, + BS ?Extremities- no clubbing, cyanosis, or edema ?MS- no significant deformity or atrophy ?Skin- no rash or lesion ?Psych- euthymic mood, full affect ?Neuro- strength and sensation are intact ? ?Wt Readings from Last 3 Encounters:  ?05/24/21 72.8 kg  ?05/04/21 75.4 kg  ?03/10/21 73.5 kg  ? ? ?EKG today demonstrates  ?Afib ?Vent. rate 58 BPM ?PR interval * ms ?QRS duration 86 ms ?QT/QTcB 408/400 ms ? ?Echo 03/16/21 demonstrated  ? 1. Left ventricular ejection fraction, by estimation,  is 60 to 65%. The  ?left ventricle has normal function. The left ventricle has no regional  ?wall motion abnormalities. There is mild asymmetric left ventricular  ?hypertrophy of the basal-septal segment. Diastolic function indeterminant due to AFib.  ? 2. Right ventricular systolic function is normal. The right ventricular  ?size is normal. There is normal pulmonary artery systolic pressure.  ? 3. Left atrial size was mildly dilated.  ? 4. Right atrial size was mildly dilated.  ? 5. The mitral valve is normal in structure. Mild mitral valve  ?regurgitation.  ? 6. The aortic valve is tricuspid. There is mild calcification of the  ?aortic valve. There is mild thickening of the aortic valve. Aortic valve  ?regurgitation is mild. Aortic valve sclerosis/calcification is present,  ?without any evidence of aortic stenosis.  ? ?Comparison(s): Compared to prior TTE in 2018, there is no significant  ?difference.  ? ?Epic records are reviewed at length today ? ?CHA2DS2-VASc Score = 4  ?The patient's score is based upon: ?CHF History: 1 ?HTN History: 1 ?Diabetes History: 0 ?Stroke History: 0 ?Vascular Disease History: 0 ?Age Score: 2 ?Gender Score: 0 ?    ? ? ?ASSESSMENT AND PLAN: ?1. Persistent Atrial Fibrillation (ICD10:  I48.19) ?The patient's CHA2DS2-VASc score is 4, indicating a 4.8% annual risk of stroke.   ?Patient remains in rate controlled afib.  ?After discussing the risks and benefits,  will arrange for DCCV at this time. Patient denies any missed doses of anticoagulation in the last 3 weeks.  ?Continue Eliquis 5 mg BID ?Check bmet/cbc ? ?2. Secondary Hypercoagulable State (ICD10:  D6

## 2021-06-06 NOTE — Anesthesia Preprocedure Evaluation (Addendum)
Anesthesia Evaluation  ?Patient identified by MRN, date of birth, ID band ?Patient awake ? ? ? ?Reviewed: ?Allergy & Precautions, NPO status , Patient's Chart, lab work & pertinent test results ? ?Airway ?Mallampati: II ? ?TM Distance: >3 FB ?Neck ROM: Full ? ? ? Dental ? ?(+) Dental Advisory Given, Lower Dentures, Upper Dentures ?  ?Pulmonary ?neg pulmonary ROS,  ?  ?Pulmonary exam normal ?breath sounds clear to auscultation ? ? ? ? ? ? Cardiovascular ?hypertension, Pt. on medications ?(-) angina+ DOE  ?+ dysrhythmias Atrial Fibrillation  ?Rhythm:Irregular Rate:Abnormal ? ? ?  ?Neuro/Psych ?negative neurological ROS ? negative psych ROS  ? GI/Hepatic ?Neg liver ROS, hiatal hernia,   ?Endo/Other  ?negative endocrine ROS ? Renal/GU ?negative Renal ROS  ? ?  ?Musculoskeletal ? ?(+) Arthritis ,  ? Abdominal ?  ?Peds ? Hematology ? ?(+) Blood dyscrasia (Eliquis), ,   ?Anesthesia Other Findings ? ? Reproductive/Obstetrics ? ?  ? ? ? ? ? ? ? ? ? ? ? ? ? ?  ?  ? ? ? ? ? ? ? ?Anesthesia Physical ?Anesthesia Plan ? ?ASA: 4 ? ?Anesthesia Plan: General  ? ?Post-op Pain Management:   ? ?Induction: Intravenous ? ?PONV Risk Score and Plan: 2 and TIVA ? ?Airway Management Planned: Mask ? ?Additional Equipment:  ? ?Intra-op Plan:  ? ?Post-operative Plan:  ? ?Informed Consent: I have reviewed the patients History and Physical, chart, labs and discussed the procedure including the risks, benefits and alternatives for the proposed anesthesia with the patient or authorized representative who has indicated his/her understanding and acceptance.  ? ? ? ?Dental advisory given ? ?Plan Discussed with: CRNA ? ?Anesthesia Plan Comments:   ? ? ? ? ? ?Anesthesia Quick Evaluation ? ?

## 2021-06-07 ENCOUNTER — Ambulatory Visit (HOSPITAL_COMMUNITY)
Admission: RE | Admit: 2021-06-07 | Discharge: 2021-06-07 | Disposition: A | Discharge status: Home / Self Care | Payer: Medicare HMO | Source: Ambulatory Visit | Attending: Cardiology | Admitting: Cardiology

## 2021-06-07 ENCOUNTER — Ambulatory Visit (HOSPITAL_COMMUNITY): Payer: Medicare HMO | Admitting: Anesthesiology

## 2021-06-07 ENCOUNTER — Encounter (HOSPITAL_COMMUNITY): Payer: Self-pay | Admitting: Cardiology

## 2021-06-07 ENCOUNTER — Encounter (HOSPITAL_COMMUNITY)
Admission: RE | Disposition: A | Discharge status: Home / Self Care | Payer: Self-pay | Source: Ambulatory Visit | Attending: Cardiology

## 2021-06-07 ENCOUNTER — Other Ambulatory Visit: Payer: Self-pay

## 2021-06-07 ENCOUNTER — Ambulatory Visit (HOSPITAL_BASED_OUTPATIENT_CLINIC_OR_DEPARTMENT_OTHER): Payer: Medicare HMO | Admitting: Anesthesiology

## 2021-06-07 DIAGNOSIS — I4819 Other persistent atrial fibrillation: Secondary | ICD-10-CM

## 2021-06-07 DIAGNOSIS — Z7901 Long term (current) use of anticoagulants: Secondary | ICD-10-CM | POA: Insufficient documentation

## 2021-06-07 DIAGNOSIS — D759 Disease of blood and blood-forming organs, unspecified: Secondary | ICD-10-CM | POA: Diagnosis not present

## 2021-06-07 DIAGNOSIS — K449 Diaphragmatic hernia without obstruction or gangrene: Secondary | ICD-10-CM

## 2021-06-07 DIAGNOSIS — I4891 Unspecified atrial fibrillation: Secondary | ICD-10-CM | POA: Diagnosis not present

## 2021-06-07 DIAGNOSIS — D6869 Other thrombophilia: Secondary | ICD-10-CM | POA: Insufficient documentation

## 2021-06-07 DIAGNOSIS — I10 Essential (primary) hypertension: Secondary | ICD-10-CM | POA: Diagnosis not present

## 2021-06-07 DIAGNOSIS — I700 Atherosclerosis of aorta: Secondary | ICD-10-CM | POA: Insufficient documentation

## 2021-06-07 DIAGNOSIS — Z79899 Other long term (current) drug therapy: Secondary | ICD-10-CM | POA: Insufficient documentation

## 2021-06-07 DIAGNOSIS — I5032 Chronic diastolic (congestive) heart failure: Secondary | ICD-10-CM | POA: Diagnosis not present

## 2021-06-07 DIAGNOSIS — I110 Hypertensive heart disease with heart failure: Secondary | ICD-10-CM | POA: Insufficient documentation

## 2021-06-07 DIAGNOSIS — I080 Rheumatic disorders of both mitral and aortic valves: Secondary | ICD-10-CM | POA: Insufficient documentation

## 2021-06-07 DIAGNOSIS — M1990 Unspecified osteoarthritis, unspecified site: Secondary | ICD-10-CM | POA: Diagnosis not present

## 2021-06-07 HISTORY — PX: CARDIOVERSION: SHX1299

## 2021-06-07 SURGERY — CARDIOVERSION
Anesthesia: General

## 2021-06-07 MED ORDER — PROPOFOL 10 MG/ML IV BOLUS
INTRAVENOUS | Status: DC | PRN
Start: 2021-06-07 — End: 2021-06-07
  Administered 2021-06-07: 50 mg via INTRAVENOUS

## 2021-06-07 MED ORDER — LIDOCAINE 2% (20 MG/ML) 5 ML SYRINGE
INTRAMUSCULAR | Status: DC | PRN
  Administered 2021-06-07: 60 mg via INTRAVENOUS

## 2021-06-07 MED ORDER — SODIUM CHLORIDE 0.9 % IV SOLN: INTRAVENOUS | Status: DC

## 2021-06-07 NOTE — CV Procedure (Signed)
Procedure:   DCCV ? ?Indication:  Symptomatic atrial fibrillation ? ?Procedure Note:  The patient signed informed consent.  They have had had therapeutic anticoagulation with apixaban greater than 3 weeks.  Anesthesia was administered by Dr. Gifford Shave.  Patient received 60 mg IV lidocaine and 50 mg IV propofol.Adequate airway was maintained throughout and vital followed per protocol.  They were cardioverted x 2 with 120, 150J of biphasic synchronized energy.  They converted to NSR.  There were no apparent complications.  The patient had normal neuro status and respiratory status post procedure with vitals stable as recorded elsewhere.   ? ?Follow up:  They will continue on current medical therapy and follow up with cardiology as scheduled. ? ?Buford Dresser, MD PhD ?06/07/2021 ?9:08 AM   ?

## 2021-06-07 NOTE — Transfer of Care (Signed)
Immediate Anesthesia Transfer of Care Note ? ?Patient: Christopher Richards ? ?Procedure(s) Performed: CARDIOVERSION ? ?Patient Location: Endoscopy Unit ? ?Anesthesia Type:General ? ?Level of Consciousness: awake and drowsy ? ?Airway & Oxygen Therapy: Patient Spontanous Breathing ? ?Post-op Assessment: Report given to RN and Post -op Vital signs reviewed and stable ? ?Post vital signs: Reviewed and stable ? ?Last Vitals:  ?Vitals Value Taken Time  ?BP 156/62   ?Temp    ?Pulse 69   ?Resp 14   ?SpO2 96   ? ? ?Last Pain:  ?Vitals:  ? 06/07/21 0841  ?TempSrc: Temporal  ?PainSc: 0-No pain  ?   ? ?  ? ?Complications: No notable events documented. ?

## 2021-06-07 NOTE — Discharge Instructions (Signed)

## 2021-06-07 NOTE — Anesthesia Postprocedure Evaluation (Signed)
Anesthesia Post Note ? ?Patient: Christopher Richards ? ?Procedure(s) Performed: CARDIOVERSION ? ?  ? ?Patient location during evaluation: Endoscopy ?Anesthesia Type: General ?Level of consciousness: awake and alert ?Pain management: pain level controlled ?Vital Signs Assessment: post-procedure vital signs reviewed and stable ?Respiratory status: spontaneous breathing, nonlabored ventilation, respiratory function stable and patient connected to nasal cannula oxygen ?Cardiovascular status: blood pressure returned to baseline and stable ?Postop Assessment: no apparent nausea or vomiting ?Anesthetic complications: no ? ? ?No notable events documented. ? ?Last Vitals:  ?Vitals:  ? 06/07/21 0923 06/07/21 0933  ?BP: (!) 152/70 (!) 145/64  ?Pulse: 76 73  ?Resp: (!) 22 18  ?Temp:    ?SpO2: 97% 95%  ?  ?Last Pain:  ?Vitals:  ? 06/07/21 0933  ?TempSrc:   ?PainSc: 0-No pain  ? ? ?  ?  ?  ?  ?  ?  ? ?Santa Lighter ? ? ? ? ?

## 2021-06-07 NOTE — Anesthesia Procedure Notes (Signed)
Procedure Name: General with mask airway ?Date/Time: 06/07/2021 9:02 AM ?Performed by: Dorann Lodge, CRNA ?Pre-anesthesia Checklist: Patient identified, Emergency Drugs available, Patient being monitored, Suction available and Timeout performed ?Patient Re-evaluated:Patient Re-evaluated prior to induction ?Oxygen Delivery Method: Ambu bag ?Preoxygenation: Pre-oxygenation with 100% oxygen ?Induction Type: IV induction ? ? ? ? ?

## 2021-06-07 NOTE — Interval H&P Note (Signed)
History and Physical Interval Note: ? ?06/07/2021 ?8:59 AM ? ?Christopher Richards  has presented today for surgery, with the diagnosis of AFIB.  The various methods of treatment have been discussed with the patient and family. After consideration of risks, benefits and other options for treatment, the patient has consented to  Procedure(s): ?CARDIOVERSION (N/A) as a surgical intervention.  The patient's history has been reviewed, patient examined, no change in status, stable for surgery.  I have reviewed the patient's chart and labs.  Questions were answered to the patient's satisfaction.   ? ? ?Christopher Richards ? ? ?

## 2021-06-08 NOTE — Progress Notes (Signed)
? ? ?Primary Care Physician: Binnie Rail, MD ?Primary Cardiologist: Dr Johnsie Cancel ?Primary Electrophysiologist: none ?Referring Physician: Christen Bame NP ? ? ?Christopher Richards is a 88 y.o. male with a history of HFpEF, HTN, atrial fibrillation who presents for follow up in the [facility].  The patient was initially diagnosed with atrial fibrillation 03/08/21 after presenting to his PCP with symptoms of an URI and SOB. He also had some increased lower extremity edema. ECG showed afib with slow ventricular response. Patient is on Eliquis for a CHADS2VASC score of 4.  ? ?On follow up today, patient is s/p DCCV on 06/07/21. He states that he feels much better in SR with increased energy and less tremors. He did have one episode of tremors since DCCV which lasted for several hours and then resolved. No bleeding issues on anticoagulation.  ? ?Today, he denies symptoms of palpitations, chest pain, orthopnea, PND, lower extremity edema, dizziness, presyncope, syncope, snoring, daytime somnolence, bleeding, or neurologic sequela. The patient is tolerating medications without difficulties and is otherwise without complaint today.  ? ? ?Atrial Fibrillation Risk Factors: ? ?he does not have symptoms or diagnosis of sleep apnea. ?he does not have a history of rheumatic fever. ?The patient does have a history of early familial atrial fibrillation or other arrhythmias. ? ?he has a BMI of Body mass index is 26.09 kg/m?Marland KitchenMarland Kitchen ?Filed Weights  ? 06/09/21 0825  ?Weight: 71.1 kg  ? ? ? ?Family History  ?Problem Relation Age of Onset  ? Alcohol abuse Brother   ? Hypertension Mother   ? Heart disease Mother   ?     AF  ? Transient ischemic attack Mother   ? Heart attack Father 86  ? Hypertension Father   ? Lung disease Father   ?     ? [facility] visitor)  ? Cancer Neg Hx   ? Diabetes Neg Hx   ? ? ? ?Atrial Fibrillation Management history: ? ?Previous antiarrhythmic drugs: none ?Previous cardioversions:  06/07/21 ?Previous ablations: none ?CHADS2VASC score: 4 ?Anticoagulation history: Eliquis ? ? ?Past Medical History:  ?Diagnosis Date  ? Arthritis   ? Basal cell cancer   ? SKIN.Marland KitchenFACE AND HAND  ? Benign prostatic hypertrophy   ? Bronchitis   ? 11/2013   ? Chest pain 02/19/1990  ? NEGATIVE CATH  ? Hyperglycemia   ? PMH of  ? Hyperlipidemia   ? Hypertension   ? Pneumonia 02/20/2007  ? treated as OP  ? Tachycardia   ? hx of per patient   ? ?Past Surgical History:  ?Procedure Laterality Date  ? BASAL CELL CARCINOMA EXCISION    ? X 3  ? CARDIOVERSION N/A 06/07/2021  ? Procedure: CARDIOVERSION;  Surgeon: Buford Dresser, MD;  Location[facility] ENDOSCOPY;  Service: Cardiovascular;  Laterality: N/A;  0905 shocked '@120j'$  ?0906 shocked '@150j'$ , NSR  ? cataract surgery     ? bilateral  ? COLONOSCOPY  2003  ? [facility]  ? [place]  ? Dr  Marylene Buerger  ? INGUINAL HERNIA REPAIR Right 04/06/2014  ? Procedure: REPAIR RIGHT INGUINAL HERNIA WITH MESH;  Surgeon: Jackolyn Confer, MD;  Location: [facility];  Service: General;  Laterality: Right;  ? INSERTION OF MESH Right 04/06/2014  ? Procedure: INSERTION OF MESH;  Surgeon: Jackolyn Confer, MD;  Location: [facility];  Service: General;  Laterality: Right;  ? ? ?Current Outpatient Medications  ?Medication Sig Dispense Refill  ? ADVAIR DISKUS 100-50 MCG/ACT AEPB  INHALE 1 PUFF BY MOUTH TWICE A DAY 60 each 1  ? apixaban (ELIQUIS) 5 MG TABS tablet Take 1 tablet (5 mg total) by mouth 2 (two) times daily. 60 tablet 5  ? Cholecalciferol (VITAMIN D) 2000 UNITS tablet Take 2,000 Units by mouth daily.    ? Cyanocobalamin (VITAMIN B-12 PO) Take 1 tablet by mouth daily.    ? hydrALAZINE (APRESOLINE) 25 MG tablet TAKE 1 TABLET BY MOUTH THREE TIMES A DAY 270 tablet 2  ? losartan (COZAAR) 100 MG tablet TAKE 1 TABLET BY MOUTH EVERY DAY 90 tablet 1  ? Multiple Vitamin (MULTIVITAMIN WITH MINERALS) TABS tablet Take 1 tablet by mouth 3 (three) times a week.    ? furosemide (LASIX) 20 MG tablet Take 1 tablet  (20 mg total) by mouth daily.    ? ?No current facility-administered medications for this encounter.  ? ? ?Allergies  ?Allergen Reactions  ? Azithromycin Other (See Comments)  ?  Burning in tongue  ? Amlodipine Other (See Comments)  ?  Ankle edema  ? ? ?Social History  ? ?Socioeconomic History  ? Marital status: Married  ?  Spouse name: Not on file  ? Number of children: 2  ? Years of education: 58  ? Highest education level: Not on file  ?Occupational History  ? Occupation: [employer]  ?Tobacco Use  ? Smoking status: Never  ? Smokeless tobacco: Never  ?Substance and Sexual Activity  ? Alcohol use: No  ? Drug use: No  ? Sexual activity: Not Currently  ?Other Topics Concern  ? Not on file  ?Social History Narrative  ? Denies abuse and feels safe at home.  ? ?Social Determinants of Health  ? ?Financial Resource Strain: Low Risk   ? Difficulty of Paying Living Expenses: Not hard at all  ?Food Insecurity: No Food Insecurity  ? Worried About Charity fundraiser in the Last Year: Never true  ? Ran Out of Food in the Last Year: Never true  ?Transportation Needs: No Transportation Needs  ? Lack of Transportation (Medical): No  ? Lack of Transportation (Non-Medical): No  ?Physical Activity: Sufficiently Active  ? Days of Exercise per Week: 5 days  ? Minutes of Exercise per Session: 30 min  ?Stress: No Stress Concern Present  ? Feeling of Stress : Not at all  ?Social Connections: Socially Integrated  ? Frequency of Communication with Friends and Family: More than three times a week  ? Frequency of Social Gatherings with Friends and Family: More than three times a week  ? Attends Religious Services: More than 4 times per year  ? Active Member of Clubs or Organizations: Yes  ? Attends Archivist Meetings: More than 4 times per year  ? Marital Status: Married  ?Intimate Partner Violence: Not on file  ? ? ? ?ROS- All systems are reviewed and negative except as per the HPI above. ? ?Physical Exam: ?Vitals:  ? 06/09/21 0825   ?BP: (!) 174/64  ?Pulse: 64  ?Weight: 71.1 kg  ?Height: '5\' 5"'$  (1.651 m)  ? ? ? ?GEN- The patient is a well appearing elderly male, alert and oriented x 3 today.   ?HEENT-head normocephalic, atraumatic, sclera clear, conjunctiva pink, hearing intact, trachea midline. ?Lungs- Clear to ausculation bilaterally, normal work of breathing ?Heart- slightly irregular rate and rhythm, no murmurs, rubs or gallops  ?GI- soft, NT, ND, + BS ?Extremities- no clubbing, cyanosis, or edema ?MS- no significant deformity or atrophy ?Skin- no rash or lesion ?Psych-  euthymic mood, full affect ?Neuro- strength and sensation are intact ? ? ?Wt Readings from Last 3 Encounters:  ?06/09/21 71.1 kg  ?06/07/21 72.6 kg  ?05/24/21 72.8 kg  ? ? ?EKG today demonstrates  ?SR, Mobitz I second degree AV block ?Vent. rate 64 BPM ?PR interval 296 ms ?QRS duration 88 ms ?QT/QTcB 404/416 ms ? ?Echo 03/16/21 demonstrated  ? 1. Left ventricular ejection fraction, by estimation, is 60 to 65%. The  ?left ventricle has normal function. The left ventricle has no regional  ?wall motion abnormalities. There is mild asymmetric left ventricular  ?hypertrophy of the basal-septal segment. Diastolic function indeterminant due to AFib.  ? 2. Right ventricular systolic function is normal. The right ventricular  ?size is normal. There is normal pulmonary artery systolic pressure.  ? 3. Left atrial size was mildly dilated.  ? 4. Right atrial size was mildly dilated.  ? 5. The mitral valve is normal in structure. Mild mitral valve  ?regurgitation.  ? 6. The aortic valve is tricuspid. There is mild calcification of the  ?aortic valve. There is mild thickening of the aortic valve. Aortic valve  ?regurgitation is mild. Aortic valve sclerosis/calcification is present,  ?without any evidence of aortic stenosis.  ? ?Comparison(s): Compared to prior TTE in 2018, there is no significant  ?difference.  ? ?Epic records are reviewed at length today ? ?CHA2DS2-VASc Score = 4  ?The  patient's score is based upon: ?CHF History: 1 ?HTN History: 1 ?Diabetes History: 0 ?Stroke History: 0 ?Vascular Disease History: 0 ?Age Score: 2 ?Gender Score: 0 ?    ? ? ?ASSESSMENT AND PLAN: ?1. Persiste

## 2021-06-09 ENCOUNTER — Ambulatory Visit (HOSPITAL_COMMUNITY)
Admission: RE | Admit: 2021-06-09 | Discharge: 2021-06-09 | Disposition: A | Discharge status: Home / Self Care | Payer: Medicare HMO | Source: Ambulatory Visit | Attending: Physician Assistant | Admitting: Physician Assistant

## 2021-06-09 VITALS — BP 174/64 | HR 64 | Ht 65.0 in | Wt 156.8 lb

## 2021-06-09 DIAGNOSIS — I5032 Chronic diastolic (congestive) heart failure: Secondary | ICD-10-CM | POA: Diagnosis not present

## 2021-06-09 DIAGNOSIS — I4819 Other persistent atrial fibrillation: Secondary | ICD-10-CM | POA: Insufficient documentation

## 2021-06-09 DIAGNOSIS — Z7901 Long term (current) use of anticoagulants: Secondary | ICD-10-CM | POA: Diagnosis not present

## 2021-06-09 DIAGNOSIS — D6869 Other thrombophilia: Secondary | ICD-10-CM | POA: Insufficient documentation

## 2021-06-09 DIAGNOSIS — I110 Hypertensive heart disease with heart failure: Secondary | ICD-10-CM | POA: Diagnosis not present

## 2021-06-09 DIAGNOSIS — I441 Atrioventricular block, second degree: Secondary | ICD-10-CM | POA: Insufficient documentation

## 2021-06-09 MED ORDER — FUROSEMIDE 20 MG PO TABS: 20.0000 mg | ORAL_TABLET | Freq: Every day | ORAL | Status: DC

## 2021-06-10 ENCOUNTER — Other Ambulatory Visit: Payer: Self-pay | Admitting: Internal Medicine

## 2021-06-14 ENCOUNTER — Ambulatory Visit (HOSPITAL_COMMUNITY): Payer: Medicare HMO | Admitting: Physician Assistant

## 2021-06-15 ENCOUNTER — Encounter: Payer: Self-pay | Admitting: Internal Medicine

## 2021-06-19 NOTE — Progress Notes (Signed)
? ? ?Subjective:  ? ? Patient ID: Christopher Richards, male    DOB: Aug 09, 1927, 88 y.o.   MRN: [id number] ? ?This visit occurred during the SARS-CoV-2 public health emergency.  Safety protocols were in place, including screening questions prior to the visit, additional usage of staff PPE, and extensive cleaning of exam room while observing appropriate contact time as indicated for disinfecting solutions. ? ? ? ?HPI ?Christopher Richards is here for  ?Chief Complaint  ?Patient presents with  ? Cough  ? ? ?He is here for an acute visit for cold symptoms. ? ?His symptoms started just over one week ago ? ?He is experiencing nasal discharge with white mucus, cough that is productive with gray sputum, PND, sinus pressure and some SOB.   His symptoms are not improving.   ? ?He has tried taking coricidin, nasal lavage ? ? ? ? ?Medications and allergies reviewed with patient and updated if appropriate. ? ?Current Outpatient Medications on File Prior to Visit  ?Medication Sig Dispense Refill  ? ADVAIR DISKUS 100-50 MCG/ACT AEPB TAKE 1 PUFF BY MOUTH TWICE A DAY 60 each 1  ? apixaban (ELIQUIS) 5 MG TABS tablet Take 1 tablet (5 mg total) by mouth 2 (two) times daily. 60 tablet 5  ? Cholecalciferol (VITAMIN D) 2000 UNITS tablet Take 2,000 Units by mouth daily.    ? Cyanocobalamin (VITAMIN B-12 PO) Take 1 tablet by mouth daily.    ? furosemide (LASIX) 20 MG tablet Take 1 tablet (20 mg total) by mouth daily.    ? hydrALAZINE (APRESOLINE) 25 MG tablet TAKE 1 TABLET BY MOUTH THREE TIMES A DAY 270 tablet 2  ? losartan (COZAAR) 100 MG tablet TAKE 1 TABLET BY MOUTH EVERY DAY 90 tablet 1  ? Multiple Vitamin (MULTIVITAMIN WITH MINERALS) TABS tablet Take 1 tablet by mouth 3 (three) times a week.    ? ?No current facility-administered medications on file prior to visit.  ? ? ?Review of Systems  ?Constitutional:  Negative for fever.  ?HENT:  Positive for congestion, postnasal drip and sinus pressure (occ). Negative for ear pain and sore throat.   ?Respiratory:   Positive for cough and shortness of breath. Negative for wheezing.   ?Gastrointestinal:  Negative for nausea.  ?     Increased belching  ?Neurological:  Negative for dizziness and headaches.  ? ?   ?Objective:  ? ?Vitals:  ? 06/20/21 0856  ?BP: (!) 142/80  ?Pulse: 74  ?Temp: 97.9 ?F (36.6 ?C)  ?SpO2: 97%  ? ?BP Readings from Last 3 Encounters:  ?06/20/21 (!) 142/80  ?06/09/21 (!) 174/64  ?06/07/21 (!) 145/64  ? ?Wt Readings from Last 3 Encounters:  ?06/20/21 161 lb 3.2 oz (73.1 kg)  ?06/09/21 156 lb 12.8 oz (71.1 kg)  ?06/07/21 160 lb (72.6 kg)  ? ?Body mass index is 26.83 kg/m?. ? ?  ?Physical Exam ?Constitutional:   ?   General: He is not in acute distress. ?   Appearance: Normal appearance. He is not ill-appearing.  ?HENT:  ?   Head: Normocephalic.  ?   Right Ear: Tympanic membrane, ear canal and external ear normal. There is no impacted cerumen.  ?   Left Ear: Tympanic membrane, ear canal and external ear normal. There is no impacted cerumen.  ?   Nose: Congestion present.  ?   Mouth/Throat:  ?   Mouth: Mucous membranes are dry.  ?   Pharynx: No oropharyngeal exudate or posterior oropharyngeal erythema.  ?Eyes:  ?  Conjunctiva/sclera: Conjunctivae normal.  ?Cardiovascular:  ?   Rate and Rhythm: Normal rate and regular rhythm.  ?Pulmonary:  ?   Effort: Pulmonary effort is normal. No respiratory distress.  ?   Breath sounds: Normal breath sounds. No wheezing or rales.  ?Musculoskeletal:  ?   Cervical back: Neck supple. No tenderness.  ?Lymphadenopathy:  ?   Cervical: No cervical adenopathy.  ?Skin: ?   General: Skin is warm and dry.  ?   Findings: No rash.  ?Neurological:  ?   Mental Status: He is alert.  ? ?   ? ? ? ? ? ?Assessment & Plan:  ? ? ?See Problem List for Assessment and Plan of chronic medical problems.  ? ? ? ? ?

## 2021-06-20 ENCOUNTER — Ambulatory Visit (INDEPENDENT_AMBULATORY_CARE_PROVIDER_SITE_OTHER): Payer: Medicare HMO | Admitting: Internal Medicine

## 2021-06-20 ENCOUNTER — Encounter: Payer: Self-pay | Admitting: Internal Medicine

## 2021-06-20 DIAGNOSIS — I10 Essential (primary) hypertension: Secondary | ICD-10-CM | POA: Diagnosis not present

## 2021-06-20 DIAGNOSIS — J0100 Acute maxillary sinusitis, unspecified: Secondary | ICD-10-CM | POA: Diagnosis not present

## 2021-06-20 DIAGNOSIS — J019 Acute sinusitis, unspecified: Secondary | ICD-10-CM | POA: Insufficient documentation

## 2021-06-20 MED ORDER — BENZONATATE 200 MG PO CAPS: 200.0000 mg | ORAL_CAPSULE | Freq: Three times a day (TID) | ORAL | 0 refills | Status: DC | PRN

## 2021-06-20 MED ORDER — AMOXICILLIN-POT CLAVULANATE 875-125 MG PO TABS: 1.0000 | ORAL_TABLET | Freq: Two times a day (BID) | ORAL | 0 refills | Status: DC

## 2021-06-20 NOTE — Assessment & Plan Note (Signed)
Acute ?Likely bacterial  ?Start Augmentin 875-125 mg BID x 10 day ?Tessalon perles TID prn ?otc cold medications ?Rest, fluid ?Call if no improvement ? ?

## 2021-06-20 NOTE — Patient Instructions (Addendum)
? ? ?  Medications changes include :   Augmentin and cough pills ? ? ?Your prescription(s) have been sent to your pharmacy.  ? ? ? ?Return if symptoms worsen or fail to improve. ? ?

## 2021-06-20 NOTE — Assessment & Plan Note (Addendum)
Chronic ?BP well controlled for his age ?Continue hydralazine 25 mg tid, losartan 100 mg daily ? ?

## 2021-07-10 ENCOUNTER — Ambulatory Visit (INDEPENDENT_AMBULATORY_CARE_PROVIDER_SITE_OTHER): Payer: Medicare HMO

## 2021-07-10 DIAGNOSIS — Z0000 Encounter for general adult medical examination without abnormal findings: Secondary | ICD-10-CM | POA: Diagnosis not present

## 2021-07-10 NOTE — Progress Notes (Signed)
Subjective:   Christopher Richards is a 88 y.o. male who presents for an Initial Medicare Annual Wellness Visit.   I connected with  Laurence Aly  today by telephone and verified that I am speaking with the correct person using two identifiers. Location patient: home Location provider: work Persons participating in the virtual visit: patient, provider.   I discussed the limitations, risks, security and privacy concerns of performing an evaluation and management service by telephone and the availability of in person appointments. I also discussed with the patient that there may be a patient responsible charge related to this service. The patient expressed understanding and verbally consented to this telephonic visit.    Interactive audio and video telecommunications were attempted between this provider and patient, however failed, due to patient having technical difficulties OR patient did not have access to video capability.  We continued and completed visit with audio only.    Review of Systems     Cardiac Risk Factors include: advanced age (>69mn, >>35women);male gender;hypertension     Objective:    Today's Vitals   There is no height or weight on file to calculate BMI.     07/10/2021    9:41 AM 06/07/2021    8:37 AM 07/05/2020   11:14 AM 05/22/2016    8:24 AM 03/31/2014   12:52 PM  Advanced Directives  Does Patient Have a Medical Advance Directive? Yes Yes Yes Yes Yes  Type of [place] Living will;Healthcare Power of Attorney Living will;Healthcare Power of [place] will Living will;Healthcare Power of Attorney  Does patient want to make changes to medical advance directive?   No - Patient declined  No - Patient declined  Copy of HW[place]in Chart? No - copy requested No - copy requested No - copy requested No - copy requested No - copy requested    Current Medications  (verified) Outpatient Encounter Medications as of 07/10/2021  Medication Sig   ADVAIR DISKUS 100-50 MCG/ACT AEPB TAKE 1 PUFF BY MOUTH TWICE A DAY   amoxicillin-clavulanate (AUGMENTIN) 875-125 MG tablet Take 1 tablet by mouth 2 (two) times daily.   apixaban (ELIQUIS) 5 MG TABS tablet Take 1 tablet (5 mg total) by mouth 2 (two) times daily.   benzonatate (TESSALON) 200 MG capsule Take 1 capsule (200 mg total) by mouth 3 (three) times daily as needed for cough.   Cholecalciferol (VITAMIN D) 2000 UNITS tablet Take 2,000 Units by mouth daily.   Cyanocobalamin (VITAMIN B-12 PO) Take 1 tablet by mouth daily.   furosemide (LASIX) 20 MG tablet Take 1 tablet (20 mg total) by mouth daily.   hydrALAZINE (APRESOLINE) 25 MG tablet TAKE 1 TABLET BY MOUTH THREE TIMES A DAY   losartan (COZAAR) 100 MG tablet TAKE 1 TABLET BY MOUTH EVERY DAY   Multiple Vitamin (MULTIVITAMIN WITH MINERALS) TABS tablet Take 1 tablet by mouth 3 (three) times a week.   No facility-administered encounter medications on file as of 07/10/2021.    Allergies (verified) Azithromycin and Amlodipine   History: Past Medical History:  Diagnosis Date   Arthritis    Basal cell cancer    SKIN..[place] [facility] AND HAND   Benign prostatic hypertrophy    Bronchitis    11/2013    Chest pain 02/19/1990   NEGATIVE CATH   Hyperglycemia    PMH of   Hyperlipidemia    Hypertension    Pneumonia 02/20/2007   treated as  OP   Tachycardia    hx of per patient    Past Surgical History:  Procedure Laterality Date   BASAL CELL CARCINOMA EXCISION     X 3   CARDIOVERSION N/A 06/07/2021   Procedure: CARDIOVERSION;  Surgeon: Buford Dresser, MD;  Location: [facility];  Service: Cardiovascular;  Laterality: N/A;  0905 shocked '@120j'$  0906 shocked '@150j'$ , NSR   cataract surgery      bilateral   COLONOSCOPY  2003   [facility]   HERNIA REPAIR  1972   Dr  Marylene Buerger   INGUINAL HERNIA REPAIR Right 04/06/2014   Procedure: REPAIR RIGHT INGUINAL HERNIA  WITH MESH;  Surgeon: Jackolyn Confer, MD;  Location: [facility];  Service: General;  Laterality: Right;   INSERTION OF MESH Right 04/06/2014   Procedure: INSERTION OF MESH;  Surgeon: Jackolyn Confer, MD;  Location: [facility];  Service: General;  Laterality: Right;   Family History  Problem Relation Age of Onset   Alcohol abuse Brother    Hypertension Mother    Heart disease Mother        AF   Transient ischemic attack Mother    Heart attack Father 49   Hypertension Father    Lung disease Father        ? [facility] visitor)   Cancer Neg Hx    Diabetes Neg Hx    Social History   Socioeconomic History   Marital status: Married    Spouse name: Not on file   Number of children: 2   Years of education: 18   Highest education level: Not on file  Occupational History   Occupation: [employer]  Tobacco Use   Smoking status: Never   Smokeless tobacco: Never  Substance and Sexual Activity   Alcohol use: No   Drug use: No   Sexual activity: Not Currently  Other Topics Concern   Not on file  Social History Narrative   Denies abuse and feels safe at home.   Social Determinants of Health   Financial Resource Strain: Low Risk    Difficulty of Paying Living Expenses: Not hard at all  Food Insecurity: No Food Insecurity   Worried About Charity fundraiser in the Last Year: Never true   [place] in the Last Year: Never true  Transportation Needs: No Transportation Needs   Lack of Transportation (Medical): No   Lack of Transportation (Non-Medical): No  Physical Activity: Insufficiently Active   Days of Exercise per Week: 3 days   Minutes of Exercise per Session: 40 min  Stress: No Stress Concern Present   Feeling of Stress : Not at all  Social Connections: Moderately Integrated   Frequency of Communication with Friends and Family: Three times a week   Frequency of Social Gatherings with Friends and Family: Three times a week   Attends Religious Services: More than 4 times  per year   Active Member of Clubs or Organizations: No   Attends Archivist Meetings: Never   Marital Status: Married    Tobacco Counseling Counseling given: Not Answered   Clinical Intake:  Pre-visit preparation completed: Yes  Pain : No/denies pain     Nutritional Risks: None Diabetes: No  How often do you need to have someone help you when you read instructions, pamphlets, or other written materials from your doctor or pharmacy?: 1 - Never What is the last grade level you completed in school?: MBA  Diabetic?no   Interpreter Needed?: No  Information entered by :: L.Latoia Eyster,LPN   Activities of Daily Living    07/10/2021    9:45 AM  In your present state of health, do you have any difficulty performing the following activities:  Hearing? 0  Vision? 0  Difficulty concentrating or making decisions? 0  Walking or climbing stairs? 0  Dressing or bathing? 0  Doing errands, shopping? 0  Preparing Food and eating ? N  Using the Toilet? N  In the past six months, have you accidently leaked urine? N  Do you have problems with loss of bowel control? N  Managing your Medications? N  Managing your Finances? N  Housekeeping or managing your Housekeeping? N    Patient Care Team: Binnie Rail, MD as PCP - General (Internal Medicine) Josue Hector, MD as PCP - Cardiology (Cardiology) [place], Heather, [place] as Consulting Physician (Optometry) Lucita Ferrara, Peterson Ao, MD as Consulting Physician (Ophthalmology)  Indicate any recent Medical Services you may have received from other than [facility] providers in the past year (date may be approximate).     Assessment:   This is a routine wellness examination for Damante.  Hearing/Vision screen Vision Screening - Comments:: Annual eye exams wear glasses   Dietary issues and exercise activities discussed: Current Exercise Habits: Home exercise routine, Type of exercise: walking, Time (Minutes): 40, Frequency (Times/Week): 3, Weekly  Exercise (Minutes/Week): 120, Intensity: Mild, Exercise limited by: cardiac condition(s)   Goals Addressed   None    Depression Screen    07/10/2021    9:41 AM 07/10/2021    9:39 AM 06/20/2021    9:40 AM 07/05/2020   11:21 AM 01/29/2020    9:21 AM 07/30/2018    9:52 AM 07/26/2017    8:39 AM  PHQ 2/9 Scores  PHQ - 2 Score 0 0 1 0 0 0 0    Fall Risk    07/10/2021    9:42 AM 06/20/2021    9:40 AM 07/05/2020   11:15 AM 07/30/2019    8:54 AM 07/30/2018    9:52 AM  Fall Risk   Falls in the past year? 0 0 0 0 0  Number falls in past yr: 0 0 0 0 0  Injury with Fall? 0 0 0 0   Risk for fall due to :  No Fall Risks No Fall Risks No Fall Risks   Follow up Falls evaluation completed Falls evaluation completed Falls evaluation completed Falls evaluation completed     [place]:  Any stairs in or around the home? No  If so, are there any without handrails? No  Home free of loose throw rugs in walkways, pet beds, electrical cords, etc? Yes  Adequate lighting in your home to reduce risk of falls? Yes   ASSISTIVE DEVICES UTILIZED TO PREVENT FALLS:  Life alert? No  Use of a cane, walker or w/c? No  Grab bars in the bathroom? Yes  Shower chair or bench in shower? Yes  Elevated toilet seat or a handicapped toilet? Yes     Cognitive Function:Normal cognitive status assessed by telephone conversation  by this Nurse Health Advisor. No abnormalities found.      05/22/2016    8:27 AM  MMSE - Mini Mental State Exam  Orientation to time 5  Orientation to Place 5  Registration 3  Attention/ Calculation 5  Recall 2  Language- name 2 objects 2  Language- repeat 1  Language- follow 3 step command 3  Language- read &  follow direction 1  Write a sentence 1  Copy design 1  Total score 29        Immunizations Immunization History  Administered Date(s) Administered   Fluad Quad(high Dose 65+) 12/03/2018   Influenza, High Dose Seasonal PF 11/19/2017    Influenza-Unspecified 11/24/2013, 11/17/2015, 11/26/2016, 11/20/2019   PFIZER(Purple Top)SARS-COV-2 Vaccination 03/10/2019, 03/30/2019   Pneumococcal Conjugate-13 06/23/2014   Pneumococcal Polysaccharide-23 03/09/2015   Tdap 11/21/2011    TDAP status: Up to date  Flu Vaccine status: Up to date  Pneumococcal vaccine status: Up to date  Covid-19 vaccine status: Completed vaccines  Qualifies for Shingles Vaccine? Yes   Zostavax completed No   Shingrix Completed?: No.    Education has been provided regarding the importance of this vaccine. Patient has been advised to call insurance company to determine out of pocket expense if they have not yet received this vaccine. Advised may also receive vaccine at local pharmacy or Health Dept. Verbalized acceptance and understanding.  Screening Tests Health Maintenance  Topic Date Due   Zoster Vaccines- Shingrix (1 of 2) Never done   COVID-19 Vaccine (3 - Pfizer risk series) 04/27/2019   INFLUENZA VACCINE  09/19/2021   TETANUS/TDAP  11/20/2021   Pneumonia Vaccine 81+ Years old  Completed   HPV VACCINES  Aged Out    Health Maintenance  Health Maintenance Due  Topic Date Due   Zoster Vaccines- Shingrix (1 of 2) Never done   COVID-19 Vaccine (3 - Pfizer risk series) 04/27/2019    Colorectal cancer screening: No longer required.   Lung Cancer Screening: (Low Dose CT Chest recommended if Age 80-80 years, 30 pack-year currently smoking OR have quit w/in 15years.) does not qualify.   Lung Cancer Screening Referral: n/a  Additional Screening:  Hepatitis C Screening: does not qualify;   Vision Screening: Recommended annual ophthalmology exams for early detection of glaucoma and other disorders of the eye. Is the patient up to date with their annual eye exam?  Yes  Who is the provider or what is the name of the office in which the patient attends annual eye exams? Dr. Cranford Mon  If pt is not established with a provider, would they like to be  referred to a provider to establish care? No .   Dental Screening: Recommended annual dental exams for proper oral hygiene  Community Resource Referral / Chronic Care Management: CRR required this visit?  No   CCM required this visit?  No      Plan:     I have personally reviewed and noted the following in the patient's chart:   Medical and social history Use of alcohol, tobacco or illicit drugs  Current medications and supplements including opioid prescriptions. Patient is not currently taking opioid prescriptions. Functional ability and status Nutritional status Physical activity Advanced directives List of other physicians Hospitalizations, surgeries, and ER visits in previous 12 months Vitals Screenings to include cognitive, depression, and falls Referrals and appointments  In addition, I have reviewed and discussed with patient certain preventive protocols, quality metrics, and best practice recommendations. A written personalized care plan for preventive services as well as general preventive health recommendations were provided to patient.     Randel Pigg, LPN   [id number]   Nurse Notes: none

## 2021-07-10 NOTE — Patient Instructions (Signed)
Christopher Richards , Thank you for taking time to come for your Medicare Wellness Visit. I appreciate your ongoing commitment to your health goals. Please review the following plan we discussed and let me know if I can assist you in the future.   Screening recommendations/referrals: Colonoscopy:  no longer required  Recommended yearly ophthalmology/optometry visit for glaucoma screening and checkup Recommended yearly dental visit for hygiene and checkup  Vaccinations: Influenza vaccine: completed  Pneumococcal vaccine: completed  Tdap vaccine: 12/10/2011 Shingles vaccine: declined     Advanced directives: yes   Conditions/risks identified: none   Next appointment: none   Preventive Care 27 Years and Older, Male Preventive care refers to lifestyle choices and visits with your health care provider that can promote health and wellness. What does preventive care include? A yearly physical exam. This is also called an annual well check. Dental exams once or twice a year. Routine eye exams. Ask your health care provider how often you should have your eyes checked. Personal lifestyle choices, including: Daily care of your teeth and gums. Regular physical activity. Eating a healthy diet. Avoiding tobacco and drug use. Limiting alcohol use. Practicing safe sex. Taking low doses of aspirin every day. Taking vitamin and mineral supplements as recommended by your health care provider. What happens during an annual well check? The services and screenings done by your health care provider during your annual well check will depend on your age, overall health, lifestyle risk factors, and family history of disease. Counseling  Your health care provider may ask you questions about your: Alcohol use. Tobacco use. Drug use. Emotional well-being. Home and relationship well-being. Sexual activity. Eating habits. History of falls. Memory and ability to understand (cognition). Work and work  Statistician. Screening  You may have the following tests or measurements: Height, weight, and BMI. Blood pressure. Lipid and cholesterol levels. These may be checked every 5 years, or more frequently if you are over 49 years old. Skin check. Lung cancer screening. You may have this screening every year starting at age 9 if you have a 30-pack-year history of smoking and currently smoke or have quit within the past 15 years. Fecal occult blood test (FOBT) of the stool. You may have this test every year starting at age 16. Flexible sigmoidoscopy or colonoscopy. You may have a sigmoidoscopy every 5 years or a colonoscopy every 10 years starting at age 93. Prostate cancer screening. Recommendations will vary depending on your family history and other risks. Hepatitis C blood test. Hepatitis B blood test. Sexually transmitted disease (STD) testing. Diabetes screening. This is done by checking your blood sugar (glucose) after you have not eaten for a while (fasting). You may have this done every 1-3 years. Abdominal aortic aneurysm (AAA) screening. You may need this if you are a current or former smoker. Osteoporosis. You may be screened starting at age 40 if you are at high risk. Talk with your health care provider about your test results, treatment options, and if necessary, the need for more tests. Vaccines  Your health care provider may recommend certain vaccines, such as: Influenza vaccine. This is recommended every year. Tetanus, diphtheria, and acellular pertussis (Tdap, Td) vaccine. You may need a Td booster every 10 years. Zoster vaccine. You may need this after age 108. Pneumococcal 13-valent conjugate (PCV13) vaccine. One dose is recommended after age 17. Pneumococcal polysaccharide (PPSV23) vaccine. One dose is recommended after age 7. Talk to your health care provider about which screenings and vaccines you need and  how often you need them. This information is not intended to replace  advice given to you by your health care provider. Make sure you discuss any questions you have with your health care provider. Document Released: 03/04/2015 Document Revised: 10/26/2015 Document Reviewed: 12/07/2014 Elsevier Interactive Patient Education  2017 [place] Prevention in the Home Falls can cause injuries. They can happen to people of all ages. There are many things you can do to make your home safe and to help prevent falls. What can I do on the outside of my home? Regularly fix the edges of walkways and driveways and fix any cracks. Remove anything that might make you trip as you walk through a door, such as a raised step or threshold. Trim any bushes or trees on the path to your home. Use bright outdoor lighting. Clear any walking paths of anything that might make someone trip, such as rocks or tools. Regularly check to see if handrails are loose or broken. Make sure that both sides of any steps have handrails. Any raised decks and porches should have guardrails on the edges. Have any leaves, snow, or ice cleared regularly. Use sand or salt on walking paths during winter. Clean up any spills in your garage right away. This includes oil or grease spills. What can I do in the bathroom? Use night lights. Install grab bars by the toilet and in the tub and shower. Do not use towel bars as grab bars. Use non-skid mats or decals in the tub or shower. If you need to sit down in the shower, use a plastic, non-slip stool. Keep the floor dry. Clean up any water that spills on the floor as soon as it happens. Remove soap buildup in the tub or shower regularly. Attach bath mats securely with double-sided non-slip rug tape. Do not have throw rugs and other things on the floor that can make you trip. What can I do in the bedroom? Use night lights. Make sure that you have a light by your bed that is easy to reach. Do not use any sheets or blankets that are too big for your bed.  They should not hang down onto the floor. Have a firm chair that has side arms. You can use this for support while you get dressed. Do not have throw rugs and other things on the floor that can make you trip. What can I do in the kitchen? Clean up any spills right away. Avoid walking on wet floors. Keep items that you use a lot in easy-to-reach places. If you need to reach something above you, use a strong step stool that has a grab bar. Keep electrical cords out of the way. Do not use floor polish or wax that makes floors slippery. If you must use wax, use non-skid floor wax. Do not have throw rugs and other things on the floor that can make you trip. What can I do with my stairs? Do not leave any items on the stairs. Make sure that there are handrails on both sides of the stairs and use them. Fix handrails that are broken or loose. Make sure that handrails are as long as the stairways. Check any carpeting to make sure that it is firmly attached to the stairs. Fix any carpet that is loose or worn. Avoid having throw rugs at the top or bottom of the stairs. If you do have throw rugs, attach them to the floor with carpet tape. Make sure that you have  a light switch at the top of the stairs and the bottom of the stairs. If you do not have them, ask someone to add them for you. What else can I do to help prevent falls? Wear shoes that: Do not have high heels. Have rubber bottoms. Are comfortable and fit you well. Are closed at the toe. Do not wear sandals. If you use a stepladder: Make sure that it is fully opened. Do not climb a closed stepladder. Make sure that both sides of the stepladder are locked into place. Ask someone to hold it for you, if possible. Clearly mark and make sure that you can see: Any grab bars or handrails. First and last steps. Where the edge of each step is. Use tools that help you move around (mobility aids) if they are needed. These  include: Canes. Walkers. Scooters. Crutches. Turn on the lights when you go into a dark area. Replace any light bulbs as soon as they burn out. Set up your furniture so you have a clear path. Avoid moving your furniture around. If any of your floors are uneven, fix them. If there are any pets around you, be aware of where they are. Review your medicines with your doctor. Some medicines can make you feel dizzy. This can increase your chance of falling. Ask your doctor what other things that you can do to help prevent falls. This information is not intended to replace advice given to you by your health care provider. Make sure you discuss any questions you have with your health care provider. Document Released: 12/02/2008 Document Revised: 07/14/2015 Document Reviewed: 03/12/2014 Elsevier Interactive Patient Education  2017 [employer].

## 2021-07-31 ENCOUNTER — Encounter: Payer: Self-pay | Admitting: Internal Medicine

## 2021-07-31 NOTE — Patient Instructions (Addendum)
        Medications changes include :   None     Return in about 6 months (around 01/31/2022) for follow up.

## 2021-07-31 NOTE — Progress Notes (Signed)
Subjective:    Patient ID: Christopher Richards, male    DOB: [birth year], 88 y.o.   MRN: [id number]     HPI Christopher Richards is here for follow up of his chronic medical problems, including htn, afib, hld, prediabetes  Neck cardioversion 05/2021.  Feels he has been going in and out of atrial fibrillation since then.  He does feel tired and short of breath and knows that could be because of the A-fib, but also because he is not moving around as much.  He is more sedentary.  had blood work done 05/2021 at time of cardioversion-okay to hold off  -- going in and out of afib    Medications and allergies reviewed with patient and updated if appropriate.  Current Outpatient Medications on File Prior to Visit  Medication Sig Dispense Refill   ADVAIR DISKUS 100-50 MCG/ACT AEPB TAKE 1 PUFF BY MOUTH TWICE A DAY 60 each 1   apixaban (ELIQUIS) 5 MG TABS tablet Take 1 tablet (5 mg total) by mouth 2 (two) times daily. 60 tablet 5   benzonatate (TESSALON) 200 MG capsule Take 1 capsule (200 mg total) by mouth 3 (three) times daily as needed for cough. 30 capsule 0   Cholecalciferol (VITAMIN D) 2000 UNITS tablet Take 2,000 Units by mouth daily.     Cyanocobalamin (VITAMIN B-12 PO) Take 1 tablet by mouth daily.     furosemide (LASIX) 20 MG tablet Take 1 tablet (20 mg total) by mouth daily.     hydrALAZINE (APRESOLINE) 25 MG tablet TAKE 1 TABLET BY MOUTH THREE TIMES A DAY 270 tablet 2   losartan (COZAAR) 100 MG tablet TAKE 1 TABLET BY MOUTH EVERY DAY 90 tablet 1   Multiple Vitamin (MULTIVITAMIN WITH MINERALS) TABS tablet Take 1 tablet by mouth 3 (three) times a week.     No current facility-administered medications on file prior to visit.     Review of Systems  Constitutional:  Negative for fever.  Respiratory:  Positive for shortness of breath. Negative for cough and wheezing.   Cardiovascular:  Positive for leg swelling (mild). Negative for chest pain and palpitations.  Neurological:  Negative for  light-headedness and headaches.       Objective:   Vitals:   08/01/21 1052  BP: 118/70  Pulse: 69  Temp: 98 F (36.7 C)  SpO2: 99%   BP Readings from Last 3 Encounters:  08/01/21 118/70  06/20/21 (!) 142/80  06/09/21 (!) 174/64   Wt Readings from Last 3 Encounters:  08/01/21 158 lb (71.7 kg)  06/20/21 161 lb 3.2 oz (73.1 kg)  06/09/21 156 lb 12.8 oz (71.1 kg)   Body mass index is 26.29 kg/m.    Physical Exam Constitutional:      General: He is not in acute distress.    Appearance: Normal appearance. He is not ill-appearing.  HENT:     Head: Normocephalic and atraumatic.  Eyes:     Conjunctiva/sclera: Conjunctivae normal.  Cardiovascular:     Rate and Rhythm: Normal rate and regular rhythm.     Heart sounds: Normal heart sounds. No murmur heard. Pulmonary:     Effort: Pulmonary effort is normal. No respiratory distress.     Breath sounds: Normal breath sounds. No wheezing or rales.  Musculoskeletal:     Right lower leg: No edema.     Left lower leg: No edema.  Skin:    General: Skin is warm and dry.     Findings: No  rash.  Neurological:     Mental Status: He is alert. Mental status is at baseline.  Psychiatric:        Mood and Affect: Mood normal.        Lab Results  Component Value Date   WBC 8.3 05/24/2021   HGB 15.0 05/24/2021   HCT 44.8 05/24/2021   PLT 248 05/24/2021   GLUCOSE 102 (H) 05/24/2021   CHOL 175 01/29/2020   TRIG 110.0 01/29/2020   HDL 37.00 (L) 01/29/2020   LDLCALC 116 (H) 01/29/2020   ALT 18 03/08/2021   AST 19 03/08/2021   NA 138 05/24/2021   K 4.3 05/24/2021   CL 108 05/24/2021   CREATININE 1.39 (H) 05/24/2021   BUN 20 05/24/2021   CO2 24 05/24/2021   TSH 1.19 03/08/2021   PSA 2.11 08/27/2006   INR 1.02 03/31/2014   HGBA1C 5.6 01/30/2021   MICROALBUR 3.3 (H) 05/01/2012     Assessment & Plan:    See Problem List for Assessment and Plan of chronic medical problems.

## 2021-08-01 ENCOUNTER — Ambulatory Visit (INDEPENDENT_AMBULATORY_CARE_PROVIDER_SITE_OTHER): Payer: Medicare HMO | Admitting: Internal Medicine

## 2021-08-01 VITALS — BP 118/70 | HR 69 | Temp 98.0°F | Ht 65.0 in | Wt 158.0 lb

## 2021-08-01 DIAGNOSIS — I10 Essential (primary) hypertension: Secondary | ICD-10-CM

## 2021-08-01 DIAGNOSIS — R7303 Prediabetes: Secondary | ICD-10-CM | POA: Diagnosis not present

## 2021-08-01 DIAGNOSIS — E7849 Other hyperlipidemia: Secondary | ICD-10-CM

## 2021-08-01 DIAGNOSIS — J4530 Mild persistent asthma, uncomplicated: Secondary | ICD-10-CM

## 2021-08-01 DIAGNOSIS — I4819 Other persistent atrial fibrillation: Secondary | ICD-10-CM | POA: Diagnosis not present

## 2021-08-01 NOTE — Assessment & Plan Note (Addendum)
Chronic Managed by cardiology S/p cardioversion 05/2021-was in sinus following that On Eliquis 5 mg twice daily Avoid AV nodal blocking meds due to conduction disease

## 2021-08-01 NOTE — Assessment & Plan Note (Signed)
Chronic Lifestyle controlled Not currently on any medication

## 2021-08-01 NOTE — Assessment & Plan Note (Signed)
Chronic Lab Results  Component Value Date   HGBA1C 5.6 01/30/2021   Low risk Continue low sugar diet

## 2021-08-01 NOTE — Assessment & Plan Note (Signed)
Chronic Controlled Probable asthma Continue Advair 100-50 mg twice daily

## 2021-08-01 NOTE — Assessment & Plan Note (Signed)
Chronic Blood pressure well controlled CMP Continue hydralazine 25 mg 3 times daily, losartan 100 mg daily

## 2021-08-03 ENCOUNTER — Other Ambulatory Visit: Payer: Self-pay | Admitting: Internal Medicine

## 2021-08-10 ENCOUNTER — Other Ambulatory Visit: Payer: Self-pay | Admitting: Internal Medicine

## 2021-09-08 ENCOUNTER — Encounter (HOSPITAL_COMMUNITY): Payer: Self-pay | Admitting: Physician Assistant

## 2021-09-08 ENCOUNTER — Ambulatory Visit (HOSPITAL_COMMUNITY)
Admission: RE | Admit: 2021-09-08 | Discharge: 2021-09-08 | Disposition: A | Discharge status: Home / Self Care | Payer: Medicare HMO | Source: Ambulatory Visit | Attending: Physician Assistant | Admitting: Physician Assistant

## 2021-09-08 VITALS — BP 158/90 | HR 61 | Ht 65.0 in | Wt 160.8 lb

## 2021-09-08 DIAGNOSIS — I4819 Other persistent atrial fibrillation: Secondary | ICD-10-CM

## 2021-09-08 DIAGNOSIS — I110 Hypertensive heart disease with heart failure: Secondary | ICD-10-CM | POA: Diagnosis not present

## 2021-09-08 DIAGNOSIS — I5032 Chronic diastolic (congestive) heart failure: Secondary | ICD-10-CM | POA: Diagnosis not present

## 2021-09-08 DIAGNOSIS — I441 Atrioventricular block, second degree: Secondary | ICD-10-CM | POA: Diagnosis not present

## 2021-09-08 DIAGNOSIS — D6869 Other thrombophilia: Secondary | ICD-10-CM

## 2021-09-08 DIAGNOSIS — Z8249 Family history of ischemic heart disease and other diseases of the circulatory system: Secondary | ICD-10-CM | POA: Insufficient documentation

## 2021-09-08 DIAGNOSIS — Z7901 Long term (current) use of anticoagulants: Secondary | ICD-10-CM | POA: Diagnosis not present

## 2021-09-08 NOTE — Progress Notes (Signed)
Primary Care Physician: Binnie Rail, MD Primary Cardiologist: Dr Johnsie Cancel Primary Electrophysiologist: none Referring Physician: Christen Bame NP   Christopher Richards is a 88 y.o. male with a history of HFpEF, HTN, atrial fibrillation who presents for follow up in the [facility].  The patient was initially diagnosed with atrial fibrillation 03/08/21 after presenting to his PCP with symptoms of an URI and SOB. He also had some increased lower extremity edema. ECG showed afib with slow ventricular response. Patient is on Eliquis for a CHADS2VASC score of 4. Patient is s/p DCCV on 06/07/21. He states that he feels much better in SR with increased energy and less tremors.   On follow up today, patient reports that he feels SOB with exertion but admits that he is not able to do much because he cares for his wife with dementia. He is back in rate controlled afib today. He is not sure when he went back into afib. No bleeding issues on anticoagulation.   Today, he denies symptoms of palpitations, chest pain, orthopnea, PND, lower extremity edema, dizziness, presyncope, syncope, snoring, daytime somnolence, bleeding, or neurologic sequela. The patient is tolerating medications without difficulties and is otherwise without complaint today.    Atrial Fibrillation Risk Factors:  he does not have symptoms or diagnosis of sleep apnea. he does not have a history of rheumatic fever. The patient does have a history of early familial atrial fibrillation or other arrhythmias.  he has a BMI of Body mass index is 26.76 kg/m.Marland Kitchen Filed Weights   09/08/21 0837  Weight: 72.9 kg    Family History  Problem Relation Age of Onset   Alcohol abuse Brother    Hypertension Mother    Heart disease Mother        AF   Transient ischemic attack Mother    Heart attack Father 27   Hypertension Father    Lung disease Father        ? [facility] visitor)   Cancer Neg Hx    Diabetes  Neg Hx      Atrial Fibrillation Management history:  Previous antiarrhythmic drugs: none Previous cardioversions: 06/07/21 Previous ablations: none CHADS2VASC score: 4 Anticoagulation history: Eliquis   Past Medical History:  Diagnosis Date   Arthritis    Basal cell cancer    SKIN.Marland KitchenFACE AND HAND   Benign prostatic hypertrophy    Bronchitis    11/2013    Chest pain 02/19/1990   NEGATIVE CATH   Hyperglycemia    PMH of   Hyperlipidemia    Hypertension    Pneumonia 02/20/2007   treated as OP   Tachycardia    hx of per patient    Past Surgical History:  Procedure Laterality Date   BASAL CELL CARCINOMA EXCISION     X 3   CARDIOVERSION N/A 06/07/2021   Procedure: CARDIOVERSION;  Surgeon: Buford Dresser, MD;  Location: [facility];  Service: Cardiovascular;  Laterality: N/A;  0905 shocked '@120j'$  0906 shocked '@150j'$ , NSR   cataract surgery      bilateral   COLONOSCOPY  2003   [facility]   HERNIA REPAIR  1972   Dr  Marylene Buerger   INGUINAL HERNIA REPAIR Right 04/06/2014   Procedure: REPAIR RIGHT INGUINAL HERNIA WITH MESH;  Surgeon: Jackolyn Confer, MD;  Location: [facility];  Service: General;  Laterality: Right;   INSERTION OF MESH Right 04/06/2014   Procedure: INSERTION OF MESH;  Surgeon: Jackolyn Confer, MD;  Location:  [facility];  Service: General;  Laterality: Right;    Current Outpatient Medications  Medication Sig Dispense Refill   ADVAIR DISKUS 100-50 MCG/ACT AEPB INHALE 1 PUFF BY MOUTH TWICE A DAY 60 each 1   apixaban (ELIQUIS) 5 MG TABS tablet Take 1 tablet (5 mg total) by mouth 2 (two) times daily. 60 tablet 5   Cholecalciferol (VITAMIN D) 2000 UNITS tablet Take 2,000 Units by mouth daily.     Cyanocobalamin (VITAMIN B-12 PO) Take 1 tablet by mouth daily.     furosemide (LASIX) 20 MG tablet TAKE 2 TABS DAILY X 2 DAYS AND THEN 1 TAB DAILY 90 tablet 1   hydrALAZINE (APRESOLINE) 25 MG tablet TAKE 1 TABLET BY MOUTH THREE TIMES A DAY 270 tablet 2   losartan (COZAAR)  100 MG tablet TAKE 1 TABLET BY MOUTH EVERY DAY 90 tablet 1   Multiple Vitamin (MULTIVITAMIN WITH MINERALS) TABS tablet Take 1 tablet by mouth 3 (three) times a week.     benzonatate (TESSALON) 200 MG capsule Take 1 capsule (200 mg total) by mouth 3 (three) times daily as needed for cough. (Patient not taking: Reported on 09/08/2021) 30 capsule 0   No current facility-administered medications for this encounter.    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   Social Determinants of Health   Financial Resource Strain: Low Risk  (07/10/2021)   Overall Financial Resource Strain (CARDIA)    Difficulty of Paying Living Expenses: Not hard at all  Food Insecurity: No Food Insecurity (07/10/2021)   Hunger Vital Sign    Worried About Running Out of Food in the Last Year: Never true    Ran Out of Food in the Last Year: Never true  Transportation Needs: No Transportation Needs (07/10/2021)   PRAPARE - Hydrologist (Medical): No    Lack of Transportation (Non-Medical): No  Physical Activity: Insufficiently Active (07/10/2021)   Exercise Vital Sign    Days of Exercise per Week: 3 days    Minutes of Exercise per Session: 40 min  Stress: No Stress Concern Present (07/10/2021)   Christopher    Feeling of Stress : Not at  all  Social Connections: Moderately Integrated (07/10/2021)   Social Connection and Isolation Panel [NHANES]    Frequency of Communication with Friends and Family: Three times a week    Frequency of Social Gatherings with Friends and Family: Three times a week    Attends Religious Services: More than 4 times per year    Active Member of Clubs or Organizations: No    Attends Archivist Meetings: Never    Marital Status: Married  Human resources officer Violence: Not At Risk (07/10/2021)   Humiliation, Afraid,  Rape, and Kick questionnaire    Fear of Current or Ex-Partner: No    Emotionally Abused: No    Physically Abused: No    Sexually Abused: No     ROS- All systems are reviewed and negative except as per the HPI above.  Physical Exam: Vitals:   09/08/21 0837  BP: (!) 158/90  Pulse: 61  Weight: 72.9 kg  Height: '5\' 5"'$  (1.651 m)     GEN- The patient is a well appearing elderly male, alert and oriented x 3 today.   HEENT-head normocephalic, atraumatic, sclera clear, conjunctiva pink, hearing intact, trachea midline. Lungs- Clear to ausculation bilaterally, normal work of breathing Heart- irregular rate and rhythm, no murmurs, rubs or gallops  GI- soft, NT, ND, + BS Extremities- no clubbing, cyanosis, or edema MS- no significant deformity or atrophy Skin- no rash or lesion Psych- euthymic mood, full affect Neuro- strength and sensation are intact   Wt Readings from Last 3 Encounters:  09/08/21 72.9 kg  08/01/21 71.7 kg  06/20/21 73.1 kg    EKG today demonstrates  Afib Vent. rate 61 BPM PR interval * ms QRS duration 84 ms QT/QTcB 442/444 ms  Echo 03/16/21 demonstrated   1. Left ventricular ejection fraction, by estimation, is 60 to 65%. The  left ventricle has normal function. The left ventricle has no regional  wall motion abnormalities. There is mild asymmetric left ventricular  hypertrophy of the basal-septal segment. Diastolic function indeterminant due to  AFib.   2. Right ventricular systolic function is normal. The right ventricular  size is normal. There is normal pulmonary artery systolic pressure.   3. Left atrial size was mildly dilated.   4. Right atrial size was mildly dilated.   5. The mitral valve is normal in structure. Mild mitral valve  regurgitation.   6. The aortic valve is tricuspid. There is mild calcification of the  aortic valve. There is mild thickening of the aortic valve. Aortic valve  regurgitation is mild. Aortic valve sclerosis/calcification is present,  without any evidence of aortic stenosis.   Comparison(s): Compared to prior TTE in 2018, there is no significant  difference.   Epic records are reviewed at length today  CHA2DS2-VASc Score = 4  The patient's score is based upon: CHF History: 1 HTN History: 1 Diabetes History: 0 Stroke History: 0 Vascular Disease History: 0 Age Score: 2 Gender Score: 0       ASSESSMENT AND PLAN: 1. Persistent Atrial Fibrillation (ICD10:  I48.19) The patient's CHA2DS2-VASc score is 4, indicating a 4.8% annual risk of stroke.   Patient back in rate controlled afib. We discussed rate vs rhythm control today. He would prefer to try a rhythm control strategy. Unfortunately, his medication options are very limited with his baseline 2nd degree AV block. Will discuss options with EP. Patient may opt for DCCV alone if not felt to be an AAD candidate or pursue rate control.  Continue Eliquis 5 mg BID  2. Secondary Hypercoagulable State (ICD10:  D68.69) The patient is at significant risk for stroke/thromboembolism based upon his CHA2DS2-VASc Score of 4.  Continue Apixaban (Eliquis).   3. HTN Elevated today, controlled at home. No changes today.  4. Chronic HFpEF Dry weight ~ 155-160 lbs Appears euvolemic today.  5. Second degree AV block Mobitz I Asymptomatic, heart rate appropriate Avoid AV nodal agents.    Follow up in the AF clinic pending decision about  AAD/DCCV.   [facility] [address] [place], [place] [address] [phone] 09/08/2021 8:45 AM

## 2021-10-06 ENCOUNTER — Other Ambulatory Visit: Payer: Self-pay | Admitting: Internal Medicine

## 2021-10-09 ENCOUNTER — Other Ambulatory Visit (HOSPITAL_COMMUNITY): Payer: Self-pay | Admitting: *Deleted

## 2021-10-09 ENCOUNTER — Encounter: Payer: Self-pay | Admitting: *Deleted

## 2021-10-09 ENCOUNTER — Telehealth: Payer: Self-pay | Admitting: *Deleted

## 2021-10-09 DIAGNOSIS — I4819 Other persistent atrial fibrillation: Secondary | ICD-10-CM

## 2021-10-09 NOTE — Patient Outreach (Signed)
  Care Coordination   10/09/2021 Name: Christopher Richards MRN: [id number] DOB: [birth year]   Care Coordination Outreach Attempts:  An unsuccessful telephone outreach was attempted today to offer the patient information about available care coordination services as a benefit of their health plan.   Follow Up Plan:  Additional outreach attempts will be made to offer the patient care coordination information and services.   Encounter Outcome:  No Answer phone rang without physical or voice mail pick up  Care Coordination Interventions Activated:  No   Care Coordination Interventions:  No, not indicated Unsuccessful outreach attempt    Oneta Rack, RN, BSN, [facility] [phone]: direct office

## 2021-10-10 ENCOUNTER — Other Ambulatory Visit (HOSPITAL_COMMUNITY): Payer: Self-pay | Admitting: *Deleted

## 2021-10-12 ENCOUNTER — Ambulatory Visit: Payer: Self-pay | Admitting: *Deleted

## 2021-10-12 ENCOUNTER — Ambulatory Visit (HOSPITAL_COMMUNITY): Payer: Medicare HMO | Admitting: Nurse Practitioner

## 2021-10-12 ENCOUNTER — Encounter: Payer: Self-pay | Admitting: *Deleted

## 2021-10-12 ENCOUNTER — Other Ambulatory Visit: Payer: Self-pay | Admitting: Internal Medicine

## 2021-10-12 NOTE — Patient Instructions (Signed)
Visit Information  Thank you for taking time to visit with me today. Please don't hesitate to contact me if I can be of assistance to you.   Following are the goals we discussed today:   Goals Addressed             This Visit's Progress    COMPLETED: Care Coordination Activities- no follow up required       Care Coordination Interventions: Evaluation of current treatment plan related to A-Fib and patient's adherence to plan as established by provider Advised patient to provide appropriate vaccination information to provider or CM team member at next visit Reviewed scheduled/upcoming provider appointments including 10/30/21- outpatient cardioversion Advised patient to discuss need for annual flu vaccine; confirmed Medicare Annual Wellness Visit completed 07/10/21 with provider Assessed social determinant of health barriers Discussed signs/ symptoms A-Fib with patient; he reports he is currently in A-Fib, with no symptoms; "feels fine;" confirms taking ACT as prescribed; confirmed he is able to verbalize action plan for development of signs/ symptoms A-Fib, along with corresponding action plan          If you are experiencing a Mental Health or [place] or need someone to talk to, please  call the Suicide and Crisis Lifeline: 988 call the [place] National Suicide Prevention Lifeline: 540-418-8816 or TTY: 954-877-8293 TTY 856 180 3661) to talk to a trained counselor call 1-800-273-TALK (toll free, 24 hour hotline) go to [facility] [address] [place] [phone]) call the [place]: [phone] call 911   Patient verbalizes understanding of instructions and care plan provided today and agrees to view in [place]. Active MyChart status and patient understanding of how to access instructions and care plan via MyChart confirmed with patient.     No further follow up required: patient denies current care  coordination needs  Oneta Rack, RN, BSN, [facility] [phone]: direct office

## 2021-10-12 NOTE — Patient Outreach (Signed)
  Care Coordination   Initial Visit Note   10/12/2021 Name: Christopher Richards MRN: [id number] DOB: Nov 20, 1927  Christopher Richards is a 88 y.o. year old male who sees Burns, Christopher Lick, MD for primary care. I spoke with  Christopher Richards by phone today  What matters to the patients health and wellness today?  "Things are going okay; I am still in A-Fib and they have decided to do another cardioversion in a couple of weeks; I can't even tell I am in A-Fib, I feel fine and normal really; my daughters are living here with my wife and I and they are taking good care of [name] and managing our medical affairs; we are getting along fine"    Goals Addressed             This Visit's Progress    COMPLETED: Care Coordination Activities- no follow up required       Care Coordination Interventions: Evaluation of current treatment plan related to A-Fib and patient's adherence to plan as established by provider Advised patient to provide appropriate vaccination information to provider or CM team member at next visit Reviewed scheduled/upcoming provider appointments including 10/30/21- outpatient cardioversion Advised patient to discuss need for annual flu vaccine; confirmed Medicare Annual Wellness Visit completed 07/10/21 with provider Assessed social determinant of health barriers Discussed signs/ symptoms A-Fib with patient; he reports he is currently in A-Fib, with no symptoms; "feels fine;" confirms taking ACT as prescribed; confirmed he is able to verbalize action plan for development of signs/ symptoms A-Fib, along with corresponding action plan          SDOH assessments and interventions completed:  Yes  SDOH Interventions Today    Flowsheet Row Most Recent Value  SDOH Interventions   Food Insecurity Interventions Intervention Not Indicated  Transportation Interventions Intervention Not Indicated  [patient drives short distances and his daughter's provide transportation otherwise and attend provider  appointments with patient]       Care Coordination Interventions Activated:  Yes  Care Coordination Interventions:  Yes, provided   Follow up plan: No further intervention required.   Encounter Outcome:  Pt. Visit Completed   Oneta Rack, RN, BSN, [facility] [phone]: direct office

## 2021-10-18 DIAGNOSIS — H35371 Puckering of macula, right eye: Secondary | ICD-10-CM | POA: Diagnosis not present

## 2021-10-18 DIAGNOSIS — H5203 Hypermetropia, bilateral: Secondary | ICD-10-CM | POA: Diagnosis not present

## 2021-10-20 ENCOUNTER — Encounter (HOSPITAL_COMMUNITY): Payer: Self-pay | Admitting: Internal Medicine

## 2021-10-30 ENCOUNTER — Ambulatory Visit (HOSPITAL_COMMUNITY)
Admission: RE | Admit: 2021-10-30 | Discharge: 2021-10-30 | Disposition: A | Discharge status: Home / Self Care | Payer: Medicare HMO | Source: Ambulatory Visit | Attending: Internal Medicine | Admitting: Internal Medicine

## 2021-10-30 ENCOUNTER — Ambulatory Visit (HOSPITAL_BASED_OUTPATIENT_CLINIC_OR_DEPARTMENT_OTHER): Payer: Medicare HMO | Admitting: Anesthesiology

## 2021-10-30 ENCOUNTER — Encounter (HOSPITAL_COMMUNITY): Payer: Self-pay | Admitting: Internal Medicine

## 2021-10-30 ENCOUNTER — Encounter (HOSPITAL_COMMUNITY)
Admission: RE | Disposition: A | Discharge status: Home / Self Care | Payer: Self-pay | Source: Ambulatory Visit | Attending: Internal Medicine

## 2021-10-30 ENCOUNTER — Emergency Department (HOSPITAL_BASED_OUTPATIENT_CLINIC_OR_DEPARTMENT_OTHER)
Admission: EM | Admit: 2021-10-30 | Discharge: 2021-10-30 | Disposition: A | Discharge status: Home / Self Care | Payer: Medicare HMO | Source: Home / Self Care | Attending: Emergency Medicine | Admitting: Emergency Medicine

## 2021-10-30 ENCOUNTER — Other Ambulatory Visit: Payer: Self-pay

## 2021-10-30 ENCOUNTER — Encounter (HOSPITAL_BASED_OUTPATIENT_CLINIC_OR_DEPARTMENT_OTHER): Payer: Self-pay

## 2021-10-30 ENCOUNTER — Ambulatory Visit (HOSPITAL_COMMUNITY): Payer: Medicare HMO | Admitting: Anesthesiology

## 2021-10-30 ENCOUNTER — Ambulatory Visit (HOSPITAL_BASED_OUTPATIENT_CLINIC_OR_DEPARTMENT_OTHER)
Admission: RE | Admit: 2021-10-30 | Discharge: 2021-10-30 | Disposition: A | Discharge status: Home / Self Care | Payer: Medicare HMO | Source: Ambulatory Visit | Attending: Nurse Practitioner | Admitting: Nurse Practitioner

## 2021-10-30 VITALS — BP 184/66 | HR 56 | Ht 65.0 in | Wt 155.8 lb

## 2021-10-30 DIAGNOSIS — R338 Other retention of urine: Secondary | ICD-10-CM | POA: Diagnosis not present

## 2021-10-30 DIAGNOSIS — I4819 Other persistent atrial fibrillation: Secondary | ICD-10-CM | POA: Insufficient documentation

## 2021-10-30 DIAGNOSIS — Z79899 Other long term (current) drug therapy: Secondary | ICD-10-CM | POA: Insufficient documentation

## 2021-10-30 DIAGNOSIS — Z7901 Long term (current) use of anticoagulants: Secondary | ICD-10-CM | POA: Insufficient documentation

## 2021-10-30 DIAGNOSIS — D6869 Other thrombophilia: Secondary | ICD-10-CM | POA: Diagnosis not present

## 2021-10-30 DIAGNOSIS — K449 Diaphragmatic hernia without obstruction or gangrene: Secondary | ICD-10-CM

## 2021-10-30 DIAGNOSIS — I10 Essential (primary) hypertension: Secondary | ICD-10-CM

## 2021-10-30 DIAGNOSIS — N401 Enlarged prostate with lower urinary tract symptoms: Secondary | ICD-10-CM | POA: Insufficient documentation

## 2021-10-30 DIAGNOSIS — R7303 Prediabetes: Secondary | ICD-10-CM | POA: Diagnosis not present

## 2021-10-30 DIAGNOSIS — I110 Hypertensive heart disease with heart failure: Secondary | ICD-10-CM | POA: Diagnosis not present

## 2021-10-30 DIAGNOSIS — I5032 Chronic diastolic (congestive) heart failure: Secondary | ICD-10-CM | POA: Insufficient documentation

## 2021-10-30 DIAGNOSIS — R339 Retention of urine, unspecified: Secondary | ICD-10-CM | POA: Insufficient documentation

## 2021-10-30 DIAGNOSIS — I4891 Unspecified atrial fibrillation: Secondary | ICD-10-CM | POA: Diagnosis not present

## 2021-10-30 DIAGNOSIS — M1990 Unspecified osteoarthritis, unspecified site: Secondary | ICD-10-CM

## 2021-10-30 DIAGNOSIS — I441 Atrioventricular block, second degree: Secondary | ICD-10-CM | POA: Diagnosis not present

## 2021-10-30 HISTORY — PX: CARDIOVERSION: SHX1299

## 2021-10-30 LAB — CBC
HCT: 42.8 % (ref 39.0–52.0)
Hemoglobin: 14.4 g/dL (ref 13.0–17.0)
MCH: 30.4 pg (ref 26.0–34.0)
MCHC: 33.6 g/dL (ref 30.0–36.0)
MCV: 90.3 fL (ref 80.0–100.0)
Platelets: 241 10*3/uL (ref 150–400)
RBC: 4.74 MIL/uL (ref 4.22–5.81)
RDW: 13.4 % (ref 11.5–15.5)
WBC: 7.9 10*3/uL (ref 4.0–10.5)
nRBC: 0 % (ref 0.0–0.2)

## 2021-10-30 LAB — URINALYSIS, ROUTINE W REFLEX MICROSCOPIC
Bilirubin Urine: NEGATIVE
Glucose, UA: NEGATIVE mg/dL
Hgb urine dipstick: NEGATIVE
Leukocytes,Ua: NEGATIVE
Nitrite: NEGATIVE
Protein, ur: 30 mg/dL — AB
Specific Gravity, Urine: 1.01 (ref 1.005–1.030)
pH: 6.5 (ref 5.0–8.0)

## 2021-10-30 LAB — BASIC METABOLIC PANEL
Anion gap: 8 (ref 5–15)
BUN: 20 mg/dL (ref 8–23)
CO2: 24 mmol/L (ref 22–32)
Calcium: 9.6 mg/dL (ref 8.9–10.3)
Chloride: 108 mmol/L (ref 98–111)
Creatinine, Ser: 1.26 mg/dL — ABNORMAL HIGH (ref 0.61–1.24)
GFR, Estimated: 54 mL/min — ABNORMAL LOW (ref >60)
Glucose, Bld: 108 mg/dL — ABNORMAL HIGH (ref 70–99)
Potassium: 4.1 mmol/L (ref 3.5–5.1)
Sodium: 140 mmol/L (ref 135–145)

## 2021-10-30 SURGERY — CARDIOVERSION
Anesthesia: General

## 2021-10-30 MED ORDER — TAMSULOSIN HCL 0.4 MG PO CAPS: 0.4000 mg | ORAL_CAPSULE | Freq: Every day | ORAL | 0 refills | Status: AC

## 2021-10-30 MED ORDER — PROPOFOL 10 MG/ML IV BOLUS
INTRAVENOUS | Status: DC | PRN
  Administered 2021-10-30: 30 mg via INTRAVENOUS
  Administered 2021-10-30: 20 mg via INTRAVENOUS

## 2021-10-30 MED ORDER — SODIUM CHLORIDE 0.9 % IV SOLN
INTRAVENOUS | Status: AC | PRN
  Administered 2021-10-30: 500 mL via INTRAVENOUS

## 2021-10-30 MED ORDER — FUROSEMIDE 20 MG PO TABS: 20.0000 mg | ORAL_TABLET | Freq: Every day | ORAL | Status: DC

## 2021-10-30 MED ORDER — SODIUM CHLORIDE 0.9 % IV SOLN: INTRAVENOUS | Status: DC

## 2021-10-30 MED ORDER — LIDOCAINE 2% (20 MG/ML) 5 ML SYRINGE
INTRAMUSCULAR | Status: DC | PRN
  Administered 2021-10-30: 40 mg via INTRAVENOUS

## 2021-10-30 NOTE — ED Provider Notes (Signed)
[facility] EMERGENCY DEPT Provider Note   CSN: [id number] Arrival date & time: 10/30/21  1859     History  Chief Complaint  Patient presents with   Urinary Retention    Christopher Richards is a 88 y.o. male.  HPI Patient presents for concern of urinary retention.  Medical history includes HLD, HTN, BPH, prediabetes, atrial fibrillation, arthritis.  He underwent a cardioversion earlier today for atrial fibrillation which was successful.  This was around midday.  Since that procedure, he has been unable to urinate.  Last void was 8 AM.  Patient endorses a lower abdominal fullness.  When he does try to urinate, he is not able to urinate at all.  He does have a history of BPH but has never taken Flomax.  He was previously seen by urologist many years ago but has been lost to follow-up.    Home Medications Prior to Admission medications   Medication Sig Start Date End Date Taking? Authorizing Provider  tamsulosin (FLOMAX) 0.4 MG CAPS capsule Take 1 capsule (0.4 mg total) by mouth daily for 14 days. 10/30/21 11/13/21 Yes Godfrey Pick, MD  ADVAIR DISKUS 100-50 MCG/ACT AEPB TAKE 1 PUFF BY MOUTH TWICE A DAY 10/12/21   Burns, Claudina Lick, MD  ALOE-SODIUM CHLORIDE NA Place 1 spray into the nose at bedtime. Saline Nasal Spray With Aloe    [provider]  Cholecalciferol (VITAMIN D) 2000 UNITS tablet Take 2,000 Units by mouth daily.    [provider]  Cyanocobalamin (VITAMIN B-12 PO) Take 1 tablet by mouth daily.    [provider]  ELIQUIS 5 MG TABS tablet TAKE 1 TABLET BY MOUTH TWICE A DAY 10/08/21   Burns, Claudina Lick, MD  furosemide (LASIX) 20 MG tablet Take 1 tablet (20 mg total) by mouth daily. 10/30/21   Sherran Needs, NP  hydrALAZINE (APRESOLINE) 25 MG tablet TAKE 1 TABLET BY MOUTH THREE TIMES A DAY 05/01/21   Burns, Claudina Lick, MD  losartan (COZAAR) 100 MG tablet TAKE 1 TABLET BY MOUTH EVERY DAY Patient taking differently: Take 100 mg by mouth every evening. 05/01/21    Binnie Rail, MD  Sodium Chloride-Xylitol Angus Seller SINUS CARE SPRAY NA) Place 1-2 sprays into the nose daily as needed (sinus issues.).    [provider]      Allergies    Azithromycin and Amlodipine    Review of Systems   Review of Systems  Genitourinary:  Positive for difficulty urinating.  All other systems reviewed and are negative.   Physical Exam Updated Vital Signs BP (!) 144/53   Pulse (!) 50   Temp 97.9 F (36.6 C)   Resp 15   Ht '5\' 5"'$  (1.651 m)   Wt 70.7 kg   SpO2 97%   BMI 25.94 kg/m  Physical Exam Vitals and nursing note reviewed.  Constitutional:      General: He is not in acute distress.    Appearance: Normal appearance. He is well-developed and normal weight. He is not ill-appearing, toxic-appearing or diaphoretic.  HENT:     Head: Normocephalic and atraumatic.     Right Ear: External ear normal.     Left Ear: External ear normal.     Nose: Nose normal.     Mouth/Throat:     Mouth: Mucous membranes are moist.     Pharynx: Oropharynx is clear.  Eyes:     Extraocular Movements: Extraocular movements intact.     Conjunctiva/sclera: Conjunctivae normal.  Cardiovascular:  Rate and Rhythm: Normal rate and regular rhythm.     Heart sounds: No murmur heard. Pulmonary:     Effort: Pulmonary effort is normal. No respiratory distress.  Abdominal:     General: There is no distension.     Palpations: Abdomen is soft.     Tenderness: There is no abdominal tenderness.  Musculoskeletal:        General: No swelling. Normal range of motion.     Cervical back: Normal range of motion and neck supple.  Skin:    General: Skin is warm and dry.     Coloration: Skin is not jaundiced or pale.  Neurological:     General: No focal deficit present.     Mental Status: He is alert and oriented to person, place, and time.     Cranial Nerves: No cranial nerve deficit.     Sensory: No sensory deficit.     Motor: No weakness.     Coordination: Coordination  normal.  Psychiatric:        Mood and Affect: Mood normal.        Behavior: Behavior normal.        Thought Content: Thought content normal.        Judgment: Judgment normal.     ED Results / Procedures / Treatments   Labs (all labs ordered are listed, but only abnormal results are displayed) Labs Reviewed  URINALYSIS, ROUTINE W REFLEX MICROSCOPIC - Abnormal; Notable for the following components:      Result Value   Ketones, ur TRACE (*)    Protein, ur 30 (*)    All other components within normal limits    EKG None  Radiology No results found.  Procedures Procedures    Medications Ordered in ED Medications - No data to display  ED Course/ Medical Decision Making/ A&P                           Medical Decision Making Amount and/or Complexity of Data Reviewed Labs: ordered.  Risk Prescription drug management.   This patient presents to the ED for concern of difficulty urinating, this involves an extensive number of treatment options, and is a complaint that carries with it a high risk of complications and morbidity.  The differential diagnosis includes BPH, neoplasm, medication side effect, cauda equina   Co morbidities that complicate the patient evaluation   HLD, HTN, BPH, prediabetes, atrial fibrillation, arthritis   Additional history obtained:  Additional history obtained from patient's daughter External records from outside source obtained and reviewed including EMR   Lab Tests:  I Ordered, and personally interpreted labs.  The pertinent results include: No evidence of UTI.  Patient also had lab work from earlier today which was unremarkable.   Problem List / ED Course / Critical interventions / Medication management  Patient is a pleasant 88 year old male presenting for concern of difficulty urinating.  He did void this morning at 8 AM.  He underwent a elective cardioversion around midday.  He has not been able to urinate since this morning.   Patient is well-appearing on arrival in the ED.  He does endorse a sensation of lower abdominal fullness.  Prior to being bedded in the ED, patient underwent bladder scan which showed 570 cc of urine volume.  This does meet criteria for urinary retention.  Patient underwent placement of Foley catheter.  Following Foley catheter, he had 825 cc of urine output.  Urinalysis  does not suggest infection.  Given absence of any other symptoms, I do not feel any further work-up is indicated.  Patient to maintain Foley catheter until he is able to follow-up with urology for trial of voiding.  Flomax was prescribed.  Patient was discharged in good condition.   Social Determinants of Health:  Has access to outpatient care        Final Clinical Impression(s) / ED Diagnoses Final diagnoses:  Urinary retention    Rx / DC Orders ED Discharge Orders          Ordered    tamsulosin (FLOMAX) 0.4 MG CAPS capsule  Daily        10/30/21 2044              Godfrey Pick, MD 10/30/21 2048

## 2021-10-30 NOTE — Transfer of Care (Signed)
Immediate Anesthesia Transfer of Care Note  Patient: Christopher Richards  Procedure(s) Performed: CARDIOVERSION  Patient Location: Endoscopy Unit  Anesthesia Type:General  Level of Consciousness: awake, alert  and oriented  Airway & Oxygen Therapy: Patient Spontanous Breathing  Post-op Assessment: Report given to RN and Post -op Vital signs reviewed and stable  Post vital signs: Reviewed and stable  Last Vitals:  Vitals Value Taken Time  BP    Temp    Pulse    Resp    SpO2      Last Pain:  Vitals:   10/30/21 1103  TempSrc: Temporal  PainSc: 0-No pain         Complications: No notable events documented.

## 2021-10-30 NOTE — ED Notes (Signed)
Bladder Scan: 570 mL.

## 2021-10-30 NOTE — Anesthesia Procedure Notes (Signed)
Procedure Name: General with mask airway Date/Time: 10/30/2021 12:21 PM  Performed by: Griffin Dakin, CRNAPre-anesthesia Checklist: Patient identified, Emergency Drugs available, Suction available, Patient being monitored and Timeout performed Patient Re-evaluated:Patient Re-evaluated prior to induction Oxygen Delivery Method: Ambu bag Induction Type: IV induction Ventilation: Mask ventilation without difficulty Placement Confirmation: positive ETCO2 and breath sounds checked- equal and bilateral Dental Injury: Teeth and Oropharynx as per pre-operative assessment

## 2021-10-30 NOTE — Anesthesia Postprocedure Evaluation (Signed)
Anesthesia Post Note  Patient: Christopher Richards  Procedure(s) Performed: CARDIOVERSION     Patient location during evaluation: Endoscopy Anesthesia Type: General Level of consciousness: awake and alert Pain management: pain level controlled Vital Signs Assessment: post-procedure vital signs reviewed and stable Respiratory status: spontaneous breathing, nonlabored ventilation, respiratory function stable and patient connected to nasal cannula oxygen Cardiovascular status: blood pressure returned to baseline and stable Postop Assessment: no apparent nausea or vomiting Anesthetic complications: no   No notable events documented.  Last Vitals:  Vitals:   10/30/21 1252 10/30/21 1303  BP: (!) 188/85 (!) 195/79  Pulse: 69 78  Resp: 20 (!) 22  Temp:    SpO2: 95% 97%    Last Pain:  Vitals:   10/30/21 1303  TempSrc:   PainSc: 0-No pain                 Lateia Fraser L Naoki Migliaccio

## 2021-10-30 NOTE — Discharge Instructions (Addendum)
There is a telephone number below to call to set up a follow-up appointment with urology.  Keep Foley catheter in place until you discuss with them at your follow-up.  A medication was sent to your pharmacy that can help with urinary retention.  You can start taking this daily, starting now.  Return to the emergency department at any time for any new symptoms of concern.

## 2021-10-30 NOTE — ED Notes (Signed)
Pt verbalizes understanding of discharge instructions. Opportunity for questioning and answers were provided. Pt discharged from ED to home with daughter.    

## 2021-10-30 NOTE — CV Procedure (Signed)
    CARDIOVERSION NOTE  Procedure: Electrical Cardioversion Indications:  Atrial Fibrillation  Procedure Details:  Consent: Risks of procedure as well as the alternatives and risks of each were explained to the (patient/caregiver).  Consent for procedure obtained.  Time Out: Verified patient identification, verified procedure, site/side was marked, verified correct patient position, special equipment/implants available, medications/allergies/relevent history reviewed, required imaging and test results available.  Performed  Patient placed on cardiac monitor, pulse oximetry, supplemental oxygen as necessary.  Sedation given:  propofol per anesthesia Pacer pads placed anterior and posterior chest.  Cardioverted 1 time(s).  Cardioverted at 150J biphasic.  Impression: Findings: Post procedure EKG shows: NSR Complications: None Patient did tolerate procedure well.  Plan: Successful DCCV with a single 150J biphasic shock.  Time Spent Directly with the Patient:  30 minutes   Pixie Casino, MD, [facility], [place] Director of the [facility]  Card[facility] Diplomate of the American Board of Clinical Lipidology Attending Cardiologist  Direct Dial: [phone]  Fax: [phone]  Website:  [email or web address] 10/30/2021, 12:30 PM

## 2021-10-30 NOTE — H&P (View-Only) (Signed)
Primary Care Physician: Binnie Rail, MD Primary Cardiologist: Dr Johnsie Cancel Primary Electrophysiologist: none Referring Physician: Christen Bame NP   Christopher Richards is a 88 y.o. male with a history of HFpEF, HTN, atrial fibrillation who presents for follow up in the [facility].  The patient was initially diagnosed with atrial fibrillation 03/08/21 after presenting to his PCP with symptoms of an URI and SOB. He also had some increased lower extremity edema. ECG showed afib with slow ventricular response. Patient is on Eliquis for a CHADS2VASC score of 4. Patient is s/p DCCV on 06/07/21. He states that he feels much better in SR with increased energy and less tremors.   On follow up today, patient remains in rate controlled afib. He presents today for DCCV. He denies any missed doses of anticoagulation in the past 3 weeks. He has not had any food this AM.   Today, he denies symptoms of palpitations, chest pain, orthopnea, PND, lower extremity edema, dizziness, presyncope, syncope, snoring, daytime somnolence, bleeding, or neurologic sequela. The patient is tolerating medications without difficulties and is otherwise without complaint today.    Atrial Fibrillation Risk Factors:  he does not have symptoms or diagnosis of sleep apnea. he does not have a history of rheumatic fever. The patient does have a history of early familial atrial fibrillation or other arrhythmias.  he has a BMI of Body mass index is 25.93 kg/m.Marland Kitchen Filed Weights   10/30/21 0957  Weight: 70.7 kg    Family History  Problem Relation Age of Onset   Alcohol abuse Brother    Hypertension Mother    Heart disease Mother        AF   Transient ischemic attack Mother    Heart attack Father 77   Hypertension Father    Lung disease Father        ? [facility] visitor)   Cancer Neg Hx    Diabetes Neg Hx      Atrial Fibrillation Management history:  Previous antiarrhythmic drugs:  none Previous cardioversions: 06/07/21 Previous ablations: none CHADS2VASC score: 4 Anticoagulation history: Eliquis   Past Medical History:  Diagnosis Date   Arthritis    Basal cell cancer    SKIN.Marland KitchenFACE AND HAND   Benign prostatic hypertrophy    Bronchitis    11/2013    Chest pain 02/19/1990   NEGATIVE CATH   Hyperglycemia    PMH of   Hyperlipidemia    Hypertension    Pneumonia 02/20/2007   treated as OP   Tachycardia    hx of per patient    Past Surgical History:  Procedure Laterality Date   BASAL CELL CARCINOMA EXCISION     X 3   CARDIOVERSION N/A 06/07/2021   Procedure: CARDIOVERSION;  Surgeon: Buford Dresser, MD;  Location: [facility];  Service: Cardiovascular;  Laterality: N/A;  0905 shocked '@120j'$  0906 shocked '@150j'$ , NSR   cataract surgery      bilateral   COLONOSCOPY  2003   [facility]   HERNIA REPAIR  1972   Dr  Marylene Buerger   INGUINAL HERNIA REPAIR Right 04/06/2014   Procedure: REPAIR RIGHT INGUINAL HERNIA WITH MESH;  Surgeon: Jackolyn Confer, MD;  Location: [facility];  Service: General;  Laterality: Right;   INSERTION OF MESH Right 04/06/2014   Procedure: INSERTION OF MESH;  Surgeon: Jackolyn Confer, MD;  Location: [facility];  Service: General;  Laterality: Right;    Current Outpatient Medications  Medication Sig Dispense  Refill   ADVAIR DISKUS 100-50 MCG/ACT AEPB TAKE 1 PUFF BY MOUTH TWICE A DAY 60 each 1   ALOE-SODIUM CHLORIDE NA Place 1 spray into the nose at bedtime. Saline Nasal Spray With Aloe     Cholecalciferol (VITAMIN D) 2000 UNITS tablet Take 2,000 Units by mouth daily.     Cyanocobalamin (VITAMIN B-12 PO) Take 1 tablet by mouth daily.     ELIQUIS 5 MG TABS tablet TAKE 1 TABLET BY MOUTH TWICE A DAY 60 tablet 5   hydrALAZINE (APRESOLINE) 25 MG tablet TAKE 1 TABLET BY MOUTH THREE TIMES A DAY 270 tablet 2   losartan (COZAAR) 100 MG tablet TAKE 1 TABLET BY MOUTH EVERY DAY (Patient taking differently: Take 100 mg by mouth every evening.) 90 tablet 1    Sodium Chloride-Xylitol (XLEAR SINUS CARE SPRAY NA) Place 1-2 sprays into the nose daily as needed (sinus issues.).     furosemide (LASIX) 20 MG tablet Take 1 tablet (20 mg total) by mouth daily.     No current facility-administered medications for this encounter.    Allergies  Allergen Reactions   Azithromycin Other (See Comments)    Burning in tongue   Amlodipine Other (See Comments)    Ankle edema    Social History   Socioeconomic History   Marital status: Married    Spouse name: Not on file   Number of children: 2   Years of education: 37   Highest education level: Not on file  Occupational History   Occupation: [employer]  Tobacco Use   Smoking status: Never   Smokeless tobacco: Never  Substance and Sexual Activity   Alcohol use: No   Drug use: No   Sexual activity: Not Currently  Other Topics Concern   Not on file  Social History Narrative   Denies abuse and feels safe at home.   Social Determinants of Health   Financial Resource Strain: Low Risk  (07/10/2021)   Overall Financial Resource Strain (CARDIA)    Difficulty of Paying Living Expenses: Not hard at all  Food Insecurity: No Food Insecurity (10/12/2021)   Hunger Vital Sign    Worried About Running Out of Food in the Last Year: Never true    Ran Out of Food in the Last Year: Never true  Transportation Needs: No Transportation Needs (10/12/2021)   PRAPARE - Hydrologist (Medical): No    Lack of Transportation (Non-Medical): No  Physical Activity: Insufficiently Active (07/10/2021)   Exercise Vital Sign    Days of Exercise per Week: 3 days    Minutes of Exercise per Session: 40 min  Stress: No Stress Concern Present (07/10/2021)   [employer]    Feeling of Stress : Not at all  Social Connections: Moderately Integrated (07/10/2021)   Social Connection and Isolation Panel [NHANES]    Frequency of Communication with  Friends and Family: Three times a week    Frequency of Social Gatherings with Friends and Family: Three times a week    Attends Religious Services: More than 4 times per year    Active Member of Clubs or Organizations: No    Attends Archivist Meetings: Never    Marital Status: Married  Human resources officer Violence: Not At Risk (07/10/2021)   Humiliation, Afraid, Rape, and Kick questionnaire    Fear of Current or Ex-Partner: No    Emotionally Abused: No    Physically Abused: No  Sexually Abused: No     ROS- All systems are reviewed and negative except as per the HPI above.  Physical Exam: Vitals:   10/30/21 0957  BP: (!) 184/66  Pulse: (!) 56  Weight: 70.7 kg  Height: '5\' 5"'$  (1.651 m)    GEN- The patient is a well appearing elderly male, alert and oriented x 3 today.   HEENT-head normocephalic, atraumatic, sclera clear, conjunctiva pink, hearing intact, trachea midline. Lungs- Clear to ausculation bilaterally, normal work of breathing Heart- irrgular rate and rhythm, no murmurs, rubs or gallops  GI- soft, NT, ND, + BS Extremities- no clubbing, cyanosis, or edema MS- no significant deformity or atrophy Skin- no rash or lesion Psych- euthymic mood, full affect Neuro- strength and sensation are intact   Wt Readings from Last 3 Encounters:  10/30/21 70.7 kg  09/08/21 72.9 kg  08/01/21 71.7 kg    EKG today demonstrates  Afib with slow V rate Vent. rate 56 BPM PR interval * ms QRS duration 88 ms QT/QTcB 438/422 ms  Echo 03/16/21 demonstrated   1. Left ventricular ejection fraction, by estimation, is 60 to 65%. The  left ventricle has normal function. The left ventricle has no regional  wall motion abnormalities. There is mild asymmetric left ventricular  hypertrophy of the basal-septal segment. Diastolic function indeterminant due to AFib.   2. Right ventricular systolic function is normal. The right ventricular  size is normal. There is normal pulmonary  artery systolic pressure.   3. Left atrial size was mildly dilated.   4. Right atrial size was mildly dilated.   5. The mitral valve is normal in structure. Mild mitral valve  regurgitation.   6. The aortic valve is tricuspid. There is mild calcification of the  aortic valve. There is mild thickening of the aortic valve. Aortic valve  regurgitation is mild. Aortic valve sclerosis/calcification is present,  without any evidence of aortic stenosis.   Comparison(s): Compared to prior TTE in 2018, there is no significant  difference.   Epic records are reviewed at length today  CHA2DS2-VASc Score = 4  The patient's score is based upon: CHF History: 1 HTN History: 1 Diabetes History: 0 Stroke History: 0 Vascular Disease History: 0 Age Score: 2 Gender Score: 0       ASSESSMENT AND PLAN: 1. Persistent Atrial Fibrillation (ICD10:  I48.19) The patient's CHA2DS2-VASc score is 4, indicating a 4.8% annual risk of stroke.   Patient in afib, scheduled for DCCV today.  Unfortunately, his medication options are very limited with his baseline 2nd degree AV block, discussed with EP. Ultimately, he may opt for rate control only if he has quick return of his afib.  Continue Eliquis 5 mg BID, no missed doses in the past 3 weeks.   2. Secondary Hypercoagulable State (ICD10:  D68.69) The patient is at significant risk for stroke/thromboembolism based upon his CHA2DS2-VASc Score of 4.  Continue Apixaban (Eliquis).   3. HTN Elevated today, patient is anxious about his procedure. Is in the 174Y systolic at home.   4. Chronic HFpEF Dry weight ~ 155-160 lbs Fluid status appears stable today.  5. Second degree AV block Mobitz I Asymptomatic, heart rate appropriate Avoid AV nodal agents.    Follow up in the AF clinic 1-2 weeks post DCCV.    [facility] [address] [place], [place] [address] [phone] 10/30/2021 10:21 AM

## 2021-10-30 NOTE — Discharge Instructions (Signed)

## 2021-10-30 NOTE — Progress Notes (Signed)
Primary Care Physician: Binnie Rail, MD Primary Cardiologist: Dr Johnsie Cancel Primary Electrophysiologist: none Referring Physician: Christen Bame NP   Christopher Richards is a 88 y.o. male with a history of HFpEF, HTN, atrial fibrillation who presents for follow up in the [facility].  The patient was initially diagnosed with atrial fibrillation 03/08/21 after presenting to his PCP with symptoms of an URI and SOB. He also had some increased lower extremity edema. ECG showed afib with slow ventricular response. Patient is on Eliquis for a CHADS2VASC score of 4. Patient is s/p DCCV on 06/07/21. He states that he feels much better in SR with increased energy and less tremors.   On follow up today, patient remains in rate controlled afib. He presents today for DCCV. He denies any missed doses of anticoagulation in the past 3 weeks. He has not had any food this AM.   Today, he denies symptoms of palpitations, chest pain, orthopnea, PND, lower extremity edema, dizziness, presyncope, syncope, snoring, daytime somnolence, bleeding, or neurologic sequela. The patient is tolerating medications without difficulties and is otherwise without complaint today.    Atrial Fibrillation Risk Factors:  he does not have symptoms or diagnosis of sleep apnea. he does not have a history of rheumatic fever. The patient does have a history of early familial atrial fibrillation or other arrhythmias.  he has a BMI of Body mass index is 25.93 kg/m.Marland Kitchen Filed Weights   10/30/21 0957  Weight: 70.7 kg    Family History  Problem Relation Age of Onset   Alcohol abuse Brother    Hypertension Mother    Heart disease Mother        AF   Transient ischemic attack Mother    Heart attack Father 68   Hypertension Father    Lung disease Father        ? [facility] visitor)   Cancer Neg Hx    Diabetes Neg Hx      Atrial Fibrillation Management history:  Previous antiarrhythmic drugs:  none Previous cardioversions: 06/07/21 Previous ablations: none CHADS2VASC score: 4 Anticoagulation history: Eliquis   Past Medical History:  Diagnosis Date   Arthritis    Basal cell cancer    SKIN.Marland KitchenFACE AND HAND   Benign prostatic hypertrophy    Bronchitis    11/2013    Chest pain 02/19/1990   NEGATIVE CATH   Hyperglycemia    PMH of   Hyperlipidemia    Hypertension    Pneumonia 02/20/2007   treated as OP   Tachycardia    hx of per patient    Past Surgical History:  Procedure Laterality Date   BASAL CELL CARCINOMA EXCISION     X 3   CARDIOVERSION N/A 06/07/2021   Procedure: CARDIOVERSION;  Surgeon: Buford Dresser, MD;  Location: [facility];  Service: Cardiovascular;  Laterality: N/A;  0905 shocked '@120j'$  0906 shocked '@150j'$ , NSR   cataract surgery      bilateral   COLONOSCOPY  2003   [facility]   HERNIA REPAIR  1972   Dr  Marylene Buerger   INGUINAL HERNIA REPAIR Right 04/06/2014   Procedure: REPAIR RIGHT INGUINAL HERNIA WITH MESH;  Surgeon: Jackolyn Confer, MD;  Location: [facility];  Service: General;  Laterality: Right;   INSERTION OF MESH Right 04/06/2014   Procedure: INSERTION OF MESH;  Surgeon: Jackolyn Confer, MD;  Location: [facility];  Service: General;  Laterality: Right;    Current Outpatient Medications  Medication Sig Dispense  Refill   ADVAIR DISKUS 100-50 MCG/ACT AEPB TAKE 1 PUFF BY MOUTH TWICE A DAY 60 each 1   ALOE-SODIUM CHLORIDE NA Place 1 spray into the nose at bedtime. Saline Nasal Spray With Aloe     Cholecalciferol (VITAMIN D) 2000 UNITS tablet Take 2,000 Units by mouth daily.     Cyanocobalamin (VITAMIN B-12 PO) Take 1 tablet by mouth daily.     ELIQUIS 5 MG TABS tablet TAKE 1 TABLET BY MOUTH TWICE A DAY 60 tablet 5   hydrALAZINE (APRESOLINE) 25 MG tablet TAKE 1 TABLET BY MOUTH THREE TIMES A DAY 270 tablet 2   losartan (COZAAR) 100 MG tablet TAKE 1 TABLET BY MOUTH EVERY DAY (Patient taking differently: Take 100 mg by mouth every evening.) 90 tablet 1    Sodium Chloride-Xylitol (XLEAR SINUS CARE SPRAY NA) Place 1-2 sprays into the nose daily as needed (sinus issues.).     furosemide (LASIX) 20 MG tablet Take 1 tablet (20 mg total) by mouth daily.     No current facility-administered medications for this encounter.    Allergies  Allergen Reactions   Azithromycin Other (See Comments)    Burning in tongue   Amlodipine Other (See Comments)    Ankle edema    Social History   Socioeconomic History   Marital status: Married    Spouse name: Not on file   Number of children: 2   Years of education: 71   Highest education level: Not on file  Occupational History   Occupation: [employer]  Tobacco Use   Smoking status: Never   Smokeless tobacco: Never  Substance and Sexual Activity   Alcohol use: No   Drug use: No   Sexual activity: Not Currently  Other Topics Concern   Not on file  Social History Narrative   Denies abuse and feels safe at home.   Social Determinants of Health   Financial Resource Strain: Low Risk  (07/10/2021)   Overall Financial Resource Strain (CARDIA)    Difficulty of Paying Living Expenses: Not hard at all  Food Insecurity: No Food Insecurity (10/12/2021)   Hunger Vital Sign    Worried About Running Out of Food in the Last Year: Never true    Ran Out of Food in the Last Year: Never true  Transportation Needs: No Transportation Needs (10/12/2021)   PRAPARE - Hydrologist (Medical): No    Lack of Transportation (Non-Medical): No  Physical Activity: Insufficiently Active (07/10/2021)   Exercise Vital Sign    Days of Exercise per Week: 3 days    Minutes of Exercise per Session: 40 min  Stress: No Stress Concern Present (07/10/2021)   [place]    Feeling of Stress : Not at all  Social Connections: Moderately Integrated (07/10/2021)   Social Connection and Isolation Panel [NHANES]    Frequency of Communication with  Friends and Family: Three times a week    Frequency of Social Gatherings with Friends and Family: Three times a week    Attends Religious Services: More than 4 times per year    Active Member of Clubs or Organizations: No    Attends Archivist Meetings: Never    Marital Status: Married  Human resources officer Violence: Not At Risk (07/10/2021)   Humiliation, Afraid, Rape, and Kick questionnaire    Fear of Current or Ex-Partner: No    Emotionally Abused: No    Physically Abused: No  Sexually Abused: No     ROS- All systems are reviewed and negative except as per the HPI above.  Physical Exam: Vitals:   10/30/21 0957  BP: (!) 184/66  Pulse: (!) 56  Weight: 70.7 kg  Height: '5\' 5"'$  (1.651 m)    GEN- The patient is a well appearing elderly male, alert and oriented x 3 today.   HEENT-head normocephalic, atraumatic, sclera clear, conjunctiva pink, hearing intact, trachea midline. Lungs- Clear to ausculation bilaterally, normal work of breathing Heart- irrgular rate and rhythm, no murmurs, rubs or gallops  GI- soft, NT, ND, + BS Extremities- no clubbing, cyanosis, or edema MS- no significant deformity or atrophy Skin- no rash or lesion Psych- euthymic mood, full affect Neuro- strength and sensation are intact   Wt Readings from Last 3 Encounters:  10/30/21 70.7 kg  09/08/21 72.9 kg  08/01/21 71.7 kg    EKG today demonstrates  Afib with slow V rate Vent. rate 56 BPM PR interval * ms QRS duration 88 ms QT/QTcB 438/422 ms  Echo 03/16/21 demonstrated   1. Left ventricular ejection fraction, by estimation, is 60 to 65%. The  left ventricle has normal function. The left ventricle has no regional  wall motion abnormalities. There is mild asymmetric left ventricular  hypertrophy of the basal-septal segment. Diastolic function indeterminant due to AFib.   2. Right ventricular systolic function is normal. The right ventricular  size is normal. There is normal pulmonary  artery systolic pressure.   3. Left atrial size was mildly dilated.   4. Right atrial size was mildly dilated.   5. The mitral valve is normal in structure. Mild mitral valve  regurgitation.   6. The aortic valve is tricuspid. There is mild calcification of the  aortic valve. There is mild thickening of the aortic valve. Aortic valve  regurgitation is mild. Aortic valve sclerosis/calcification is present,  without any evidence of aortic stenosis.   Comparison(s): Compared to prior TTE in 2018, there is no significant  difference.   Epic records are reviewed at length today  CHA2DS2-VASc Score = 4  The patient's score is based upon: CHF History: 1 HTN History: 1 Diabetes History: 0 Stroke History: 0 Vascular Disease History: 0 Age Score: 2 Gender Score: 0       ASSESSMENT AND PLAN: 1. Persistent Atrial Fibrillation (ICD10:  I48.19) The patient's CHA2DS2-VASc score is 4, indicating a 4.8% annual risk of stroke.   Patient in afib, scheduled for DCCV today.  Unfortunately, his medication options are very limited with his baseline 2nd degree AV block, discussed with EP. Ultimately, he may opt for rate control only if he has quick return of his afib.  Continue Eliquis 5 mg BID, no missed doses in the past 3 weeks.   2. Secondary Hypercoagulable State (ICD10:  D68.69) The patient is at significant risk for stroke/thromboembolism based upon his CHA2DS2-VASc Score of 4.  Continue Apixaban (Eliquis).   3. HTN Elevated today, patient is anxious about his procedure. Is in the 375O systolic at home.   4. Chronic HFpEF Dry weight ~ 155-160 lbs Fluid status appears stable today.  5. Second degree AV block Mobitz I Asymptomatic, heart rate appropriate Avoid AV nodal agents.    Follow up in the AF clinic 1-2 weeks post DCCV.    [facility] [address] [place], [place] [address] [phone] 10/30/2021 10:21 AM

## 2021-10-30 NOTE — ED Triage Notes (Signed)
Patient here POV from Home.  Endorses Completing Scheduled Cardioversion today in the [facility] it went uncomplicated but has not been able to void since 0800 this AM which was when he last voided. Took Lasix at 1700.  NAD Noted during Triage. A&Ox4. GCS 15. Ambulatory.

## 2021-10-30 NOTE — Anesthesia Preprocedure Evaluation (Addendum)
Anesthesia Evaluation  Patient identified by MRN, date of birth, ID band Patient awake    Reviewed: Allergy & Precautions, NPO status , Patient's Chart, lab work & pertinent test results  Airway Mallampati: II  TM Distance: >3 FB Neck ROM: Full    Dental  (+) Edentulous Upper, Edentulous Lower, Dental Advisory Given, Lower Dentures, Upper Dentures   Pulmonary neg pulmonary ROS,    Pulmonary exam normal breath sounds clear to auscultation       Cardiovascular hypertension, Pt. on medications + DOE  Normal cardiovascular exam+ dysrhythmias (on eliquis) Atrial Fibrillation  Rhythm:Irregular Rate:Normal  TTE 2023 1. Left ventricular ejection fraction, by estimation, is 60 to 65%. The  left ventricle has normal function. The left ventricle has no regional  wall motion abnormalities. There is mild asymmetric left ventricular  hypertrophy of the basal-septal segment.  Diastolic function indeterminant due to AFib.  2. Right ventricular systolic function is normal. The right ventricular  size is normal. There is normal pulmonary artery systolic pressure.  3. Left atrial size was mildly dilated.  4. Right atrial size was mildly dilated.  5. The mitral valve is normal in structure. Mild mitral valve  regurgitation.  6. The aortic valve is tricuspid. There is mild calcification of the  aortic valve. There is mild thickening of the aortic valve. Aortic valve  regurgitation is mild. Aortic valve sclerosis/calcification is present,  without any evidence of aortic  stenosis.    Neuro/Psych negative neurological ROS  negative psych ROS   GI/Hepatic Neg liver ROS, hiatal hernia,   Endo/Other  negative endocrine ROS  Renal/GU negative Renal ROS  negative genitourinary   Musculoskeletal  (+) Arthritis ,   Abdominal   Peds  Hematology negative hematology ROS (+)   Anesthesia Other Findings   Reproductive/Obstetrics                             Anesthesia Physical Anesthesia Plan  ASA: 3  Anesthesia Plan: General   Post-op Pain Management:    Induction: Intravenous  PONV Risk Score and Plan: 2 and Propofol infusion and Treatment may vary due to age or medical condition  Airway Management Planned: Natural Airway  Additional Equipment:   Intra-op Plan:   Post-operative Plan:   Informed Consent: I have reviewed the patients History and Physical, chart, labs and discussed the procedure including the risks, benefits and alternatives for the proposed anesthesia with the patient or authorized representative who has indicated his/her understanding and acceptance.     Dental advisory given  Plan Discussed with: CRNA  Anesthesia Plan Comments:         Anesthesia Quick Evaluation

## 2021-10-30 NOTE — Interval H&P Note (Signed)
History and Physical Interval Note:  10/30/2021 12:14 PM  Christopher Richards  has presented today for surgery, with the diagnosis of AFIB.  The various methods of treatment have been discussed with the patient and family. After consideration of risks, benefits and other options for treatment, the patient has consented to  Procedure(s): CARDIOVERSION (N/A) as a surgical intervention.  The patient's history has been reviewed, patient examined, no change in status, stable for surgery.  I have reviewed the patient's chart and labs.  Questions were answered to the patient's satisfaction.     Pixie Casino

## 2021-11-03 ENCOUNTER — Other Ambulatory Visit: Payer: Self-pay | Admitting: Internal Medicine

## 2021-11-14 ENCOUNTER — Ambulatory Visit (HOSPITAL_COMMUNITY)
Admission: RE | Admit: 2021-11-14 | Discharge: 2021-11-14 | Disposition: A | Discharge status: Home / Self Care | Payer: Medicare HMO | Source: Ambulatory Visit | Attending: Physician Assistant | Admitting: Physician Assistant

## 2021-11-14 ENCOUNTER — Encounter (HOSPITAL_COMMUNITY): Payer: Self-pay | Admitting: Physician Assistant

## 2021-11-14 VITALS — BP 160/70 | HR 67 | Ht 65.0 in | Wt 156.6 lb

## 2021-11-14 DIAGNOSIS — D6869 Other thrombophilia: Secondary | ICD-10-CM | POA: Diagnosis not present

## 2021-11-14 DIAGNOSIS — I441 Atrioventricular block, second degree: Secondary | ICD-10-CM | POA: Diagnosis not present

## 2021-11-14 DIAGNOSIS — I5032 Chronic diastolic (congestive) heart failure: Secondary | ICD-10-CM | POA: Diagnosis not present

## 2021-11-14 DIAGNOSIS — Z7901 Long term (current) use of anticoagulants: Secondary | ICD-10-CM | POA: Diagnosis not present

## 2021-11-14 DIAGNOSIS — I4819 Other persistent atrial fibrillation: Secondary | ICD-10-CM | POA: Insufficient documentation

## 2021-11-14 DIAGNOSIS — I110 Hypertensive heart disease with heart failure: Secondary | ICD-10-CM | POA: Diagnosis not present

## 2021-11-14 DIAGNOSIS — R338 Other retention of urine: Secondary | ICD-10-CM | POA: Diagnosis not present

## 2021-11-14 NOTE — Progress Notes (Signed)
Primary Care Physician: Binnie Rail, MD Primary Cardiologist: Dr Johnsie Cancel Primary Electrophysiologist: none Referring Physician: Christen Bame NP   Christopher Richards is a 88 y.o. male with a history of HFpEF, HTN, atrial fibrillation who presents for follow up in the [facility].  The patient was initially diagnosed with atrial fibrillation 03/08/21 after presenting to his PCP with symptoms of an URI and SOB. He also had some increased lower extremity edema. ECG showed afib with slow ventricular response. Patient is on Eliquis for a CHADS2VASC score of 4. Patient is s/p DCCV on 06/07/21. He states that he feels much better in SR with increased energy and less tremors.   On follow up today, patient is s/p DCCV on 10/30/21. He did have issues with urinary retention afterwards and presented to the ED where a Foley was placed. Patient is back in rate controlled afib today. He really has not felt any different since the DCCV so he does not know when he went back into afib. He remains fatigued.   Today, he denies symptoms of palpitations, chest pain, orthopnea, PND, lower extremity edema, dizziness, presyncope, syncope, snoring, daytime somnolence, bleeding, or neurologic sequela. The patient is tolerating medications without difficulties and is otherwise without complaint today.    Atrial Fibrillation Risk Factors:  he does not have symptoms or diagnosis of sleep apnea. he does not have a history of rheumatic fever. The patient does have a history of early familial atrial fibrillation or other arrhythmias.  he has a BMI of Body mass index is 26.06 kg/m.Marland Kitchen Filed Weights   11/14/21 0959  Weight: 71 kg    Family History  Problem Relation Age of Onset   Alcohol abuse Brother    Hypertension Mother    Heart disease Mother        AF   Transient ischemic attack Mother    Heart attack Father 21   Hypertension Father    Lung disease Father        ? [employer]/gallery conservator)   Cancer Neg Hx    Diabetes Neg Hx      Atrial Fibrillation Management history:  Previous antiarrhythmic drugs: none Previous cardioversions: 06/07/21, 10/30/21 Previous ablations: none CHADS2VASC score: 4 Anticoagulation history: Eliquis   Past Medical History:  Diagnosis Date   Arthritis    Basal cell cancer    SKIN.Marland KitchenFACE AND HAND   Benign prostatic hypertrophy    Bronchitis    11/2013    Chest pain 02/19/1990   NEGATIVE CATH   Hyperglycemia    PMH of   Hyperlipidemia    Hypertension    Pneumonia 02/20/2007   treated as OP   Tachycardia    hx of per patient    Past Surgical History:  Procedure Laterality Date   BASAL CELL CARCINOMA EXCISION     X 3   CARDIOVERSION N/A 06/07/2021   Procedure: CARDIOVERSION;  Surgeon: Buford Dresser, MD;  Location: [place];  Service: Cardiovascular;  Laterality: N/A;  0905 shocked '@120j'$  0906 shocked '@150j'$ , NSR   CARDIOVERSION N/A 10/30/2021   Procedure: CARDIOVERSION;  Surgeon: Pixie Casino, MD;  Location: [facility] ENDOSCOPY;  Service: Cardiovascular;  Laterality: N/A;   cataract surgery      bilateral   COLONOSCOPY  2003   [facility]   HERNIA REPAIR  1972   Dr  Marylene Buerger   INGUINAL HERNIA REPAIR Right 04/06/2014   Procedure: REPAIR RIGHT INGUINAL HERNIA WITH MESH;  Surgeon:  Jackolyn Confer, MD;  Location: [facility];  Service: General;  Laterality: Right;   INSERTION OF MESH Right 04/06/2014   Procedure: INSERTION OF MESH;  Surgeon: Jackolyn Confer, MD;  Location: [facility];  Service: General;  Laterality: Right;    Current Outpatient Medications  Medication Sig Dispense Refill   ADVAIR DISKUS 100-50 MCG/ACT AEPB TAKE 1 PUFF BY MOUTH TWICE A DAY 60 each 1   ALOE-SODIUM CHLORIDE NA Place 1 spray into the nose at bedtime. Saline Nasal Spray With Aloe     Cholecalciferol (VITAMIN D) 2000 UNITS tablet Take 2,000 Units by mouth daily.     Cyanocobalamin (VITAMIN B-12 PO) Take 1 tablet by mouth daily.     ELIQUIS 5 MG  TABS tablet TAKE 1 TABLET BY MOUTH TWICE A DAY 60 tablet 5   furosemide (LASIX) 20 MG tablet Take 1 tablet (20 mg total) by mouth daily.     hydrALAZINE (APRESOLINE) 25 MG tablet TAKE 1 TABLET BY MOUTH THREE TIMES A DAY 270 tablet 2   losartan (COZAAR) 100 MG tablet TAKE 1 TABLET BY MOUTH EVERY DAY 90 tablet 1   Sodium Chloride-Xylitol (XLEAR SINUS CARE SPRAY NA) Place 1-2 sprays into the nose daily as needed (sinus issues.).     No current facility-administered medications for this encounter.    Allergies  Allergen Reactions   Azithromycin Other (See Comments)    Burning in tongue   Amlodipine Other (See Comments)    Ankle edema    Social History   Socioeconomic History   Marital status: Married    Spouse name: Not on file   Number of children: 2   Years of education: 27   Highest education level: Not on file  Occupational History   Occupation: RE[employer]  Tobacco Use   Smoking status: Never   Smokeless tobacco: Never   Tobacco comments:    Never smoke 11/14/21  Substance and Sexual Activity   Alcohol use: No   Drug use: No   Sexual activity: Not Currently  Other Topics Concern   Not on file  Social History Narrative   Denies abuse and feels safe at home.   Social Determinants of Health   Financial Resource Strain: Low Risk  (07/10/2021)   Overall Financial Resource Strain (CARDIA)    Difficulty of Paying Living Expenses: Not hard at all  Food Insecurity: No Food Insecurity (10/12/2021)   Hunger Vital Sign    Worried About Running Out of Food in the Last Year: Never true    Ran Out of Food in the Last Year: Never true  Transportation Needs: No Transportation Needs (10/12/2021)   PRAPARE - Hydrologist (Medical): No    Lack of Transportation (Non-Medical): No  Physical Activity: Insufficiently Active (07/10/2021)   Exercise Vital Sign    Days of Exercise per Week: 3 days    Minutes of Exercise per Session: 40 min  Stress: No Stress  Concern Present (07/10/2021)   [place]    Feeling of Stress : Not at all  Social Connections: Moderately Integrated (07/10/2021)   Social Connection and Isolation Panel [NHANES]    Frequency of Communication with Friends and Family: Three times a week    Frequency of Social Gatherings with Friends and Family: Three times a week    Attends Religious Services: More than 4 times per year    Active Member of Clubs or Organizations: No    Attends  Club or Organization Meetings: Never    Marital Status: Married  Human resources officer Violence: Not At Risk (07/10/2021)   Humiliation, Afraid, Rape, and Kick questionnaire    Fear of Current or Ex-Partner: No    Emotionally Abused: No    Physically Abused: No    Sexually Abused: No     ROS- All systems are reviewed and negative except as per the HPI above.  Physical Exam: Vitals:   11/14/21 0959  BP: (!) 160/70  Pulse: 67  Weight: 71 kg  Height: '5\' 5"'$  (1.651 m)     GEN- The patient is a well appearing elderly male, alert and oriented x 3 today.   HEENT-head normocephalic, atraumatic, sclera clear, conjunctiva pink, hearing intact, trachea midline. Lungs- Clear to ausculation bilaterally, normal work of breathing Heart- irregular rate and rhythm, no murmurs, rubs or gallops  GI- soft, NT, ND, + BS Extremities- no clubbing, cyanosis, trace edema R lower extremity  MS- no significant deformity or atrophy Skin- no rash or lesion Psych- euthymic mood, full affect Neuro- strength and sensation are intact   Wt Readings from Last 3 Encounters:  11/14/21 71 kg  10/30/21 70.7 kg  10/30/21 70.7 kg    EKG today demonstrates  Afib Vent. rate 67 BPM PR interval * ms QRS duration 80 ms QT/QTcB 422/445 ms  Echo 03/16/21 demonstrated   1. Left ventricular ejection fraction, by estimation, is 60 to 65%. The  left ventricle has normal function. The left ventricle has no regional   wall motion abnormalities. There is mild asymmetric left ventricular  hypertrophy of the basal-septal segment. Diastolic function indeterminant due to AFib.   2. Right ventricular systolic function is normal. The right ventricular  size is normal. There is normal pulmonary artery systolic pressure.   3. Left atrial size was mildly dilated.   4. Right atrial size was mildly dilated.   5. The mitral valve is normal in structure. Mild mitral valve  regurgitation.   6. The aortic valve is tricuspid. There is mild calcification of the  aortic valve. There is mild thickening of the aortic valve. Aortic valve  regurgitation is mild. Aortic valve sclerosis/calcification is present,  without any evidence of aortic stenosis.   Comparison(s): Compared to prior TTE in 2018, there is no significant  difference.   Epic records are reviewed at length today  CHA2DS2-VASc Score = 4  The patient's score is based upon: CHF History: 1 HTN History: 1 Diabetes History: 0 Stroke History: 0 Vascular Disease History: 0 Age Score: 2 Gender Score: 0       ASSESSMENT AND PLAN: 1. Persistent Atrial Fibrillation (ICD10:  I48.19) The patient's CHA2DS2-VASc score is 4, indicating a 4.8% annual risk of stroke.   S/p DCCV on 10/30/21, back in afib today. We discussed rate vs rhythm control again today. Unfortunately, his medication options are very limited with his baseline 2nd degree AV block without a PPM. Will refer him to EP to discuss options. He may ultimately be in permanent afib.  Continue Eliquis 5 mg BID  2. Secondary Hypercoagulable State (ICD10:  D68.69) The patient is at significant risk for stroke/thromboembolism based upon his CHA2DS2-VASc Score of 4.  Continue Apixaban (Eliquis).   3. HTN BP variable, no changes today.   4. Chronic HFpEF Dry weight ~ 155-160 lbs Fluid status appears stable today.  5. Second degree AV block Mobitz I Limits afib medication options.    Follow up with  EP to discuss afib options.  [facility] [address] [place], [place] [address] [phone] 11/14/2021 10:04 AM

## 2021-11-15 DIAGNOSIS — R338 Other retention of urine: Secondary | ICD-10-CM | POA: Diagnosis not present

## 2021-11-29 DIAGNOSIS — R338 Other retention of urine: Secondary | ICD-10-CM | POA: Diagnosis not present

## 2021-12-01 ENCOUNTER — Other Ambulatory Visit: Payer: Self-pay | Admitting: Internal Medicine

## 2021-12-11 ENCOUNTER — Other Ambulatory Visit: Payer: Self-pay | Admitting: Internal Medicine

## 2021-12-13 DIAGNOSIS — H40013 Open angle with borderline findings, low risk, bilateral: Secondary | ICD-10-CM | POA: Diagnosis not present

## 2021-12-13 DIAGNOSIS — H40053 Ocular hypertension, bilateral: Secondary | ICD-10-CM | POA: Diagnosis not present

## 2021-12-13 DIAGNOSIS — H3589 Other specified retinal disorders: Secondary | ICD-10-CM | POA: Diagnosis not present

## 2021-12-18 ENCOUNTER — Encounter: Payer: Self-pay | Admitting: Cardiovascular Disease

## 2021-12-18 ENCOUNTER — Ambulatory Visit: Payer: Medicare HMO | Attending: Cardiovascular Disease | Admitting: Cardiovascular Disease

## 2021-12-18 VITALS — BP 138/62 | HR 62 | Ht 65.0 in | Wt 154.0 lb

## 2021-12-18 DIAGNOSIS — I4819 Other persistent atrial fibrillation: Secondary | ICD-10-CM

## 2021-12-18 NOTE — Patient Instructions (Signed)
Medication Instructions:  Your physician has recommended you make the following change in your medication:  1) You will start on Tikosyn - please see below for further instructions *If you need a refill on your cardiac medications before your next appointment, please call your pharmacy*  Follow-Up: At [facility], you and your health needs are our priority.  As part of our continuing mission to provide you with exceptional heart care, we have created designated Provider Care Teams.  These Care Teams include your primary Cardiologist (physician) and Advanced Practice Providers (APPs -  Physician Assistants and Nurse Practitioners) who all work together to provide you with the care you need, when you need it.  Other Instructions Tikosyn (Dofetilide) Hospital Admission   Prior to day of admission:  Check with drug insurance company for cost of drug to ensure affordability --- Dofetilide 500 mcg twice a day.  GoodRx is an option if insurance copay is unaffordable.    No Benadryl is allowed 3 days prior to admission.   Please ensure no missed doses of your anticoagulation (blood thinner) for 3 weeks prior to admission. If a dose is missed please notify our office immediately.   A pharmacist will review all your medications for potential interactions with Tikosyn. If any medication changes are needed prior to admission we will be in touch with you.   If any new medications are started AFTER your admission date is set with Nurse, adult. Please notify our office immediately so your medication list can be updated and reviewed by our pharmacist again.  On day of admission:  Tikosyn initiation requires a 3 night/4 day hospital stay with constant telemetry monitoring. You will have an EKG after each dose of Tikosyn as well as daily lab draws.   If the drug does not convert you to normal rhythm a cardioversion after the 4th dose of Tikosyn.   Afib Clinic office visit on the morning of admission  is needed for preliminary labs/ekg.   Time of admission is dependent on bed availability in the hospital. In some instances, you will be sent home until bed is available. Rarely admission can be delayed to the following day if hospital census prevents available beds.   You may bring personal belongings/clothing with you to the hospital. Please leave your suitcase in the car until you arrive in admissions.   Questions please call our office at [place]

## 2021-12-18 NOTE — Progress Notes (Signed)
Cardiology Office Note:    Date:  12/18/2021   ID:  Christopher Richards, DOB 1927-12-14, MRN [id number]  PCP:  Binnie Rail, MD   [place] [facility] Cardiologist:  Jenkins Rouge, MD Electrophysiologist:  Melida Quitter, MD     Referring MD: Oliver Barre, [place]   Chief complaint: fatigue  History of Present Illness:    Christopher Richards is a 88 y.o. male with a hx of HFpEF, HTN, atrial fibrillation referred for arrhythmia management.    He was diagnosed with atrial fibrillation in January 2023 at the time of PCP visit for upper respiratory infection symptoms.  He was also experiencing shortness of breath and lower extremity edema.  The ventricular response was slightly slow at 54 bpm.  Eliquis started for CHA2DS2-VASc score of 4.  He underwent DC cardioversion in April and noted feeling improved energy afterwards.  He had recurrence and underwent a second cardioversion in September 2023.  Anesthesia was complicated by urinary retention for which a Foley catheter was placed.  He reports that his energy levels are quite poor. Additionally, his wife is suffering from Parkinson's disease and is currently in our ER due to complications.   Past Medical History:  Diagnosis Date   Arthritis    Basal cell cancer    SKIN.Marland KitchenFACE AND HAND   Benign prostatic hypertrophy    Bronchitis    11/2013    Chest pain 02/19/1990   NEGATIVE CATH   Hyperglycemia    PMH of   Hyperlipidemia    Hypertension    Pneumonia 02/20/2007   treated as OP   Tachycardia    hx of per patient     Past Surgical History:  Procedure Laterality Date   BASAL CELL CARCINOMA EXCISION     X 3   CARDIOVERSION N/A 06/07/2021   Procedure: CARDIOVERSION;  Surgeon: Buford Dresser, MD;  Location: [place];  Service: Cardiovascular;  Laterality: N/A;  0905 shocked '@120j'$  0906 shocked '@150j'$ , NSR   CARDIOVERSION N/A 10/30/2021   Procedure: CARDIOVERSION;  Surgeon: Pixie Casino, MD;  Location: [facility]  ENDOSCOPY;  Service: Cardiovascular;  Laterality: N/A;   cataract surgery      bilateral   COLONOSCOPY  2003   [facility]   HERNIA REPAIR  1972   Dr  Marylene Buerger   INGUINAL HERNIA REPAIR Right 04/06/2014   Procedure: REPAIR RIGHT INGUINAL HERNIA WITH MESH;  Surgeon: Jackolyn Confer, MD;  [facility];  Service: General;  Laterality: Right;   INSERTION OF MESH Right 04/06/2014   Procedure: INSERTION OF MESH;  Surgeon: Jackolyn Confer, MD;  [facility];  Service: General;  Laterality: Right;    Current Medications: Current Meds  Medication Sig   ADVAIR DISKUS 100-50 MCG/ACT AEPB INHALE 1 PUFF BY MOUTH TWICE A DAY   ALOE-SODIUM CHLORIDE NA Place 1 spray into the nose at bedtime. Saline Nasal Spray With Aloe   Cholecalciferol (VITAMIN D) 2000 UNITS tablet Take 2,000 Units by mouth daily.   Cyanocobalamin (VITAMIN B-12 PO) Take 1 tablet by mouth daily.   ELIQUIS 5 MG TABS tablet TAKE 1 TABLET BY MOUTH TWICE A DAY   finasteride (PROSCAR) 5 MG tablet Take 5 mg by mouth daily.   furosemide (LASIX) 20 MG tablet TAKE 2 TABS DAILY X 2 DAYS AND THEN 1 TAB DAILY   hydrALAZINE (APRESOLINE) 25 MG tablet TAKE 1 TABLET BY MOUTH THREE TIMES A DAY   losartan (COZAAR) 100 MG tablet TAKE 1 TABLET  BY MOUTH EVERY DAY   Sodium Chloride-Xylitol (XLEAR SINUS CARE SPRAY NA) Place 1-2 sprays into the nose daily as needed (sinus issues.).   tamsulosin (FLOMAX) 0.4 MG CAPS capsule Take 0.4 mg by mouth daily.     Allergies:   Azithromycin and Amlodipine   Social History   Socioeconomic History   Marital status: Married    Spouse name: Not on file   Number of children: 2   Years of education: 55   Highest education level: Not on file  Occupational History   Occupation: [employer]  Tobacco Use   Smoking status: Never   Smokeless tobacco: Never   Tobacco comments:    Never smoke 11/14/21  Substance and Sexual Activity   Alcohol use: No   Drug use: No   Sexual activity: Not Currently  Other Topics  Concern   Not on file  Social History Narrative   Denies abuse and feels safe at home.   Social Determinants of Health   Financial Resource Strain: Low Risk  (07/10/2021)   Overall Financial Resource Strain (CARDIA)    Difficulty of Paying Living Expenses: Not hard at all  Food Insecurity: No Food Insecurity (10/12/2021)   Hunger Vital Sign    Worried About Running Out of Food in the Last Year: Never true    Ran Out of Food in the Last Year: Never true  Transportation Needs: No Transportation Needs (10/12/2021)   PRAPARE - Hydrologist (Medical): No    Lack of Transportation (Non-Medical): No  Physical Activity: Insufficiently Active (07/10/2021)   Exercise Vital Sign    Days of Exercise per Week: 3 days    Minutes of Exercise per Session: 40 min  Stress: No Stress Concern Present (07/10/2021)   [place]    Feeling of Stress : Not at all  Social Connections: Moderately Integrated (07/10/2021)   Social Connection and Isolation Panel [NHANES]    Frequency of Communication with Friends and Family: Three times a week    Frequency of Social Gatherings with Friends and Family: Three times a week    Attends Religious Services: More than 4 times per year    Active Member of Clubs or Organizations: No    Attends Archivist Meetings: Never    Marital Status: Married     Family History: The patient's family history includes Alcohol abuse in his brother; Heart attack (age of onset: 62) in his father; Heart disease in his mother; Hypertension in his father and mother; Lung disease in his father; Transient ischemic attack in his mother. There is no history of Cancer or Diabetes.  ROS:   Please see the history of present illness.    All other systems reviewed and are negative.  EKGs/Labs/Other Studies Reviewed:     EKG:  Last EKG results: AF, V rate 65 bpm   Recent Labs: 03/08/2021:  ALT 18; TSH 1.19 05/04/2021: NT-Pro BNP 2,811 10/30/2021: BUN 20; Creatinine, Ser 1.26; Hemoglobin 14.4; Platelets 241; Potassium 4.1; Sodium 140    Risk Assessment/Calculations:    CHA2DS2-VASc Score = 4   This indicates a 4.8% annual risk of stroke. The patient's score is based upon: CHF History: 1 HTN History: 1 Diabetes History: 0 Stroke History: 0 Vascular Disease History: 0 Age Score: 2 Gender Score: 0         Physical Exam:    VS:  BP 138/62   Pulse 62   Ht  $'5\' 5"'i$  (1.651 m)   Wt 154 lb (69.9 kg)   SpO2 97%   BMI 25.63 kg/m     Wt Readings from Last 3 Encounters:  12/18/21 154 lb (69.9 kg)  11/14/21 156 lb 9.6 oz (71 kg)  10/30/21 155 lb 13.8 oz (70.7 kg)     GEN:  Well nourished, well developed in no acute distress CARDIAC: Irregular rhythm, no murmurs, rubs, gallops RESPIRATORY:  Normal work of breathing MUSCULOSKELETAL: no edema    ASSESSMENT & PLAN:    Persistent atrial fibrillation: has had bradycardia prohibiting many AADs. I do not think he is a candidate for ablation due to frailty and age. Tikosyn is a good option. I discussed the indication and rationale for the medication and explained risks including life threatening arrhythmia. I explained that he will need to be vigilant about checking for interactions with new medications.  Secondary hypercoagulable state: due to AF. Continue eliquis Urinary retention after cardioversion: will need to monitor urinary output. He has required a foley catheter after anesthesia/cardioversion in the past.        Medication Adjustments/Labs and Tests Ordered: Current medicines are reviewed at length with the patient today.  Concerns regarding medicines are outlined above.  No orders of the defined types were placed in this encounter.  No orders of the defined types were placed in this encounter.    Signed, Melida Quitter, MD  12/18/2021 12:20 PM    [place]

## 2021-12-19 ENCOUNTER — Telehealth: Payer: Self-pay | Admitting: Pharmacist

## 2021-12-19 NOTE — Telephone Encounter (Signed)
-----   Message from Juluis Mire, RN sent at 12/18/2021  1:50 PM EDT ----- Please review meds for tikosyn thanks stacy ----- Message ----- From: Bernestine Amass, RN Sent: 12/18/2021   1:29 PM EDT To: Juluis Mire, RN  Patient needs admission for Tikosyn.   Thanks!! [employer]

## 2021-12-19 NOTE — Telephone Encounter (Signed)
Medication list reviewed in anticipation of upcoming Tikosyn initiation. Patient is not taking any contraindicated or QTc prolonging medications.   Patient is anticoagulated on Eliquis 5 mg twice daily on the appropriate dose. Please ensure that patient has not missed any anticoagulation doses in the 3 weeks prior to Tikosyn initiation.   Patient will need to be counseled to avoid use of Benadryl while on Tikosyn and in the 2-3 days prior to Tikosyn initiation.

## 2021-12-22 ENCOUNTER — Telehealth (HOSPITAL_COMMUNITY): Payer: Self-pay | Admitting: *Deleted

## 2021-12-22 NOTE — Telephone Encounter (Signed)
Pt taking care of his wife who is currently hospitalized. Will call when ready to proceed with Tikosyn admission.

## 2022-01-09 DIAGNOSIS — R338 Other retention of urine: Secondary | ICD-10-CM | POA: Diagnosis not present

## 2022-01-09 DIAGNOSIS — N401 Enlarged prostate with lower urinary tract symptoms: Secondary | ICD-10-CM | POA: Diagnosis not present

## 2022-01-21 ENCOUNTER — Other Ambulatory Visit: Payer: Self-pay | Admitting: Internal Medicine

## 2022-03-24 ENCOUNTER — Other Ambulatory Visit: Payer: Self-pay | Admitting: Internal Medicine

## 2022-03-29 ENCOUNTER — Encounter (HOSPITAL_COMMUNITY): Payer: Self-pay | Admitting: *Deleted

## 2022-05-01 ENCOUNTER — Other Ambulatory Visit: Payer: Self-pay | Admitting: Internal Medicine

## 2022-05-03 ENCOUNTER — Other Ambulatory Visit: Payer: Self-pay

## 2022-05-03 ENCOUNTER — Other Ambulatory Visit: Payer: Self-pay | Admitting: Internal Medicine

## 2022-05-07 ENCOUNTER — Encounter: Payer: Self-pay | Admitting: Internal Medicine

## 2022-05-07 NOTE — Telephone Encounter (Signed)
Place form on MD desk../lmb 

## 2022-05-22 ENCOUNTER — Encounter: Payer: Self-pay | Admitting: Family Medicine

## 2022-05-22 ENCOUNTER — Ambulatory Visit (INDEPENDENT_AMBULATORY_CARE_PROVIDER_SITE_OTHER): Payer: Medicare Other | Admitting: Family Medicine

## 2022-05-22 VITALS — BP 142/82 | HR 70 | Temp 97.6°F | Ht 65.0 in | Wt 156.0 lb

## 2022-05-22 DIAGNOSIS — R600 Localized edema: Secondary | ICD-10-CM | POA: Diagnosis not present

## 2022-05-22 DIAGNOSIS — L03116 Cellulitis of left lower limb: Secondary | ICD-10-CM | POA: Diagnosis not present

## 2022-05-22 DIAGNOSIS — Z7901 Long term (current) use of anticoagulants: Secondary | ICD-10-CM

## 2022-05-22 DIAGNOSIS — R531 Weakness: Secondary | ICD-10-CM

## 2022-05-22 DIAGNOSIS — R21 Rash and other nonspecific skin eruption: Secondary | ICD-10-CM | POA: Diagnosis not present

## 2022-05-22 LAB — CBC WITH DIFFERENTIAL/PLATELET
Basophils Absolute: 0 10*3/uL (ref 0.0–0.1)
Basophils Relative: 0.3 % (ref 0.0–3.0)
Eosinophils Absolute: 0.1 10*3/uL (ref 0.0–0.7)
Eosinophils Relative: 1.5 % (ref 0.0–5.0)
HCT: 41.8 % (ref 39.0–52.0)
Hemoglobin: 13.9 g/dL (ref 13.0–17.0)
Lymphocytes Relative: 11.8 % — ABNORMAL LOW (ref 12.0–46.0)
Lymphs Abs: 1.1 10*3/uL (ref 0.7–4.0)
MCHC: 33.3 g/dL (ref 30.0–36.0)
MCV: 90.2 fl (ref 78.0–100.0)
Monocytes Absolute: 0.8 10*3/uL (ref 0.1–1.0)
Monocytes Relative: 8.4 % (ref 3.0–12.0)
Neutro Abs: 7.3 10*3/uL (ref 1.4–7.7)
Neutrophils Relative %: 78 % — ABNORMAL HIGH (ref 43.0–77.0)
Platelets: 275 10*3/uL (ref 150.0–400.0)
RBC: 4.64 Mil/uL (ref 4.22–5.81)
RDW: 15 % (ref 11.5–15.5)
WBC: 9.4 10*3/uL (ref 4.0–10.5)

## 2022-05-22 LAB — COMPREHENSIVE METABOLIC PANEL
ALT: 18 U/L (ref 0–53)
AST: 25 U/L (ref 0–37)
Albumin: 4.1 g/dL (ref 3.5–5.2)
Alkaline Phosphatase: 75 U/L (ref 39–117)
BUN: 20 mg/dL (ref 6–23)
CO2: 28 mEq/L (ref 19–32)
Calcium: 9.9 mg/dL (ref 8.4–10.5)
Chloride: 102 mEq/L (ref 96–112)
Creatinine, Ser: 1.19 mg/dL (ref 0.40–1.50)
GFR: 53.02 mL/min — ABNORMAL LOW (ref >60.00)
Glucose, Bld: 108 mg/dL — ABNORMAL HIGH (ref 70–99)
Potassium: 4.5 mEq/L (ref 3.5–5.1)
Sodium: 137 mEq/L (ref 135–145)
Total Bilirubin: 1.6 mg/dL — ABNORMAL HIGH (ref 0.2–1.2)
Total Protein: 7.2 g/dL (ref 6.0–8.3)

## 2022-05-22 LAB — C-REACTIVE PROTEIN: CRP: <1 mg/dL (ref 0.5–20.0)

## 2022-05-22 LAB — SEDIMENTATION RATE: Sed Rate: 19 mm/hr (ref 0–20)

## 2022-05-22 MED ORDER — SULFAMETHOXAZOLE-TRIMETHOPRIM 800-160 MG PO TABS: 1.0000 | ORAL_TABLET | Freq: Two times a day (BID) | ORAL | 0 refills | Status: DC

## 2022-05-22 NOTE — Patient Instructions (Signed)
Please go downstairs for labs.   Take Lasix 40 mg twice daily for the next 4 days and then go back to your usual dose.   Take the antibiotic as prescribed.   If you notice any worsening redness, drainage, swelling or pain then please go to the emergency department.  If you have any fever, chest pain, shortness of breath, vomiting then go to the emergency department or call 911 if needed.

## 2022-05-22 NOTE — Progress Notes (Signed)
Subjective:     Patient ID: Christopher Richards, male    DOB: 27-Jun-1927, 89 y.o.   MRN: [id number]  Chief Complaint  Patient presents with   Leg Swelling    Bilateral leg swelling started Thursday evening/Friday morning. Right leg has had some swelling prior (since A-Fib). Left leg is red, painful and is starting to burst and bleed    HPI Patient is in today for a 4-5 day hx of bilateral LE edema. Left lower leg is red and more swollen. Pain in left foot. Denies injury. No falls.   C/o generalized weakness. No fever, chills, dizziness, chest pain, palpitations, shortness of breath, abdominal pain, N/V/D.   On Eliquis for A-fib     Health Maintenance Due  Topic Date Due   Zoster Vaccines- Shingrix (1 of 2) Never done   DTaP/Tdap/Td (2 - Td or Tdap) 11/20/2021   Medicare Annual Wellness (AWV)  07/11/2022    Past Medical History:  Diagnosis Date   Arthritis    Basal cell cancer    SKIN.Marland KitchenFACE AND HAND   Benign prostatic hypertrophy    Bronchitis    11/2013    Chest pain 02/19/1990   NEGATIVE CATH   Hyperglycemia    PMH of   Hyperlipidemia    Hypertension    Pneumonia 02/20/2007   treated as OP   Tachycardia    hx of per patient     Past Surgical History:  Procedure Laterality Date   BASAL CELL CARCINOMA EXCISION     X 3   CARDIOVERSION N/A 06/07/2021   Procedure: CARDIOVERSION;  Surgeon: Buford Dresser, MD;  Location: [place];  Service: Cardiovascular;  Laterality: N/A;  0905 shocked @120j  0906 shocked @150j , NSR   CARDIOVERSION N/A 10/30/2021   Procedure: CARDIOVERSION;  Surgeon: Pixie Casino, MD;  Location: [facility] ENDOSCOPY;  Service: Cardiovascular;  Laterality: N/A;   cataract surgery      bilateral   COLONOSCOPY  2003   [facility]   HERNIA REPAIR  1972   Dr  Marylene Buerger   INGUINAL HERNIA REPAIR Right 04/06/2014   Procedure: REPAIR RIGHT INGUINAL HERNIA WITH MESH;  Surgeon: Jackolyn Confer, MD;  Location: [facility];  Service: General;  Laterality:  Right;   INSERTION OF MESH Right 04/06/2014   Procedure: INSERTION OF MESH;  Surgeon: Jackolyn Confer, MD;  Location: [facility];  Service: General;  Laterality: Right;    Family History  Problem Relation Age of Onset   Alcohol abuse Brother    Hypertension Mother    Heart disease Mother        AF   Transient ischemic attack Mother    Heart attack Father 50   Hypertension Father    Lung disease Father        ? [facility] visitor)   Cancer Neg Hx    Diabetes Neg Hx     Social History   Socioeconomic History   Marital status: Married    Spouse name: Not on file   Number of children: 2   Years of education: 18   Highest education level: Not on file  Occupational History   Occupation: [employer]  Tobacco Use   Smoking status: Never   Smokeless tobacco: Never   Tobacco comments:    Never smoke 11/14/21  Substance and Sexual Activity   Alcohol use: No   Drug use: No   Sexual activity: Not Currently  Other Topics Concern   Not on file  Social History  Narrative   Denies abuse and feels safe at home.   Social Determinants of Health   Financial Resource Strain: Low Risk  (07/10/2021)   Overall Financial Resource Strain (CARDIA)    Difficulty of Paying Living Expenses: Not hard at all  Food Insecurity: No Food Insecurity (10/12/2021)   Hunger Vital Sign    Worried About Running Out of Food in the Last Year: Never true    Ran Out of Food in the Last Year: Never true  Transportation Needs: No Transportation Needs (10/12/2021)   PRAPARE - Hydrologist (Medical): No    Lack of Transportation (Non-Medical): No  Physical Activity: Insufficiently Active (07/10/2021)   Exercise Vital Sign    Days of Exercise per Week: 3 days    Minutes of Exercise per Session: 40 min  Stress: No Stress Concern Present (07/10/2021)   [place]    Feeling of Stress : Not at all  Social Connections:  Moderately Integrated (07/10/2021)   Social Connection and Isolation Panel [NHANES]    Frequency of Communication with Friends and Family: Three times a week    Frequency of Social Gatherings with Friends and Family: Three times a week    Attends Religious Services: More than 4 times per year    Active Member of Clubs or Organizations: No    Attends Archivist Meetings: Never    Marital Status: Married  Human resources officer Violence: Not At Risk (07/10/2021)   Humiliation, Afraid, Rape, and Kick questionnaire    Fear of Current or Ex-Partner: No    Emotionally Abused: No    Physically Abused: No    Sexually Abused: No    Outpatient Medications Prior to Visit  Medication Sig Dispense Refill   ADVAIR DISKUS 100-50 MCG/ACT AEPB INHALE 1 PUFF BY MOUTH TWICE A DAY 60 each 1   ALOE-SODIUM CHLORIDE NA Place 1 spray into the nose at bedtime. Saline Nasal Spray With Aloe     Cholecalciferol (VITAMIN D) 2000 UNITS tablet Take 2,000 Units by mouth daily.     Cyanocobalamin (VITAMIN B-12 PO) Take 1 tablet by mouth daily.     ELIQUIS 5 MG TABS tablet TAKE 1 TABLET BY MOUTH TWICE A DAY 60 tablet 5   finasteride (PROSCAR) 5 MG tablet Take 5 mg by mouth daily.     furosemide (LASIX) 20 MG tablet TAKE 2 TABS DAILY X 2 DAYS AND THEN 1 TAB DAILY 90 tablet 1   hydrALAZINE (APRESOLINE) 25 MG tablet TAKE 1 TABLET BY MOUTH THREE TIMES A DAY 270 tablet 1   losartan (COZAAR) 100 MG tablet TAKE 1 TABLET BY MOUTH EVERY DAY 90 tablet 1   Sodium Chloride-Xylitol (XLEAR SINUS CARE SPRAY NA) Place 1-2 sprays into the nose daily as needed (sinus issues.).     tamsulosin (FLOMAX) 0.4 MG CAPS capsule Take 0.4 mg by mouth daily.     No facility-administered medications prior to visit.    Allergies  Allergen Reactions   Azithromycin Other (See Comments)    Burning in tongue   Amlodipine Other (See Comments)    Ankle edema    ROS     Objective:    Physical Exam Constitutional:      General: He is not  in acute distress.    Appearance: He is not ill-appearing.  Cardiovascular:     Rate and Rhythm: Normal rate. Rhythm irregular.  Pulmonary:     Effort: Pulmonary  effort is normal.     Breath sounds: Normal breath sounds.  Musculoskeletal:     Right lower leg: Edema present.     Left lower leg: Edema present.  Skin:    General: Skin is warm and dry.     Capillary Refill: Capillary refill takes 2 to 3 seconds.     Findings: Erythema present.          Comments: Bilateral LE edema, slightly worse on left, 2+ pitting.  Erythema of LLE. Medial LLE with reddish- purple rash  Scabbing and dried blood  Unable to palpate pulse of left foot.  TTP of left lateral foot.     Neurological:     General: No focal deficit present.     Mental Status: He is alert.     Gait: Gait abnormal.     Comments: No focal weakness  Psychiatric:        Mood and Affect: Mood normal.        Behavior: Behavior normal.     BP (!) 142/82 (BP Location: Left Arm, Patient Position: Sitting, Cuff Size: Large)   Pulse 70   Temp 97.6 F (36.4 C) (Temporal)   Ht 5\' 5"  (1.651 m)   Wt 156 lb (70.8 kg)   SpO2 98%   BMI 25.96 kg/m  Wt Readings from Last 3 Encounters:  05/22/22 156 lb (70.8 kg)  12/18/21 154 lb (69.9 kg)  11/14/21 156 lb 9.6 oz (71 kg)       Assessment & Plan:   Problem List Items Addressed This Visit   None Visit Diagnoses     Bilateral leg edema    -  Primary   Relevant Orders   CBC with Differential/Platelet (Completed)   Comprehensive metabolic panel (Completed)   Sedimentation rate (Completed)   C-reactive protein (Completed)   Cellulitis of left lower extremity       Relevant Medications   sulfamethoxazole-trimethoprim (BACTRIM DS) 800-160 MG tablet   Other Relevant Orders   CBC with Differential/Platelet (Completed)   Comprehensive metabolic panel (Completed)   Chronic anticoagulation       Generalized weakness       Relevant Orders   CBC with Differential/Platelet  (Completed)   Comprehensive metabolic panel (Completed)   Rash       Relevant Orders   Sedimentation rate (Completed)   C-reactive protein (Completed)      Here with his daughter.  Acute onset of bilateral LE edema and it seems that he has not been taking his Lasix recently due to frequent urination and feeling "dry".  Start on Lasix 20 mg bid. His daughter will assist with medication compliance.  Edema on left slightly worse with a rash that may be due to vasculitis. Check CRP and sed rate, CBC and CMP Start Bactrim for early cellulitis.  Strict precautions for when to seek immediate medical attention.  Continue Eliquis.  Follow up here in one week or sooner if needed.    I am having Christopher Richards start on sulfamethoxazole-trimethoprim. I am also having him maintain his Vitamin D, Cyanocobalamin (VITAMIN B-12 PO), ALOE-SODIUM CHLORIDE NA, Sodium Chloride-Xylitol (XLEAR SINUS CARE SPRAY NA), furosemide, Advair Diskus, finasteride, tamsulosin, hydrALAZINE, Eliquis, and losartan.  Meds ordered this encounter  Medications   sulfamethoxazole-trimethoprim (BACTRIM DS) 800-160 MG tablet    Sig: Take 1 tablet by mouth 2 (two) times daily.    Dispense:  20 tablet    Refill:  0    Order Specific Question:  Supervising Provider    Answer:   Pricilla Holm A [[id number]]

## 2022-05-22 NOTE — Progress Notes (Signed)
   Subjective:   Patient ID: Christopher Richards, male    DOB: [birth year], 89 y.o.   MRN: [id number]  HPI   Review of Systems  Objective:  Physical Exam  Vitals:   05/22/22 1129  BP: (!) 142/82  Pulse: 70  Temp: 97.6 F (36.4 C)  TempSrc: Temporal  SpO2: 98%  Weight: 156 lb (70.8 kg)  Height: 5\' 5"  (1.651 m)    Assessment & Plan:

## 2022-05-28 DIAGNOSIS — R600 Localized edema: Secondary | ICD-10-CM | POA: Insufficient documentation

## 2022-05-28 NOTE — Progress Notes (Unsigned)
      Subjective:    Patient ID: Christopher Richards, male    DOB: 1927-02-22, 89 y.o.   MRN: [id number]     HPI Locke is here for follow up of his chronic medical problems.  Saw Vickie last week for b/l leg edema, left leg cellulitis.  Was not taking lasix daily.  Rash - ? Vasculitis.  Labs checked.     Started on bactrim.   Medications and allergies reviewed with patient and updated if appropriate.  Current Outpatient Medications on File Prior to Visit  Medication Sig Dispense Refill   ADVAIR DISKUS 100-50 MCG/ACT AEPB INHALE 1 PUFF BY MOUTH TWICE A DAY 60 each 1   ALOE-SODIUM CHLORIDE NA Place 1 spray into the nose at bedtime. Saline Nasal Spray With Aloe     Cholecalciferol (VITAMIN D) 2000 UNITS tablet Take 2,000 Units by mouth daily.     Cyanocobalamin (VITAMIN B-12 PO) Take 1 tablet by mouth daily.     ELIQUIS 5 MG TABS tablet TAKE 1 TABLET BY MOUTH TWICE A DAY 60 tablet 5   finasteride (PROSCAR) 5 MG tablet Take 5 mg by mouth daily.     furosemide (LASIX) 20 MG tablet TAKE 2 TABS DAILY X 2 DAYS AND THEN 1 TAB DAILY 90 tablet 1   hydrALAZINE (APRESOLINE) 25 MG tablet TAKE 1 TABLET BY MOUTH THREE TIMES A DAY 270 tablet 1   losartan (COZAAR) 100 MG tablet TAKE 1 TABLET BY MOUTH EVERY DAY 90 tablet 1   Sodium Chloride-Xylitol (XLEAR SINUS CARE SPRAY NA) Place 1-2 sprays into the nose daily as needed (sinus issues.).     sulfamethoxazole-trimethoprim (BACTRIM DS) 800-160 MG tablet Take 1 tablet by mouth 2 (two) times daily. 20 tablet 0   tamsulosin (FLOMAX) 0.4 MG CAPS capsule Take 0.4 mg by mouth daily.     No current facility-administered medications on file prior to visit.     Review of Systems     Objective:  There were no vitals filed for this visit. BP Readings from Last 3 Encounters:  05/22/22 (!) 142/82  12/18/21 138/62  11/14/21 (!) 160/70   Wt Readings from Last 3 Encounters:  05/22/22 156 lb (70.8 kg)  12/18/21 154 lb (69.9 kg)  11/14/21 156 lb 9.6 oz (71 kg)    There is no height or weight on file to calculate BMI.    Physical Exam     Lab Results  Component Value Date   WBC 9.4 05/22/2022   HGB 13.9 05/22/2022   HCT 41.8 05/22/2022   PLT 275.0 05/22/2022   GLUCOSE 108 (H) 05/22/2022   CHOL 175 01/29/2020   TRIG 110.0 01/29/2020   HDL 37.00 (L) 01/29/2020   LDLCALC 116 (H) 01/29/2020   ALT 18 05/22/2022   AST 25 05/22/2022   NA 137 05/22/2022   K 4.5 05/22/2022   CL 102 05/22/2022   CREATININE 1.19 05/22/2022   BUN 20 05/22/2022   CO2 28 05/22/2022   TSH 1.19 03/08/2021   PSA 2.11 08/27/2006   INR 1.02 03/31/2014   HGBA1C 5.6 01/30/2021   MICROALBUR 3.3 (H) 05/01/2012     Assessment & Plan:    See Problem List for Assessment and Plan of chronic medical problems.

## 2022-05-29 ENCOUNTER — Ambulatory Visit (INDEPENDENT_AMBULATORY_CARE_PROVIDER_SITE_OTHER): Payer: Medicare Other | Admitting: Internal Medicine

## 2022-05-29 VITALS — BP 130/80 | HR 65 | Temp 97.7°F | Ht 65.0 in

## 2022-05-29 DIAGNOSIS — L0390 Cellulitis, unspecified: Secondary | ICD-10-CM | POA: Insufficient documentation

## 2022-05-29 DIAGNOSIS — R7303 Prediabetes: Secondary | ICD-10-CM

## 2022-05-29 DIAGNOSIS — R600 Localized edema: Secondary | ICD-10-CM | POA: Diagnosis not present

## 2022-05-29 DIAGNOSIS — I10 Essential (primary) hypertension: Secondary | ICD-10-CM | POA: Diagnosis not present

## 2022-05-29 DIAGNOSIS — L03116 Cellulitis of left lower limb: Secondary | ICD-10-CM

## 2022-05-29 NOTE — Assessment & Plan Note (Signed)
Chronic Blood pressure well controlled Continue hydralazine 25 mg 3 times daily, losartan 100 mg daily

## 2022-05-29 NOTE — Assessment & Plan Note (Signed)
Acute on chronic Worsening edema related to not taking prescribed lasix and likely traumatic injury to left lower leg Improvement with lasix increased back to 40mg  - took 60 mg x 1 dose and Bactrim ? Cellulitis -- there has been some improvement in leg erythema - some of which is related to edema Stressed elevating legs when sitting Lasix 60 mg twice this week - 40 mg rest of the week

## 2022-05-29 NOTE — Assessment & Plan Note (Signed)
Acute On bactrim - there has been improvement - complete course Some of his erythema is from the edema The area of concern on his lower leg leg is likely a traumatic injury - will improve more with elevation and decreased edema Stressed elevating leg when sitting Lasix 60 mg twice this week and 40 mg rest of week and daily  His daughter will monitor closely

## 2022-05-29 NOTE — Patient Instructions (Addendum)
     Elevate  your legs  when legs.    Medications changes include :   none - complete the Bactrim  Take lasix 60 mg twice this week - 40 mg the rest of the week      Return if symptoms worsen or fail to improve.

## 2022-06-14 ENCOUNTER — Encounter: Payer: Self-pay | Admitting: Internal Medicine

## 2022-06-16 ENCOUNTER — Other Ambulatory Visit: Payer: Self-pay | Admitting: Internal Medicine

## 2022-07-12 NOTE — Progress Notes (Signed)
I connected with  Christopher Richards (daughter) on 07/13/2022 by a audio enabled telemedicine application and verified that I am speaking with the correct person using two identifiers.  Patient Location: Home  Provider Location: Home Office  I discussed the limitations of evaluation and management by telemedicine. The patient expressed understanding and agreed to proceed.   Subjective:   Christopher Richards is a 89 y.o. male who presents for Medicare Annual/Subsequent preventive examination.  Review of Systems    Per HPI unless specifically indicated below.  Cardiac Risk Factors include: advanced age (>29men, >60 women);male gender, Essential Hypertension, and Hyperlipidemia.          Objective:       05/29/2022    1:10 PM 05/22/2022   11:29 AM 12/18/2021   12:18 PM  Vitals with BMI  Height 5\' 5"  5\' 5"  5\' 5"   Weight  156 lbs 154 lbs  BMI  25.96 25.63  Systolic 130 142 161  Diastolic 80 82 62  Pulse 65 70 62    There were no vitals filed for this visit. There is no height or weight on file to calculate BMI.     07/13/2022    9:18 AM 10/30/2021    7:26 PM 10/30/2021   10:59 AM 07/10/2021    9:41 AM 06/07/2021    8:37 AM 07/05/2020   11:14 AM 05/22/2016    8:24 AM  Advanced Directives  Does Patient Have a Medical Advance Directive? Yes No Yes Yes Yes Yes Yes  Type of Estate agent of [place];Living will  Healthcare Power of [place];Living will Healthcare Power of [place];Living will Living will;Healthcare Power of Attorney Living will;Healthcare Power of [employer] Power of [place];Living will  Does patient want to make changes to medical advance directive? No - Patient declined     No - Patient declined   Copy of Healthcare Power of Attorney in Chart? No - copy requested  No - copy requested No - copy requested No - copy requested No - copy requested No - copy requested  Would patient like information on creating a medical advance directive?  No  - Patient declined         Current Medications (verified) Outpatient Encounter Medications as of 07/13/2022  Medication Sig   ALOE-SODIUM CHLORIDE NA Place 1 spray into the nose at bedtime. Saline Nasal Spray With Aloe   Cholecalciferol (VITAMIN D) 2000 UNITS tablet Take 2,000 Units by mouth daily.   Cyanocobalamin (VITAMIN B-12 PO) Take 1 tablet by mouth daily.   ELIQUIS 5 MG TABS tablet TAKE 1 TABLET BY MOUTH TWICE A DAY   finasteride (PROSCAR) 5 MG tablet Take 5 mg by mouth daily.   furosemide (LASIX) 20 MG tablet TAKE 2 TABS DAILY X 2 DAYS AND THEN 1 TAB DAILY (Patient taking differently: 3 tablets one day, 2 tablets the next day)   hydrALAZINE (APRESOLINE) 25 MG tablet TAKE 1 TABLET BY MOUTH THREE TIMES A DAY   losartan (COZAAR) 100 MG tablet TAKE 1 TABLET BY MOUTH EVERY DAY   Sodium Chloride-Xylitol (XLEAR SINUS CARE SPRAY NA) Place 1-2 sprays into the nose daily as needed (sinus issues.).   tamsulosin (FLOMAX) 0.4 MG CAPS capsule Take 0.4 mg by mouth daily.   WIXELA INHUB 100-50 MCG/ACT AEPB TAKE 1 PUFF BY MOUTH TWICE A DAY   [DISCONTINUED] sulfamethoxazole-trimethoprim (BACTRIM DS) 800-160 MG tablet Take 1 tablet by mouth 2 (two) times daily. (Patient not taking: Reported  on 07/13/2022)   No facility-administered encounter medications on file as of 07/13/2022.    Allergies (verified) Azithromycin and Amlodipine   History: Past Medical History:  Diagnosis Date   Arthritis    Basal cell cancer    SKIN.[place] KitchenFACE AND HAND   Benign prostatic hypertrophy    Bronchitis    11/2013    Chest pain 02/19/1990   NEGATIVE CATH   Hyperglycemia    PMH of   Hyperlipidemia    Hypertension    Pneumonia 02/20/2007   treated as OP   Tachycardia    hx of per patient    Past Surgical History:  Procedure Laterality Date   BASAL CELL CARCINOMA EXCISION     X 3   CARDIOVERSION N/A 06/07/2021   Procedure: CARDIOVERSION;  Surgeon: Jodelle Red, MD;  Location: [facility] ENDOSCOPY;  Service:  Cardiovascular;  Laterality: N/A;  0905 shocked @120j  0906 shocked @150j , NSR   CARDIOVERSION N/A 10/30/2021   Procedure: CARDIOVERSION;  Surgeon: Chrystie Nose, MD;  Location: [facility] ENDOSCOPY;  Service: Cardiovascular;  Laterality: N/A;   cataract surgery      bilateral   COLONOSCOPY  2003   [facility]   HERNIA REPAIR  1972   Dr  Francina Ames   INGUINAL HERNIA REPAIR Right 04/06/2014   Procedure: REPAIR RIGHT INGUINAL HERNIA WITH MESH;  Surgeon: Avel Peace, MD;  Location: [facility] ORS;  Service: General;  Laterality: Right;   INSERTION OF MESH Right 04/06/2014   Procedure: INSERTION OF MESH;  Surgeon: Avel Peace, MD;  Location: [facility] ORS;  Service: General;  Laterality: Right;   Family History  Problem Relation Age of Onset   Alcohol abuse Brother    Hypertension Mother    Heart disease Mother        AF   Transient ischemic attack Mother    Heart attack Father 42   Hypertension Father    Lung disease Father        ? Black Lung Oceanographer)   Cancer Neg Hx    Diabetes Neg Hx    Social History   Socioeconomic History   Marital status: Widowed    Spouse name: Not on file   Number of children: 2   Years of education: 53   Highest education level: Master's degree (e.g., MA, MS, MEng, MEd, MSW, MBA)  Occupational History   Occupation: [employer]  Tobacco Use   Smoking status: Never   Smokeless tobacco: Never   Tobacco comments:    Never smoke 11/14/21  Vaping Use   Vaping Use: Never used  Substance and Sexual Activity   Alcohol use: No   Drug use: No   Sexual activity: Not Currently  Other Topics Concern   Not on file  Social History Narrative   Denies abuse and feels safe at home.   Social Determinants of Health   Financial Resource Strain: Low Risk  (07/13/2022)   Overall Financial Resource Strain (CARDIA)    Difficulty of Paying Living Expenses: Not hard at all  Food Insecurity: No Food Insecurity (07/13/2022)   Hunger Vital Sign    Worried About Running Out  of Food in the Last Year: Never true    Ran Out of Food in the Last Year: Never true  Transportation Needs: No Transportation Needs (05/28/2022)   PRAPARE - Administrator, Civil Service (Medical): No    Lack of Transportation (Non-Medical): No  Physical Activity: Inactive (07/13/2022)   Exercise Vital Sign    Days  of Exercise per Week: 0 days    Minutes of Exercise per Session: 0 min  Stress: Stress Concern Present (07/13/2022)   [employer] of Occupational Health - Occupational Stress Questionnaire    Feeling of Stress : Very much  Social Connections: Moderately Isolated (07/13/2022)   Social Connection and Isolation Panel [NHANES]    Frequency of Communication with Friends and Family: More than three times a week    Frequency of Social Gatherings with Friends and Family: More than three times a week    Attends Religious Services: 1 to 4 times per year    Active Member of [employer] or Organizations: No    Attends Banker Meetings: Never    Marital Status: Widowed    Tobacco Counseling Counseling given: No Tobacco comments: Never smoke 11/14/21   Clinical Intake:  Pre-visit preparation completed: No  Pain : No/denies pain     Nutritional Risks: Unintentional weight gain Diabetes: No  How often do you need to have someone help you when you read instructions, pamphlets, or other written materials from your doctor or pharmacy?: 1 - Never  Diabetic?No  Interpreter Needed?: No  Information entered by :: Laurel Dimmer, CMA   Activities of Daily Living    07/12/2022    4:21 PM  In your present state of health, do you have any difficulty performing the following activities:  Hearing? 1  Vision? 1  Difficulty concentrating or making decisions? 0  Walking or climbing stairs? 0  Dressing or bathing? 0  Doing errands, shopping? 0  Preparing Food and eating ? N  Using the Toilet? N  In the past six months, have you accidently leaked urine? Y  Do  you have problems with loss of bowel control? N  Managing your Medications? N  Managing your Finances? N  Housekeeping or managing your Housekeeping? N    Patient Care Team: Pincus Sanes, MD as PCP - General (Internal Medicine) Wendall Stade, MD as PCP - Cardiology (Cardiology) Mealor, Roberts Gaudy, MD as PCP - Electrophysiology (Cardiology) [place], Heather, OD as Consulting Physician (Optometry) Delaney Meigs, Royal Hawthorn, MD as Consulting Physician (Ophthalmology)  Indicate any recent Medical Services you may have received from other than [facility] providers in the past year (date may be approximate).     Assessment:   This is a routine wellness examination for Johnathan.   Hearing/Vision screen Admits to some hearing loss that he associate with age.  Denies any change to her vision. Wear glasses. Annual Eye Exam.   Dietary issues and exercise activities discussed: Current Exercise Habits: The patient does not participate in regular exercise at present, Exercise limited by: cardiac condition(s)   Goals Addressed   None    Depression Screen    07/13/2022    9:03 AM 07/13/2022    8:57 AM 05/29/2022    1:10 PM 08/01/2021   12:55 PM 07/10/2021    9:41 AM 07/10/2021    9:39 AM 06/20/2021    9:40 AM  PHQ 2/9 Scores  PHQ - 2 Score 3 3 0 0 0 0 1  PHQ- 9 Score 6 6  1      - The patient is still grieving the passing of his wife 2 months ago and also undergoing some new onset health issues. He is very tearful answering the screening questions.  Fall Risk    07/13/2022    9:07 AM 07/12/2022    4:21 PM 05/29/2022    1:10 PM 05/22/2022   11:37 AM  08/01/2021   12:55 PM  Fall Risk   Falls in the past year? 0 0 0 0 0  Number falls in past yr: 0 0 0 0 0  Injury with Fall? 0 0 0 0 0  Risk for fall due to : No Fall Risks  No Fall Risks Impaired balance/gait No Fall Risks  Follow up Falls evaluation completed  Falls evaluation completed Falls evaluation completed Falls evaluation completed    FALL RISK  PREVENTION PERTAINING TO THE HOME:  Any stairs in or around the home? No  If so, are there any without handrails? No  Home free of loose throw rugs in walkways, pet beds, electrical cords, etc? Yes  Adequate lighting in your home to reduce risk of falls? Yes   ASSISTIVE DEVICES UTILIZED TO PREVENT FALLS:  Life alert? No  Use of a cane, walker or w/c? No  Grab bars in the bathroom? Yes  Shower chair or bench in shower? Yes  Elevated toilet seat or a handicapped toilet? Yes   TIMED UP AND GO:  Was the test performed? Unable to perform, virtual appointment   Cognitive Function:    05/22/2016    8:27 AM  MMSE - Mini Mental State Exam  Orientation to time 5  Orientation to Place 5  Registration 3  Attention/ Calculation 5  Recall 2  Language- name 2 objects 2  Language- repeat 1  Language- follow 3 step command 3  Language- read & follow direction 1  Write a sentence 1  Copy design 1  Total score 29        07/13/2022    9:14 AM  6CIT Screen  What Year? 0 points  What month? 0 points  What time? 0 points  Count back from 20 0 points  Months in reverse 0 points  Repeat phrase 2 points  Total Score 2 points    Immunizations Immunization History  Administered Date(s) Administered   Fluad Quad(high Dose 65+) 12/03/2018   Influenza, High Dose Seasonal PF 11/19/2017   Influenza-Unspecified 11/24/2013, 11/17/2015, 11/26/2016, 11/20/2019   PFIZER(Purple Top)SARS-COV-2 Vaccination 03/10/2019, 03/30/2019   Pneumococcal Conjugate-13 06/23/2014   Pneumococcal Polysaccharide-23 03/09/2015   Tdap 11/21/2011    TDAP status: Due, Education has been provided regarding the importance of this vaccine. Advised may receive this vaccine at local pharmacy or Health Dept. Aware to provide a copy of the vaccination record if obtained from local pharmacy or Health Dept. Verbalized acceptance and understanding.  Flu Vaccine status: Up to date  Pneumococcal vaccine status: Up to  date  Covid-19 vaccine status: Information provided on how to obtain vaccines.   Qualifies for Shingles Vaccine? Yes   Zostavax completed No   Shingrix Completed?: No.    Education has been provided regarding the importance of this vaccine. Patient has been advised to call insurance company to determine out of pocket expense if they have not yet received this vaccine. Advised may also receive vaccine at local pharmacy or Health Dept. Verbalized acceptance and understanding.  Screening Tests Health Maintenance  Topic Date Due   Zoster Vaccines- Shingrix (1 of 2) Never done   COVID-19 Vaccine (3 - Pfizer risk series) 04/27/2019   DTaP/Tdap/Td (2 - Td or Tdap) 11/20/2021   INFLUENZA VACCINE  09/20/2022   Medicare Annual Wellness (AWV)  07/13/2023   Pneumonia Vaccine 69+ Years old  Completed   HPV VACCINES  Aged Out    Health Maintenance  Health Maintenance Due  Topic Date Due  Zoster Vaccines- Shingrix (1 of 2) Never done   COVID-19 Vaccine (3 - Pfizer risk series) 04/27/2019   DTaP/Tdap/Td (2 - Td or Tdap) 11/20/2021    Colorectal cancer screening: No longer required.   Lung Cancer Screening: (Low Dose CT Chest recommended if Age 9-80 years, 30 pack-year currently smoking OR have quit w/in 15years.) does not qualify.   Lung Cancer Screening Referral: not applicable   Additional Screening:  Hepatitis C Screening: does not qualify  Vision Screening: Recommended annual ophthalmology exams for early detection of glaucoma and other disorders of the eye. Is the patient up to date with their annual eye exam?  Yes  Who is the provider or what is the name of the office in which the patient attends annual eye exams? [facility]  If pt is not established with a provider, would they like to be referred to a provider to establish care? No .   Dental Screening: Recommended annual dental exams for proper oral hygiene  Community Resource Referral / Chronic Care Management: CRR  required this visit?  No   CCM required this visit?  No      Plan:     I have personally reviewed and noted the following in the patient's chart:   Medical and social history Use of alcohol, tobacco or illicit drugs  Current medications and supplements including opioid prescriptions. Patient is not currently taking opioid prescriptions. Functional ability and status Nutritional status Physical activity Advanced directives List of other physicians Hospitalizations, surgeries, and ER visits in previous 12 months Vitals Screenings to include cognitive, depression, and falls Referrals and appointments  In addition, I have reviewed and discussed with patient certain preventive protocols, quality metrics, and best practice recommendations. A written personalized care plan for preventive services as well as general preventive health recommendations were provided to patient.    Mr. Landsberger , Thank you for taking time to come for your Medicare Wellness Visit. I appreciate your ongoing commitment to your health goals. Please review the following plan we discussed and let me know if I can assist you in the future.   These are the goals we discussed:  Goals      I got to get back to exercising     Begin to walk every day. Cut back on salt and fry foods, start to eat more fruit, nuts and vegetables.      Patient Stated     I want to remain independent and active as much as possible.        This is a list of the screening recommended for you and due dates:  Health Maintenance  Topic Date Due   Zoster (Shingles) Vaccine (1 of 2) Never done   COVID-19 Vaccine (3 - Pfizer risk series) 04/27/2019   DTaP/Tdap/Td vaccine (2 - Td or Tdap) 11/20/2021   Flu Shot  09/20/2022   Medicare Annual Wellness Visit  07/13/2023   Pneumonia Vaccine  Completed   HPV Vaccine  Aged [address] [place], [place]   07/13/2022  Nurse Notes:Approximately 30 minute Non-Face -To-Face Medicare Wellness  Visit

## 2022-07-12 NOTE — Patient Instructions (Signed)
Health Maintenance, Male Adopting a healthy lifestyle and getting preventive care are important in promoting health and wellness. Ask your health care provider about: The right schedule for you to have regular tests and exams. Things you can do on your own to prevent diseases and keep yourself healthy. What should I know about diet, weight, and exercise? Eat a healthy diet  Eat a diet that includes plenty of vegetables, fruits, low-fat dairy products, and lean protein. Do not eat a lot of foods that are high in solid fats, added sugars, or sodium. Maintain a healthy weight Body mass index (BMI) is a measurement that can be used to identify possible weight problems. It estimates body fat based on height and weight. Your health care provider can help determine your BMI and help you achieve or maintain a healthy weight. Get regular exercise Get regular exercise. This is one of the most important things you can do for your health. Most adults should: Exercise for at least 150 minutes each week. The exercise should increase your heart rate and make you sweat (moderate-intensity exercise). Do strengthening exercises at least twice a week. This is in addition to the moderate-intensity exercise. Spend less time sitting. Even light physical activity can be beneficial. Watch cholesterol and blood lipids Have your blood tested for lipids and cholesterol at 87 years of age, then have this test every 5 years. You may need to have your cholesterol levels checked more often if: Your lipid or cholesterol levels are high. You are older than 87 years of age. You are at high risk for heart disease. What should I know about cancer screening? Many types of cancers can be detected early and may often be prevented. Depending on your health history and family history, you may need to have cancer screening at various ages. This may include screening for: Colorectal cancer. Prostate cancer. Skin cancer. Lung  cancer. What should I know about heart disease, diabetes, and high blood pressure? Blood pressure and heart disease High blood pressure causes heart disease and increases the risk of stroke. This is more likely to develop in people who have high blood pressure readings or are overweight. Talk with your health care provider about your target blood pressure readings. Have your blood pressure checked: Every 3-5 years if you are 18-39 years of age. Every year if you are 40 years old or older. If you are between the ages of 65 and 75 and are a current or former smoker, ask your health care provider if you should have a one-time screening for abdominal aortic aneurysm (AAA). Diabetes Have regular diabetes screenings. This checks your fasting blood sugar level. Have the screening done: Once every three years after age 45 if you are at a normal weight and have a low risk for diabetes. More often and at a younger age if you are overweight or have a high risk for diabetes. What should I know about preventing infection? Hepatitis B If you have a higher risk for hepatitis B, you should be screened for this virus. Talk with your health care provider to find out if you are at risk for hepatitis B infection. Hepatitis C Blood testing is recommended for: Everyone born from 1945 through 1965. Anyone with known risk factors for hepatitis C. Sexually transmitted infections (STIs) You should be screened each year for STIs, including gonorrhea and chlamydia, if: You are sexually active and are younger than 87 years of age. You are older than 87 years of age and your   health care provider tells you that you are at risk for this type of infection. Your sexual activity has changed since you were last screened, and you are at increased risk for chlamydia or gonorrhea. Ask your health care provider if you are at risk. Ask your health care provider about whether you are at high risk for HIV. Your health care provider  may recommend a prescription medicine to help prevent HIV infection. If you choose to take medicine to prevent HIV, you should first get tested for HIV. You should then be tested every 3 months for as long as you are taking the medicine. Follow these instructions at home: Alcohol use Do not drink alcohol if your health care provider tells you not to drink. If you drink alcohol: Limit how much you have to 0-2 drinks a day. Know how much alcohol is in your drink. In the [place] one drink equals one 12 oz bottle of beer (355 mL), one 5 oz glass of wine (148 mL), or one 1 oz glass of hard liquor (44 mL). Lifestyle Do not use any products that contain nicotine or tobacco. These products include cigarettes, chewing tobacco, and vaping devices, such as e-cigarettes. If you need help quitting, ask your health care provider. Do not use street drugs. Do not share needles. Ask your health care provider for help if you need support or information about quitting drugs. General instructions Schedule regular health, dental, and eye exams. Stay current with your vaccines. Tell your health care provider if: You often feel depressed. You have ever been abused or do not feel safe at home. Summary Adopting a healthy lifestyle and getting preventive care are important in promoting health and wellness. Follow your health care provider's instructions about healthy diet, exercising, and getting tested or screened for diseases. Follow your health care provider's instructions on monitoring your cholesterol and blood pressure. This information is not intended to replace advice given to you by your health care provider. Make sure you discuss any questions you have with your health care provider. Document Revised: 06/27/2020 Document Reviewed: 06/27/2020 Elsevier Patient Education  2024 Elsevier Inc.  

## 2022-07-13 ENCOUNTER — Ambulatory Visit (INDEPENDENT_AMBULATORY_CARE_PROVIDER_SITE_OTHER): Payer: Medicare Other

## 2022-07-13 ENCOUNTER — Other Ambulatory Visit: Payer: Self-pay | Admitting: Internal Medicine

## 2022-07-13 DIAGNOSIS — Z0000 Encounter for general adult medical examination without abnormal findings: Secondary | ICD-10-CM | POA: Diagnosis not present

## 2022-07-23 ENCOUNTER — Encounter: Payer: Self-pay | Admitting: Internal Medicine

## 2022-07-23 NOTE — Patient Instructions (Signed)
      Blood work was ordered.   The lab is on the first floor.    Medications changes include :       A referral was ordered and someone will call you to schedule an appointment.     No follow-ups on file.  

## 2022-07-23 NOTE — Progress Notes (Unsigned)
      Subjective:    Patient ID: Christopher Richards, male    DOB: January 16, 1928, 89 y.o.   MRN: [id number]     HPI Christopher Richards is here for follow up of his chronic medical problems.    Medications and allergies reviewed with patient and updated if appropriate.  Current Outpatient Medications on File Prior to Visit  Medication Sig Dispense Refill   ALOE-SODIUM CHLORIDE NA Place 1 spray into the nose at bedtime. Saline Nasal Spray With Aloe     amLODipine (NORVASC) 2.5 MG tablet TAKE 1 TABLET BY MOUTH EVERY DAY 90 tablet 3   Cholecalciferol (VITAMIN D) 2000 UNITS tablet Take 2,000 Units by mouth daily.     Cyanocobalamin (VITAMIN B-12 PO) Take 1 tablet by mouth daily.     ELIQUIS 5 MG TABS tablet TAKE 1 TABLET BY MOUTH TWICE A DAY 60 tablet 5   finasteride (PROSCAR) 5 MG tablet Take 5 mg by mouth daily.     furosemide (LASIX) 20 MG tablet TAKE 2 TABS DAILY X 2 DAYS AND THEN 1 TAB DAILY (Patient taking differently: 3 tablets one day, 2 tablets the next day) 90 tablet 1   hydrALAZINE (APRESOLINE) 25 MG tablet TAKE 1 TABLET BY MOUTH THREE TIMES A DAY 270 tablet 1   losartan (COZAAR) 100 MG tablet TAKE 1 TABLET BY MOUTH EVERY DAY 90 tablet 1   Sodium Chloride-Xylitol (XLEAR SINUS CARE SPRAY NA) Place 1-2 sprays into the nose daily as needed (sinus issues.).     tamsulosin (FLOMAX) 0.4 MG CAPS capsule Take 0.4 mg by mouth daily.     WIXELA INHUB 100-50 MCG/ACT AEPB TAKE 1 PUFF BY MOUTH TWICE A DAY 60 each 1   No current facility-administered medications on file prior to visit.     Review of Systems     Objective:  There were no vitals filed for this visit. BP Readings from Last 3 Encounters:  05/29/22 130/80  05/22/22 (!) 142/82  12/18/21 138/62   Wt Readings from Last 3 Encounters:  05/22/22 156 lb (70.8 kg)  12/18/21 154 lb (69.9 kg)  11/14/21 156 lb 9.6 oz (71 kg)   There is no height or weight on file to calculate BMI.    Physical Exam     Lab Results  Component Value Date    WBC 9.4 05/22/2022   HGB 13.9 05/22/2022   HCT 41.8 05/22/2022   PLT 275.0 05/22/2022   GLUCOSE 108 (H) 05/22/2022   CHOL 175 01/29/2020   TRIG 110.0 01/29/2020   HDL 37.00 (L) 01/29/2020   LDLCALC 116 (H) 01/29/2020   ALT 18 05/22/2022   AST 25 05/22/2022   NA 137 05/22/2022   K 4.5 05/22/2022   CL 102 05/22/2022   CREATININE 1.19 05/22/2022   BUN 20 05/22/2022   CO2 28 05/22/2022   TSH 1.19 03/08/2021   PSA 2.11 08/27/2006   INR 1.02 03/31/2014   HGBA1C 5.6 01/30/2021   MICROALBUR 3.3 (H) 05/01/2012     Assessment & Plan:    See Problem List for Assessment and Plan of chronic medical problems.

## 2022-07-24 ENCOUNTER — Ambulatory Visit (INDEPENDENT_AMBULATORY_CARE_PROVIDER_SITE_OTHER): Payer: Medicare Other | Admitting: Internal Medicine

## 2022-07-24 ENCOUNTER — Encounter: Payer: Self-pay | Admitting: Internal Medicine

## 2022-07-24 ENCOUNTER — Ambulatory Visit (INDEPENDENT_AMBULATORY_CARE_PROVIDER_SITE_OTHER): Payer: Medicare Other

## 2022-07-24 VITALS — BP 116/80 | HR 58 | Temp 97.9°F | Ht 65.0 in | Wt 155.0 lb

## 2022-07-24 DIAGNOSIS — M25571 Pain in right ankle and joints of right foot: Secondary | ICD-10-CM

## 2022-07-24 DIAGNOSIS — R600 Localized edema: Secondary | ICD-10-CM

## 2022-07-24 DIAGNOSIS — I4819 Other persistent atrial fibrillation: Secondary | ICD-10-CM | POA: Diagnosis not present

## 2022-07-24 DIAGNOSIS — M79671 Pain in right foot: Secondary | ICD-10-CM | POA: Diagnosis not present

## 2022-07-24 DIAGNOSIS — M7989 Other specified soft tissue disorders: Secondary | ICD-10-CM | POA: Diagnosis not present

## 2022-07-24 DIAGNOSIS — I5032 Chronic diastolic (congestive) heart failure: Secondary | ICD-10-CM

## 2022-07-24 DIAGNOSIS — I5030 Unspecified diastolic (congestive) heart failure: Secondary | ICD-10-CM | POA: Insufficient documentation

## 2022-07-24 NOTE — Assessment & Plan Note (Signed)
Chronic Appears to be euvolemic, but still has chronic leg edema-left leg seems to be at baseline, right lower extremity is more than usual for unknown reason No shortness of breath out of the ordinary, but he does have chronic shortness of breath-I do think he limits himself because of some of his symptoms which may improve if he was not in atrial fibrillation is much Continue Lasix 60 mg, 40 mg, 40 mg, 40 mg, 60 mg.Marland KitchenMarland Kitchen

## 2022-07-24 NOTE — Assessment & Plan Note (Addendum)
Chronic lower extremity edema Acute right lower extremity worse than left lower extremity without obvious cause He denies any injury, but he does have tenderness on the lateral aspect of his right foot and ankle-?  Tendinitis or strain Will get x-rays of the right ankle and foot-if negative and symptoms or not improving will consider sports medicine referral to evaluate for soft tissue injury such as tendinitis Advised conservative treatment At this point I do think he is euvolemic so would like to avoid increasing his Lasix Continue compression socks daily, elevating legs daily Further evaluation depending on x-ray results Continue Lasix 60 mg, 40 mg, 40 mg, 40 mg, repeat

## 2022-07-24 NOTE — Assessment & Plan Note (Signed)
Chronic Persistent A-fib Just before his wife died he did see cardiology and they recommended considering Tikosyn-because of the events they were not able to really consider that at that time He does seem to be in and out of atrial fibrillation and I think he feels fatigued and not good more than he realizes which is likely from atrial fibrillation Recommended follow-up with cardiology to discuss this again Continue Eliquis 5 mg twice daily

## 2022-07-25 ENCOUNTER — Ambulatory Visit (HOSPITAL_COMMUNITY)
Admission: RE | Admit: 2022-07-25 | Discharge: 2022-07-25 | Disposition: A | Discharge status: Home / Self Care | Payer: Medicare Other | Source: Ambulatory Visit | Attending: Cardiovascular Disease | Admitting: Cardiovascular Disease

## 2022-07-25 DIAGNOSIS — R600 Localized edema: Secondary | ICD-10-CM

## 2022-09-17 ENCOUNTER — Other Ambulatory Visit: Payer: Self-pay | Admitting: Internal Medicine

## 2022-09-26 ENCOUNTER — Encounter: Payer: Self-pay | Admitting: Internal Medicine

## 2022-09-26 MED ORDER — FUROSEMIDE 40 MG PO TABS: ORAL_TABLET | ORAL | 3 refills | Status: DC

## 2022-09-26 MED ORDER — FUROSEMIDE 20 MG PO TABS: ORAL_TABLET | ORAL | 3 refills | Status: DC

## 2022-10-07 ENCOUNTER — Other Ambulatory Visit: Payer: Self-pay | Admitting: Internal Medicine

## 2022-11-13 ENCOUNTER — Other Ambulatory Visit: Payer: Self-pay | Admitting: Internal Medicine

## 2022-12-23 ENCOUNTER — Other Ambulatory Visit: Payer: Self-pay | Admitting: Internal Medicine

## 2022-12-26 NOTE — Progress Notes (Signed)
CARDIOLOGY CONSULT NOTE       Patient ID: Christopher Richards MRN: [id number] DOB/AGE: Feb 09, 1928 89 y.o.  Admit date: (Not on file) Referring Physician: Lawerance Bach Primary Physician: Pincus Sanes, MD Primary Cardiologist: Eden Emms Reason for Consultation: Afib/Dyspnea    HPI:  89 y.o. referred by Dr Lawerance Bach for dyspnea and afib.  First seen 10/2021 Seen by primary 03/08/21 Had URI symptoms with cough for 2 weeks COVID negative Also had bilateral LE edema right > left with dyspnea with PND and orthopnea  CXR confirmed CHF with CE, diffuse bilateral interstitial opacities and small bilateral effusions likely edema CHADVASC 4 Had [facility] in April 2023 Had another [facility] September 2023 Seen by Dr Nelly Laurence and discussed Tikosyn but does not seem to have been done   He has no prior history of CAD/MI/CHF or arrhythmia   Echo from 03/12/16 showed mild LVH with EF 55-60% and mild AR with normal estimated PA pressure   Appears he was started on eliquis and lasix. Was already on Norvasc, Inderal and Cozaar for HTN   ECG showed afib rate 52 with no acute ST changes   He has noted more edema since first week in December He does not have palpitations and never noted his afib He has a chronic UE essential tremor no Parkinsons  No bleeding issues  His daughter Darel Hong is with him and works for [employer] and looks out for him well and frequently will work from his home He still drives and is widowed Has a lot of anxiety and some panic attacks Wife died of parkinson's earlier this year in April Had been married 69 years   TTE done 03/16/21 with normal EF     ROS All other systems reviewed and negative except as noted above  Past Medical History:  Diagnosis Date   Arthritis    Basal cell cancer    SKIN.Marland KitchenFACE AND HAND   Benign prostatic hypertrophy    Bronchitis    11/2013    Chest pain 02/19/1990   NEGATIVE CATH   Hyperglycemia    PMH of   Hyperlipidemia    Hypertension    Pneumonia 02/20/2007   treated as OP    Tachycardia    hx of per patient     Family History  Problem Relation Age of Onset   Alcohol abuse Brother    Hypertension Mother    Heart disease Mother        AF   Transient ischemic attack Mother    Heart attack Father 54   Hypertension Father    Lung disease Father        ? Black Lung Oceanographer)   Cancer Neg Hx    Diabetes Neg Hx     Social History   Socioeconomic History   Marital status: Widowed    Spouse name: Not on file   Number of children: 2   Years of education: 23   Highest education level: Master's degree (e.g., MA, MS, MEng, MEd, MSW, MBA)  Occupational History   Occupation: [employer]  Tobacco Use   Smoking status: Never   Smokeless tobacco: Never   Tobacco comments:    Never smoke 11/14/21  Vaping Use   Vaping status: Never Used  Substance and Sexual Activity   Alcohol use: No   Drug use: No   Sexual activity: Not Currently  Other Topics Concern   Not on file  Social History Narrative   Denies abuse and feels safe at home.  Social Determinants of Health   Financial Resource Strain: Low Risk  (07/13/2022)   Overall Financial Resource Strain (CARDIA)    Difficulty of Paying Living Expenses: Not hard at all  Food Insecurity: No Food Insecurity (07/13/2022)   Hunger Vital Sign    Worried About Running Out of Food in the Last Year: Never true    Ran Out of Food in the Last Year: Never true  Transportation Needs: No Transportation Needs (05/28/2022)   PRAPARE - Administrator, Civil Service (Medical): No    Lack of Transportation (Non-Medical): No  Physical Activity: Inactive (07/13/2022)   Exercise Vital Sign    Days of Exercise per Week: 0 days    Minutes of Exercise per Session: 0 min  Stress: Stress Concern Present (07/13/2022)   [employer] of Occupational Health - Occupational Stress Questionnaire    Feeling of Stress : Very much  Social Connections: Moderately Isolated (07/13/2022)   Social Connection and Isolation  Panel [NHANES]    Frequency of Communication with Friends and Family: More than three times a week    Frequency of Social Gatherings with Friends and Family: More than three times a week    Attends Religious Services: 1 to 4 times per year    Active Member of [employer] or Organizations: No    Attends Banker Meetings: Never    Marital Status: Widowed  Intimate Partner Violence: Not At Risk (07/13/2022)   Humiliation, Afraid, Rape, and Kick questionnaire    Fear of Current or Ex-Partner: No    Emotionally Abused: No    Physically Abused: No    Sexually Abused: No    Past Surgical History:  Procedure Laterality Date   BASAL CELL CARCINOMA EXCISION     X 3   CARDIOVERSION N/A 06/07/2021   Procedure: CARDIOVERSION;  Surgeon: Jodelle Red, MD;  Location: [facility] ENDOSCOPY;  Service: Cardiovascular;  Laterality: N/A;  0905 shocked @120j  0906 shocked @150j , NSR   CARDIOVERSION N/A 10/30/2021   Procedure: CARDIOVERSION;  Surgeon: Chrystie Nose, MD;  Location: [facility] ENDOSCOPY;  Service: Cardiovascular;  Laterality: N/A;   cataract surgery      bilateral   COLONOSCOPY  2003   [facility]   HERNIA REPAIR  1972   Dr  Francina Ames   INGUINAL HERNIA REPAIR Right 04/06/2014   Procedure: REPAIR RIGHT INGUINAL HERNIA WITH MESH;  Surgeon: Avel Peace, MD;  Location: [facility] ORS;  Service: General;  Laterality: Right;   INSERTION OF MESH Right 04/06/2014   Procedure: INSERTION OF MESH;  Surgeon: Avel Peace, MD;  Location: [facility] ORS;  Service: General;  Laterality: Right;      Current Outpatient Medications:    ALOE-SODIUM CHLORIDE NA, Place 1 spray into the nose at bedtime. Saline Nasal Spray With Aloe, Disp: , Rfl:    amLODipine (NORVASC) 2.5 MG tablet, TAKE 1 TABLET BY MOUTH EVERY DAY, Disp: 90 tablet, Rfl: 3   Cholecalciferol (VITAMIN D) 2000 UNITS tablet, Take 2,000 Units by mouth daily., Disp: , Rfl:    Cyanocobalamin (VITAMIN B-12 PO), Take 1 tablet by mouth daily., Disp: , Rfl:     ELIQUIS 5 MG TABS tablet, TAKE 1 TABLET BY MOUTH TWICE A DAY, Disp: 60 tablet, Rfl: 5   finasteride (PROSCAR) 5 MG tablet, Take 5 mg by mouth daily., Disp: , Rfl:    furosemide (LASIX) 20 MG tablet, ALTERNATE BETWEEN TAKING 2 TABLETS AND 3 TABLETS BY MOUTH DAILY, Disp: 135 tablet, Rfl: 1  furosemide (LASIX) 40 MG tablet, Take 40 mg alternating with 60 mg daily, Disp: 90 tablet, Rfl: 3   hydrALAZINE (APRESOLINE) 25 MG tablet, TAKE 1 TABLET BY MOUTH THREE TIMES A DAY, Disp: 270 tablet, Rfl: 1   losartan (COZAAR) 100 MG tablet, TAKE 1 TABLET BY MOUTH EVERY DAY, Disp: 90 tablet, Rfl: 1   Sodium Chloride-Xylitol (XLEAR SINUS CARE SPRAY NA), Place 1-2 sprays into the nose daily as needed (sinus issues.)., Disp: , Rfl:    tamsulosin (FLOMAX) 0.4 MG CAPS capsule, Take 0.4 mg by mouth daily., Disp: , Rfl:    WIXELA INHUB 100-50 MCG/ACT AEPB, TAKE 1 PUFF BY MOUTH TWICE A DAY, Disp: 60 each, Rfl: 1    Physical Exam: Blood pressure (!) 180/88, pulse 60, resp. rate 16, height 5\' 5"  (1.651 m), weight 151 lb 3.2 oz (68.6 kg), SpO2 97%.   Frail elderly male Basilar rales SEM Abdomen benign  R> L LE edema   Labs:   Lab Results  Component Value Date   WBC 9.4 05/22/2022   HGB 13.9 05/22/2022   HCT 41.8 05/22/2022   MCV 90.2 05/22/2022   PLT 275.0 05/22/2022    No results for input(s): "NA", "K", "CL", "CO2", "BUN", "CREATININE", "CALCIUM", "PROT", "BILITOT", "ALKPHOS", "ALT", "AST", "GLUCOSE" in the last 168 hours.  Invalid input(s): "LABALBU"  No results found for: "CKTOTAL", "CKMB", "CKMBINDEX", "TROPONINI"  Lab Results  Component Value Date   CHOL 175 01/29/2020   CHOL 172 07/30/2019   CHOL 180 01/29/2019   Lab Results  Component Value Date   HDL 37.00 (L) 01/29/2020   HDL 35.10 (L) 07/30/2019   HDL 44.50 01/29/2019   Lab Results  Component Value Date   LDLCALC 116 (H) 01/29/2020   LDLCALC 118 (H) 07/30/2019   LDLCALC 118 (H) 01/29/2019   Lab Results  Component Value Date    TRIG 110.0 01/29/2020   TRIG 94.0 07/30/2019   TRIG 90.0 01/29/2019   Lab Results  Component Value Date   CHOLHDL 5 01/29/2020   CHOLHDL 5 07/30/2019   CHOLHDL 4 01/29/2019   No results found for: "LDLDIRECT"    Radiology: No results found.  EKG 12/28/2022 afib rate 60 no acute ST changes    ASSESSMENT AND PLAN:   AFib:  with SSS limits AAT choices Seen by Mealor and suggested Tikosyn Patient deferred during wife's illness. He is asymptomatic and at 76 he and I agreed that just leaving him in rate controlled afib would be sufficient He does not want to be hospitalized for Tikosyn and have another [facility] CHF:  EF normal by TTE 03/16/21 improved ? Related to rhythm Update echo if symptoms recur   F/U EP and me in a year  Signed: Charlton Haws 12/28/2022, 10:29 AM

## 2022-12-28 ENCOUNTER — Ambulatory Visit: Payer: Medicare Other | Attending: Cardiovascular Disease | Admitting: Cardiovascular Disease

## 2022-12-28 VITALS — BP 180/88 | HR 60 | Resp 16 | Ht 65.0 in | Wt 151.2 lb

## 2022-12-28 DIAGNOSIS — I509 Heart failure, unspecified: Secondary | ICD-10-CM

## 2022-12-28 DIAGNOSIS — I4819 Other persistent atrial fibrillation: Secondary | ICD-10-CM

## 2022-12-28 NOTE — Patient Instructions (Addendum)
Medication Instructions:  Your physician recommends that you continue on your current medications as directed. Please refer to the Current Medication list given to you today.  *If you need a refill on your cardiac medications before your next appointment, please call your pharmacy*  Lab Work: If you have labs (blood work) drawn today and your tests are completely normal, you will receive your results only by: MyChart Message (if you have MyChart) OR A paper copy in the mail If you have any lab test that is abnormal or we need to change your treatment, we will call you to review the results.  Follow-Up: At [facility], you and your health needs are our priority.  As part of our continuing mission to provide you with exceptional heart care, we have created designated Provider Care Teams.  These Care Teams include your primary Cardiologist (physician) and Advanced Practice Providers (APPs -  Physician Assistants and Nurse Practitioners) who all work together to provide you with the care you need, when you need it.  We recommend signing up for the patient portal called "MyChart".  Sign up information is provided on this After Visit Summary.  MyChart is used to connect with patients for Virtual Visits (Telemedicine).  Patients are able to view lab/test results, encounter notes, upcoming appointments, etc.  Non-urgent messages can be sent to your provider as well.   To learn more about what you can do with MyChart, go to [email or web address].    Your next appointment:   6 month(s)  Provider:   Peter Nishan, MD     

## 2023-01-10 DIAGNOSIS — R3912 Poor urinary stream: Secondary | ICD-10-CM | POA: Diagnosis not present

## 2023-01-11 ENCOUNTER — Other Ambulatory Visit: Payer: Self-pay | Admitting: Internal Medicine

## 2023-02-18 ENCOUNTER — Encounter: Payer: Self-pay | Admitting: Internal Medicine

## 2023-03-01 ENCOUNTER — Ambulatory Visit: Payer: Medicare Other | Admitting: Internal Medicine

## 2023-03-03 ENCOUNTER — Encounter: Payer: Self-pay | Admitting: Internal Medicine

## 2023-03-03 NOTE — Progress Notes (Signed)
 Subjective:    Patient ID: Christopher Richards, male    DOB: [birth year], 89 y.o.   MRN: [id number]      HPI Christopher Richards is here for  Chief Complaint  Patient presents with   Fall  She is here with her daughter who helps supplement the history.  Fall -he fell the Saturday before new years - he was a little confused and lethargic.  He did not want to take his medications for a few days-his daughter states he was hard to wake up for 3 days.  He ended up sleeping and did feel better.  During that time he was off his medications for a few days except for the Eliquis .  That is when his leg started to swell-right leg much more than left leg.  He has been taking all of his medications for the past few days but has not seen much improvement in the right leg swelling.  He does sit a lot and does not always elevate his legs.  He does walk around a little bit in the house, but does no other exercise.  He has had significant right leg swelling like this in the past-over the summer.  At that point ultrasound of the lower extremity was negative for DVT and increasing the Lasix  helped.  He does have a cough and it is productive at times when he uses the inhaler.  He does not feel like the Napoleon is working as well as the Advair  inhaler did in the past.       Medications and allergies reviewed with patient and updated if appropriate.  Current Outpatient Medications on File Prior to Visit  Medication Sig Dispense Refill   [employer] MAGNESIUM GLYCINATE PO Take 1,330 mg by mouth daily.     Lutein 40 MG CAPS Take by mouth.     ALOE-SODIUM CHLORIDE  NA Place 1 spray into the nose at bedtime. Saline Nasal Spray With Aloe     Cholecalciferol (VITAMIN D) 2000 UNITS tablet Take 2,000 Units by mouth daily.     Cyanocobalamin  (VITAMIN B-12 PO) Take 1 tablet by mouth daily.     ELIQUIS  5 MG TABS tablet TAKE 1 TABLET BY MOUTH TWICE A DAY 60 tablet 5   finasteride (PROSCAR) 5 MG tablet Take 5 mg by mouth daily.     furosemide   (LASIX ) 20 MG tablet ALTERNATE BETWEEN TAKING 2 TABLETS AND 3 TABLETS BY MOUTH DAILY 135 tablet 1   furosemide  (LASIX ) 40 MG tablet Take 40 mg alternating with 60 mg daily 90 tablet 3   hydrALAZINE  (APRESOLINE ) 25 MG tablet TAKE 1 TABLET BY MOUTH THREE TIMES A DAY 270 tablet 1   losartan  (COZAAR ) 100 MG tablet TAKE 1 TABLET BY MOUTH EVERY DAY 90 tablet 1   Sodium Chloride -Xylitol (XLEAR SINUS CARE SPRAY NA) Place 1-2 sprays into the nose daily as needed (sinus issues.).     tamsulosin  (FLOMAX ) 0.4 MG CAPS capsule Take 0.4 mg by mouth daily.     No current facility-administered medications on file prior to visit.    Review of Systems  Constitutional:  Positive for fatigue. Negative for fever.  Respiratory:  Positive for cough (productive at times with inhaler). Negative for shortness of breath and wheezing.   Cardiovascular:  Positive for leg swelling. Negative for chest pain and palpitations.  Gastrointestinal:  Negative for abdominal pain.  Neurological:  Negative for dizziness, weakness, light-headedness, numbness and headaches.       Objective:   Vitals:  03/04/23 1559  BP: 138/76  Pulse: 63  Temp: 98.3 F (36.8 C)  SpO2: 98%   BP Readings from Last 3 Encounters:  03/04/23 138/76  12/28/22 (!) 180/88  07/24/22 116/80   Wt Readings from Last 3 Encounters:  03/04/23 160 lb (72.6 kg)  12/28/22 151 lb 3.2 oz (68.6 kg)  07/24/22 155 lb (70.3 kg)   Body mass index is 26.63 kg/m.    Physical Exam Constitutional:      General: He is not in acute distress.    Appearance: Normal appearance. He is not ill-appearing.  HENT:     Head: Normocephalic and atraumatic.  Cardiovascular:     Rate and Rhythm: Normal rate and regular rhythm.     Heart sounds: Murmur heard.  Pulmonary:     Effort: Pulmonary effort is normal. No respiratory distress.     Breath sounds: No wheezing.     Comments: Slightly decreased breath sounds by lateral bases,?  Slightly coarse breath sounds  bilaterally Musculoskeletal:     Right lower leg: Edema (1-2+ extending past the knee into the posterior upper leg) present.     Left lower leg: Edema (1+ round ankle and just above ankle) present.  Skin:    General: Skin is warm and dry.     Findings: Erythema (Mild erythema bilateral lower extremity secondary to stasis dermatitis) present. No rash.  Neurological:     Mental Status: He is alert.            Assessment & Plan:    See Problem List for Assessment and Plan of chronic medical problems.

## 2023-03-04 ENCOUNTER — Ambulatory Visit (INDEPENDENT_AMBULATORY_CARE_PROVIDER_SITE_OTHER): Payer: Medicare Other | Admitting: Internal Medicine

## 2023-03-04 ENCOUNTER — Ambulatory Visit: Payer: Medicare Other | Admitting: Nurse Practitioner

## 2023-03-04 ENCOUNTER — Ambulatory Visit: Payer: Medicare Other | Admitting: Internal Medicine

## 2023-03-04 VITALS — BP 138/76 | HR 63 | Temp 98.3°F | Ht 65.0 in | Wt 160.0 lb

## 2023-03-04 DIAGNOSIS — I10 Essential (primary) hypertension: Secondary | ICD-10-CM

## 2023-03-04 DIAGNOSIS — I5033 Acute on chronic diastolic (congestive) heart failure: Secondary | ICD-10-CM | POA: Diagnosis not present

## 2023-03-04 DIAGNOSIS — R600 Localized edema: Secondary | ICD-10-CM

## 2023-03-04 MED ORDER — FLUTICASONE-SALMETEROL 100-50 MCG/ACT IN AEPB: 1.0000 | INHALATION_SPRAY | Freq: Two times a day (BID) | RESPIRATORY_TRACT | 11 refills | Status: DC

## 2023-03-04 NOTE — Assessment & Plan Note (Signed)
Chronic Blood pressure well controlled Continue hydralazine 25 mg 3 times daily, losartan 100 mg daily

## 2023-03-04 NOTE — Assessment & Plan Note (Signed)
 Chronic RLE >> LLE Increased edema recently likely secondary to acute on chronic diastolic heart failure secondary to not taking his medications for few days Increase Lasix  to 60 mg daily x 2 days then resume 40 mg daily Elevate legs ?  Cause of right lower extremity being much larger than left lower extremity-consider vascular evaluation-?  Pelvic congestion syndrome versus severe reflux

## 2023-03-04 NOTE — Patient Instructions (Addendum)
      Blood work was ordered.       Medications changes include :   change wixela to advair.   Take lasix 60 mg x 2 days then 40 mg daily

## 2023-03-04 NOTE — Assessment & Plan Note (Addendum)
 Acute on chronic HFpEF He did not take his Lasix  for a few days and had increased right >> left lower extremity edema.  He also states that his inhaler is not working as well and I believe that he has some fluid in the lungs on exam-likely all related to acute on chronic diastolic heart failure Increase Lasix  to 60 mg x 2 days and then resume 40 mg daily Elevate legs CBC, CMP, BNP in a couple of days Prior to missing his medications his legs were at their baseline-right lower extremity is always larger than left lower extremity His daughter can update me with his progress-follow-up in at least 1 month-sooner if needed

## 2023-03-06 ENCOUNTER — Encounter: Payer: Self-pay | Admitting: Internal Medicine

## 2023-03-08 ENCOUNTER — Other Ambulatory Visit (INDEPENDENT_AMBULATORY_CARE_PROVIDER_SITE_OTHER): Payer: Medicare Other

## 2023-03-08 ENCOUNTER — Other Ambulatory Visit: Payer: Self-pay | Admitting: Internal Medicine

## 2023-03-08 DIAGNOSIS — R600 Localized edema: Secondary | ICD-10-CM | POA: Diagnosis not present

## 2023-03-08 LAB — CBC WITH DIFFERENTIAL/PLATELET
Basophils Absolute: 0 10*3/uL (ref 0.0–0.1)
Basophils Relative: 0.5 % (ref 0.0–3.0)
Eosinophils Absolute: 0.2 10*3/uL (ref 0.0–0.7)
Eosinophils Relative: 2.7 % (ref 0.0–5.0)
HCT: 33.2 % — ABNORMAL LOW (ref 39.0–52.0)
Hemoglobin: 10.8 g/dL — ABNORMAL LOW (ref 13.0–17.0)
Lymphocytes Relative: 16.2 % (ref 12.0–46.0)
Lymphs Abs: 1.2 10*3/uL (ref 0.7–4.0)
MCHC: 32.6 g/dL (ref 30.0–36.0)
MCV: 92.4 fL (ref 78.0–100.0)
Monocytes Absolute: 0.7 10*3/uL (ref 0.1–1.0)
Monocytes Relative: 9.1 % (ref 3.0–12.0)
Neutro Abs: 5.2 10*3/uL (ref 1.4–7.7)
Neutrophils Relative %: 71.5 % (ref 43.0–77.0)
Platelets: 302 10*3/uL (ref 150.0–400.0)
RBC: 3.6 Mil/uL — ABNORMAL LOW (ref 4.22–5.81)
RDW: 14.9 % (ref 11.5–15.5)
WBC: 7.2 10*3/uL (ref 4.0–10.5)

## 2023-03-08 LAB — COMPREHENSIVE METABOLIC PANEL
ALT: 19 U/L (ref 0–53)
AST: 21 U/L (ref 0–37)
Albumin: 3.8 g/dL (ref 3.5–5.2)
Alkaline Phosphatase: 71 U/L (ref 39–117)
BUN: 28 mg/dL — ABNORMAL HIGH (ref 6–23)
CO2: 29 meq/L (ref 19–32)
Calcium: 9.5 mg/dL (ref 8.4–10.5)
Chloride: 105 meq/L (ref 96–112)
Creatinine, Ser: 1.38 mg/dL (ref 0.40–1.50)
GFR: 44.14 mL/min — ABNORMAL LOW (ref >60.00)
Glucose, Bld: 121 mg/dL — ABNORMAL HIGH (ref 70–99)
Potassium: 4.9 meq/L (ref 3.5–5.1)
Sodium: 140 meq/L (ref 135–145)
Total Bilirubin: 0.9 mg/dL (ref 0.2–1.2)
Total Protein: 6.7 g/dL (ref 6.0–8.3)

## 2023-03-08 LAB — BRAIN NATRIURETIC PEPTIDE: Pro B Natriuretic peptide (BNP): 1555 pg/mL — ABNORMAL HIGH (ref 0.0–100.0)

## 2023-03-08 MED ORDER — FUROSEMIDE 40 MG PO TABS: 40.0000 mg | ORAL_TABLET | Freq: Every day | ORAL | Status: DC

## 2023-03-11 ENCOUNTER — Encounter: Payer: Self-pay | Admitting: Internal Medicine

## 2023-03-27 DIAGNOSIS — H1033 Unspecified acute conjunctivitis, bilateral: Secondary | ICD-10-CM | POA: Diagnosis not present

## 2023-04-13 ENCOUNTER — Other Ambulatory Visit: Payer: Self-pay | Admitting: Internal Medicine

## 2023-04-16 ENCOUNTER — Other Ambulatory Visit: Payer: Self-pay | Admitting: Internal Medicine

## 2023-04-28 ENCOUNTER — Encounter: Payer: Self-pay | Admitting: Internal Medicine

## 2023-04-29 ENCOUNTER — Encounter: Payer: Self-pay | Admitting: Internal Medicine

## 2023-04-29 ENCOUNTER — Ambulatory Visit: Payer: Self-pay | Admitting: Internal Medicine

## 2023-04-29 NOTE — Telephone Encounter (Signed)
 Copied from CRM [id number]. Topic: Clinical - Red Word Triage >> Apr 29, 2023  3:20 PM [place] wrote: Red Word that prompted transfer to Nurse Triage: Patients leg is huge again & beginning to weep. He is much weaker. He fell Friday & is using a walker now. When he fell he hit his forehead; got a huge egg on his forehead; never lost consciousness staying alert & oriented. He now has a black eye with bruising going to chin. Refused to go to hospital- copied notes from daughters message.    Chief Complaint: leg swelling, recent fall Symptoms: leg swelling, weeping fluid Pertinent Negatives: Patient denies pain, redness Disposition: [] ED /[] Urgent Care (no appt availability in office) / [x] Appointment(In office/virtual)/ []  [facility]/ [] Home Care/ [] Refused Recommended Disposition /[] [facility]/ []  Follow-up with PCP Additional Notes: Patient's daughter called the office. She initially sent the office a mychart message regarding the situation. She stated that patient had a recent fall on Friday where he hit his forehead. He has some facial bruising. Patient did not lose consciousness. He is alert and oriented. Patient has no pain. Patient's daughter is concerned due to swelling of patient's leg. She stated he also has bumps on his leg that is weeping small amount of fluid. Patient refused to go to hospital. Office is aware of refusal and the office staff instructed patient to be seen in office. Appointment scheduled for tomorrow morning.     Reason for Disposition  [1] MODERATE leg swelling (e.g., swelling extends up to knees) AND [2] new-onset or worsening  Answer Assessment - Initial Assessment Questions 1. LOCATION: "What part of the body hit the ground?" (e.g., back, buttocks, head, hips, knees, hands, head, stomach)     Hit forehead  2. INJURY: "Did you hurt (injure) yourself when you fell?" If Yes, ask: "What did you injure? Tell me more about this?" (e.g., body  area; type of injury; pain severity)"     Patient has a black eye/bruising from forehead to chin  3. PAIN: "Is there any pain?" If Yes, ask: "How bad is the pain?" (e.g., Scale 1-10; or mild,  moderate, severe)   - NONE (0): No pain   - MILD (1-3): Doesn't interfere with normal activities    - MODERATE (4-7): Interferes with normal activities or awakens from sleep    - SEVERE (8-10): Excruciating pain, unable to do any normal activities      No  4. OTHER SYMPTOMS: "Do you have any other symptoms?" (e.g., dizziness, fever, weakness; new onset or worsening).      Leg swelling  Answer Assessment - Initial Assessment Questions 1. ONSET: "When did the swelling start?" (e.g., minutes, hours, days)     Patient's daughter stated the swelling is always there but it has gotten worse  2. SEVERITY: "How bad is the swelling?" (e.g., localized; mild, moderate, severe)   - Localized: Small area of swelling localized to one leg.   - MILD pedal edema: Swelling limited to foot and ankle, pitting edema < 1/4 inch (6 mm) deep, rest and elevation eliminate most or all swelling.   - MODERATE edema: Swelling of lower leg to knee, pitting edema > 1/4 inch (6 mm) deep, rest and elevation only partially reduce swelling.   - SEVERE edema: Swelling extends above knee, facial or hand swelling present.      The leg has some bumps and is weeping small amounts of fluid  3. REDNESS: "Does the swelling look red or  infected?"     No  4. PAIN: "Is the swelling painful to touch?" If Yes, ask: "How painful is it?"   (Scale 1-10; mild, moderate or severe)     No pain  Protocols used: Falls and Falling-A-AH, Leg Swelling and Edema-A-AH

## 2023-04-29 NOTE — Progress Notes (Unsigned)
    Subjective:    Patient ID: Christopher Richards, male    DOB: 04-07-1927, 89 y.o.   MRN: [id number]      HPI Christopher Richards is here for No chief complaint on file.   Recent fall -    Leg swelling -   In past skipped meds and had acute hf     Medications and allergies reviewed with patient and updated if appropriate.  Current Outpatient Medications on File Prior to Visit  Medication Sig Dispense Refill   ALOE-SODIUM CHLORIDE NA Place 1 spray into the nose at bedtime. Saline Nasal Spray With Aloe     Cholecalciferol (VITAMIN D) 2000 UNITS tablet Take 2,000 Units by mouth daily.     [employer]S MAGNESIUM GLYCINATE PO Take 1,330 mg by mouth daily.     Cyanocobalamin (VITAMIN B-12 PO) Take 1 tablet by mouth daily.     ELIQUIS 5 MG TABS tablet TAKE 1 TABLET BY MOUTH TWICE A DAY 60 tablet 5   finasteride (PROSCAR) 5 MG tablet Take 5 mg by mouth daily.     fluticasone-salmeterol (ADVAIR) 100-50 MCG/ACT AEPB Inhale 1 puff into the lungs 2 (two) times daily. 60 each 11   furosemide (LASIX) 40 MG tablet Take 1 tablet (40 mg total) by mouth daily.     hydrALAZINE (APRESOLINE) 25 MG tablet TAKE 1 TABLET BY MOUTH THREE TIMES A DAY 270 tablet 1   losartan (COZAAR) 100 MG tablet TAKE 1 TABLET BY MOUTH EVERY DAY 90 tablet 1   Lutein 40 MG CAPS Take by mouth.     Sodium Chloride-Xylitol (XLEAR SINUS CARE SPRAY NA) Place 1-2 sprays into the nose daily as needed (sinus issues.).     tamsulosin (FLOMAX) 0.4 MG CAPS capsule Take 0.4 mg by mouth daily.     No current facility-administered medications on file prior to visit.    Review of Systems     Objective:  There were no vitals filed for this visit. BP Readings from Last 3 Encounters:  03/04/23 138/76  12/28/22 (!) 180/88  07/24/22 116/80   Wt Readings from Last 3 Encounters:  03/04/23 160 lb (72.6 kg)  12/28/22 151 lb 3.2 oz (68.6 kg)  07/24/22 155 lb (70.3 kg)   There is no height or weight on file to calculate BMI.    Physical Exam          Assessment & Plan:    See Problem List for Assessment and Plan of chronic medical problems.

## 2023-04-30 ENCOUNTER — Encounter: Payer: Self-pay | Admitting: Internal Medicine

## 2023-04-30 ENCOUNTER — Ambulatory Visit (INDEPENDENT_AMBULATORY_CARE_PROVIDER_SITE_OTHER): Admitting: Internal Medicine

## 2023-04-30 ENCOUNTER — Ambulatory Visit (HOSPITAL_COMMUNITY)
Admission: RE | Admit: 2023-04-30 | Discharge: 2023-04-30 | Disposition: A | Discharge status: Home / Self Care | Source: Ambulatory Visit | Attending: Internal Medicine | Admitting: Internal Medicine

## 2023-04-30 VITALS — BP 130/70 | HR 68 | Temp 98.4°F | Ht 65.0 in | Wt 155.0 lb

## 2023-04-30 DIAGNOSIS — S0990XA Unspecified injury of head, initial encounter: Secondary | ICD-10-CM | POA: Diagnosis not present

## 2023-04-30 DIAGNOSIS — R600 Localized edema: Secondary | ICD-10-CM | POA: Diagnosis not present

## 2023-04-30 DIAGNOSIS — S0003XA Contusion of scalp, initial encounter: Secondary | ICD-10-CM

## 2023-04-30 DIAGNOSIS — R0609 Other forms of dyspnea: Secondary | ICD-10-CM

## 2023-04-30 LAB — COMPREHENSIVE METABOLIC PANEL
ALT: 19 U/L (ref 0–53)
AST: 22 U/L (ref 0–37)
Albumin: 4 g/dL (ref 3.5–5.2)
Alkaline Phosphatase: 69 U/L (ref 39–117)
BUN: 25 mg/dL — ABNORMAL HIGH (ref 6–23)
CO2: 27 meq/L (ref 19–32)
Calcium: 9.8 mg/dL (ref 8.4–10.5)
Chloride: 105 meq/L (ref 96–112)
Creatinine, Ser: 1.3 mg/dL (ref 0.40–1.50)
GFR: 47.37 mL/min — ABNORMAL LOW (ref >60.00)
Glucose, Bld: 122 mg/dL — ABNORMAL HIGH (ref 70–99)
Potassium: 4.5 meq/L (ref 3.5–5.1)
Sodium: 139 meq/L (ref 135–145)
Total Bilirubin: 0.9 mg/dL (ref 0.2–1.2)
Total Protein: 6.9 g/dL (ref 6.0–8.3)

## 2023-04-30 LAB — CBC WITH DIFFERENTIAL/PLATELET
Basophils Absolute: 0 10*3/uL (ref 0.0–0.1)
Basophils Relative: 0.3 % (ref 0.0–3.0)
Eosinophils Absolute: 0.3 10*3/uL (ref 0.0–0.7)
Eosinophils Relative: 3.5 % (ref 0.0–5.0)
HCT: 33.3 % — ABNORMAL LOW (ref 39.0–52.0)
Hemoglobin: 10.6 g/dL — ABNORMAL LOW (ref 13.0–17.0)
Lymphocytes Relative: 11.8 % — ABNORMAL LOW (ref 12.0–46.0)
Lymphs Abs: 0.9 10*3/uL (ref 0.7–4.0)
MCHC: 31.9 g/dL (ref 30.0–36.0)
MCV: 81.8 fl (ref 78.0–100.0)
Monocytes Absolute: 0.7 10*3/uL (ref 0.1–1.0)
Monocytes Relative: 9.2 % (ref 3.0–12.0)
Neutro Abs: 5.8 10*3/uL (ref 1.4–7.7)
Neutrophils Relative %: 75.2 % (ref 43.0–77.0)
Platelets: 249 10*3/uL (ref 150.0–400.0)
RBC: 4.08 Mil/uL — ABNORMAL LOW (ref 4.22–5.81)
RDW: 17 % — ABNORMAL HIGH (ref 11.5–15.5)
WBC: 7.7 10*3/uL (ref 4.0–10.5)

## 2023-04-30 LAB — BRAIN NATRIURETIC PEPTIDE: Pro B Natriuretic peptide (BNP): 1106 pg/mL — ABNORMAL HIGH (ref 0.0–100.0)

## 2023-04-30 MED ORDER — FUROSEMIDE 40 MG PO TABS: ORAL_TABLET | ORAL | 2 refills | Status: AC

## 2023-04-30 MED ORDER — FUROSEMIDE 20 MG PO TABS: ORAL_TABLET | ORAL | Status: DC

## 2023-04-30 NOTE — Assessment & Plan Note (Signed)
 Acute Fell 4 days ago and hit the left forehead-hematoma present Bruising left side of face down to the neck No headaches-just tenderness where he hit No lightheadedness/dizziness/blurry vision/nausea No confusion His daughter did hold his Eliquis that morning Since has been 4 days we will hold off on imaging and his daughter will continue to monitor

## 2023-04-30 NOTE — Assessment & Plan Note (Addendum)
 Acute on chronic Has chronic edema in both legs, but intermittently right leg is worse and goes up and down Recently RLE edema has been worse and persistent-has also started weeping Recent leg trauma with fall-weeping starting after that Has been on Eliquis-1 dose held just after he fell and hit his head, but need to rule out DVT-ultrasound right lower extremity ordered Increase Lasix to 40 mg twice daily for today-will instruct further after blood work returns

## 2023-04-30 NOTE — Assessment & Plan Note (Signed)
 Acute on chronic Likely acute on chronic diastolic heart failure Having increased right lower extremity> left lower extremity edema, dyspnea on exertion and orthopnea Currently taking 40 mg of Lasix in the morning and 20 mg in the afternoon-this afternoon he will take 40 mg instead of the 20 CBC, CMP, BNP today Rule out right lower extremity DVT Will adjust Lasix depending on kidney function hen hopefully he will not need to be hospitalized Elevate legs when sitting

## 2023-04-30 NOTE — Progress Notes (Signed)
 Patient is in office today for a nurse visit for B12 Injection. Patient Injection was given in the  Right arm. Patient tolerated injection well.

## 2023-04-30 NOTE — Patient Instructions (Addendum)
      Blood work was ordered.       Medications changes include :   None    An [name] of your leg was ordered.

## 2023-04-30 NOTE — Addendum Note (Signed)
 Addended by: Pincus Sanes on: 04/30/2023 01:12 PM   Modules accepted: Orders

## 2023-05-08 ENCOUNTER — Encounter: Payer: Self-pay | Admitting: Internal Medicine

## 2023-05-15 DIAGNOSIS — L720 Epidermal cyst: Secondary | ICD-10-CM | POA: Diagnosis not present

## 2023-05-15 DIAGNOSIS — X32XXXA Exposure to sunlight, initial encounter: Secondary | ICD-10-CM | POA: Diagnosis not present

## 2023-05-15 DIAGNOSIS — L570 Actinic keratosis: Secondary | ICD-10-CM | POA: Diagnosis not present

## 2023-05-23 ENCOUNTER — Encounter: Payer: Self-pay | Admitting: Internal Medicine

## 2023-06-12 ENCOUNTER — Encounter: Payer: Self-pay | Admitting: Internal Medicine

## 2023-06-12 DIAGNOSIS — R350 Frequency of micturition: Secondary | ICD-10-CM

## 2023-06-17 ENCOUNTER — Encounter: Payer: Self-pay | Admitting: Internal Medicine

## 2023-06-17 DIAGNOSIS — R410 Disorientation, unspecified: Secondary | ICD-10-CM | POA: Insufficient documentation

## 2023-06-17 NOTE — Patient Instructions (Incomplete)
      Blood work was ordered.       Medications changes include :   None    A referral was ordered neurology and someone will call you to schedule an appointment.

## 2023-06-17 NOTE — Progress Notes (Unsigned)
    Subjective:    Patient ID: Christopher Richards, male    DOB: 1927/03/28, 89 y.o.   MRN: [id number]      HPI Christopher Richards is here for No chief complaint on file.   Cognitive changes     Medications and allergies reviewed with patient and updated if appropriate.  Current Outpatient Medications on File Prior to Visit  Medication Sig Dispense Refill   ALOE-SODIUM CHLORIDE  NA Place 1 spray into the nose at bedtime. Saline Nasal Spray With Aloe     Cholecalciferol (VITAMIN D) 2000 UNITS tablet Take 2,000 Units by mouth daily.     [facility] MAGNESIUM GLYCINATE PO Take 1,330 mg by mouth daily.     Cyanocobalamin (VITAMIN B-12 PO) Take 1 tablet by mouth daily.     ELIQUIS  5 MG TABS tablet TAKE 1 TABLET BY MOUTH TWICE A DAY 60 tablet 5   finasteride (PROSCAR) 5 MG tablet Take 5 mg by mouth daily.     fluticasone -salmeterol (ADVAIR) 100-50 MCG/ACT AEPB Inhale 1 puff into the lungs 2 (two) times daily. 60 each 11   furosemide  (LASIX ) 20 MG tablet Take one tab daily in afternoon     furosemide  (LASIX ) 40 MG tablet Take 1 tab daily in morning 90 tablet 2   hydrALAZINE  (APRESOLINE ) 25 MG tablet TAKE 1 TABLET BY MOUTH THREE TIMES A DAY 270 tablet 1   losartan  (COZAAR ) 100 MG tablet TAKE 1 TABLET BY MOUTH EVERY DAY 90 tablet 1   Lutein 40 MG CAPS Take by mouth.     Sodium Chloride -Xylitol (XLEAR SINUS CARE SPRAY NA) Place 1-2 sprays into the nose daily as needed (sinus issues.).     tamsulosin  (FLOMAX ) 0.4 MG CAPS capsule Take 0.4 mg by mouth daily.     tobramycin-dexamethasone (TOBRADEX) ophthalmic solution 1 drop 3 (three) times daily.     No current facility-administered medications on file prior to visit.    Review of Systems     Objective:  There were no vitals filed for this visit. BP Readings from Last 3 Encounters:  04/30/23 130/70  03/04/23 138/76  12/28/22 (!) 180/88   Wt Readings from Last 3 Encounters:  04/30/23 155 lb (70.3 kg)  03/04/23 160 lb (72.6 kg)  12/28/22 151 lb 3.2 oz (68.6  kg)   There is no height or weight on file to calculate BMI.    Physical Exam         Assessment & Plan:    See Problem List for Assessment and Plan of chronic medical problems.

## 2023-06-18 ENCOUNTER — Other Ambulatory Visit (INDEPENDENT_AMBULATORY_CARE_PROVIDER_SITE_OTHER)

## 2023-06-18 ENCOUNTER — Ambulatory Visit (INDEPENDENT_AMBULATORY_CARE_PROVIDER_SITE_OTHER): Admitting: Internal Medicine

## 2023-06-18 VITALS — BP 118/80 | HR 97 | Temp 97.9°F | Ht 65.0 in | Wt 147.0 lb

## 2023-06-18 DIAGNOSIS — R413 Other amnesia: Secondary | ICD-10-CM

## 2023-06-18 DIAGNOSIS — R0609 Other forms of dyspnea: Secondary | ICD-10-CM

## 2023-06-18 DIAGNOSIS — E7849 Other hyperlipidemia: Secondary | ICD-10-CM | POA: Diagnosis not present

## 2023-06-18 DIAGNOSIS — I4819 Other persistent atrial fibrillation: Secondary | ICD-10-CM | POA: Diagnosis not present

## 2023-06-18 DIAGNOSIS — I10 Essential (primary) hypertension: Secondary | ICD-10-CM | POA: Diagnosis not present

## 2023-06-18 DIAGNOSIS — R410 Disorientation, unspecified: Secondary | ICD-10-CM

## 2023-06-18 DIAGNOSIS — R7303 Prediabetes: Secondary | ICD-10-CM | POA: Diagnosis not present

## 2023-06-18 DIAGNOSIS — R600 Localized edema: Secondary | ICD-10-CM | POA: Diagnosis not present

## 2023-06-18 DIAGNOSIS — I5032 Chronic diastolic (congestive) heart failure: Secondary | ICD-10-CM

## 2023-06-18 LAB — LIPID PANEL
Cholesterol: 140 mg/dL (ref 0–200)
HDL: 46.2 mg/dL (ref >39.00)
LDL Cholesterol: 69 mg/dL (ref 0–99)
NonHDL: 93.65
Total CHOL/HDL Ratio: 3
Triglycerides: 124 mg/dL (ref 0.0–149.0)
VLDL: 24.8 mg/dL (ref 0.0–40.0)

## 2023-06-18 LAB — CBC WITH DIFFERENTIAL/PLATELET
Basophils Absolute: 0 10*3/uL (ref 0.0–0.1)
Basophils Relative: 0.4 % (ref 0.0–3.0)
Eosinophils Absolute: 0.3 10*3/uL (ref 0.0–0.7)
Eosinophils Relative: 3.8 % (ref 0.0–5.0)
HCT: 36.9 % — ABNORMAL LOW (ref 39.0–52.0)
Hemoglobin: 11.9 g/dL — ABNORMAL LOW (ref 13.0–17.0)
Lymphocytes Relative: 16.2 % (ref 12.0–46.0)
Lymphs Abs: 1.5 10*3/uL (ref 0.7–4.0)
MCHC: 32.3 g/dL (ref 30.0–36.0)
MCV: 80.4 fl (ref 78.0–100.0)
Monocytes Absolute: 0.7 10*3/uL (ref 0.1–1.0)
Monocytes Relative: 7.2 % (ref 3.0–12.0)
Neutro Abs: 6.6 10*3/uL (ref 1.4–7.7)
Neutrophils Relative %: 72.4 % (ref 43.0–77.0)
Platelets: 289 10*3/uL (ref 150.0–400.0)
RBC: 4.59 Mil/uL (ref 4.22–5.81)
RDW: 20.5 % — ABNORMAL HIGH (ref 11.5–15.5)
WBC: 9.1 10*3/uL (ref 4.0–10.5)

## 2023-06-18 LAB — BASIC METABOLIC PANEL WITH GFR
BUN: 32 mg/dL — ABNORMAL HIGH (ref 6–23)
CO2: 33 meq/L — ABNORMAL HIGH (ref 19–32)
Calcium: 9.7 mg/dL (ref 8.4–10.5)
Chloride: 103 meq/L (ref 96–112)
Creatinine, Ser: 1.29 mg/dL (ref 0.40–1.50)
GFR: 47.76 mL/min — ABNORMAL LOW (ref >60.00)
Glucose, Bld: 119 mg/dL — ABNORMAL HIGH (ref 70–99)
Potassium: 4.5 meq/L (ref 3.5–5.1)
Sodium: 142 meq/L (ref 135–145)

## 2023-06-18 LAB — COMPREHENSIVE METABOLIC PANEL WITH GFR
ALT: 26 U/L (ref 0–53)
AST: 32 U/L (ref 0–37)
Albumin: 3.9 g/dL (ref 3.5–5.2)
Alkaline Phosphatase: 68 U/L (ref 39–117)
BUN: 32 mg/dL — ABNORMAL HIGH (ref 6–23)
CO2: 33 meq/L — ABNORMAL HIGH (ref 19–32)
Calcium: 9.7 mg/dL (ref 8.4–10.5)
Chloride: 103 meq/L (ref 96–112)
Creatinine, Ser: 1.29 mg/dL (ref 0.40–1.50)
GFR: 47.76 mL/min — ABNORMAL LOW (ref >60.00)
Glucose, Bld: 119 mg/dL — ABNORMAL HIGH (ref 70–99)
Potassium: 4.5 meq/L (ref 3.5–5.1)
Sodium: 142 meq/L (ref 135–145)
Total Bilirubin: 0.8 mg/dL (ref 0.2–1.2)
Total Protein: 6.7 g/dL (ref 6.0–8.3)

## 2023-06-18 LAB — BRAIN NATRIURETIC PEPTIDE: Pro B Natriuretic peptide (BNP): 875 pg/mL — ABNORMAL HIGH (ref 0.0–100.0)

## 2023-06-18 LAB — TSH: TSH: 0.82 u[IU]/mL (ref 0.35–5.50)

## 2023-06-18 LAB — MAGNESIUM: Magnesium: 2.1 mg/dL (ref 1.5–2.5)

## 2023-06-18 NOTE — Assessment & Plan Note (Signed)
 Chronic Persistent A-fib Continue Eliquis  5 mg twice daily

## 2023-06-18 NOTE — Assessment & Plan Note (Signed)
 Chronic Lab Results  Component Value Date   HGBA1C 5.6 01/30/2021   Low risk Check a1c Continue low sugar diet

## 2023-06-18 NOTE — Assessment & Plan Note (Signed)
 Has been having intermittent episodes of confusion During the visit he was not always answering questions appropriately and sometimes not making sense Likely has some dementia - never officially diagnosed Referral to neurology  Advised daughter we should treat any anxiety/depression or agitation  if needed

## 2023-06-18 NOTE — Assessment & Plan Note (Signed)
 Chronic Blood pressure well controlled Cmp, cbc Continue hydralazine  25 mg 3 times daily, losartan  100 mg daily

## 2023-06-18 NOTE — Assessment & Plan Note (Signed)
 Chronic Appears to be euvolemic, Chronic leg edema stable Continue Lasix  80 mg daily  Cmp, bnp

## 2023-06-18 NOTE — Assessment & Plan Note (Signed)
Chronic Lifestyle controlled Not currently on any medication

## 2023-06-18 NOTE — Assessment & Plan Note (Signed)
 Chronic RLE chronically larger than LLE Edema stable on current lasix  dose Elevating legs when sitting Continue Lasix  80 mg daily  Cmp, bnp

## 2023-06-19 ENCOUNTER — Ambulatory Visit: Admitting: Internal Medicine

## 2023-06-19 LAB — HEMOGLOBIN A1C: Hgb A1c MFr Bld: 6.6 % — ABNORMAL HIGH (ref 4.6–6.5)

## 2023-06-20 ENCOUNTER — Encounter: Payer: Self-pay | Admitting: Internal Medicine

## 2023-06-20 ENCOUNTER — Encounter: Payer: Self-pay | Admitting: Physician Assistant

## 2023-06-21 ENCOUNTER — Other Ambulatory Visit: Payer: Self-pay | Admitting: Internal Medicine

## 2023-06-27 ENCOUNTER — Ambulatory Visit

## 2023-06-27 VITALS — Ht 65.0 in | Wt 147.0 lb

## 2023-06-27 DIAGNOSIS — Z0000 Encounter for general adult medical examination without abnormal findings: Secondary | ICD-10-CM | POA: Diagnosis not present

## 2023-06-27 NOTE — Progress Notes (Signed)
 Subjective:   Christopher Richards is a 89 y.o. who presents for a Medicare Wellness preventive visit.  Visit Complete: Virtual I connected with  Christopher Richards on 06/27/23 by a audio enabled telemedicine application and verified that I am speaking with the correct person using two identifiers.  Patient Location: Home  Provider Location: Office/Clinic  I discussed the limitations of evaluation and management by telemedicine. The patient expressed understanding and agreed to proceed.  Vital Signs: Because this visit was a virtual/telehealth visit, some criteria may be missing or patient reported. Any vitals not documented were not able to be obtained and vitals that have been documented are patient reported.  VideoDeclined- This patient declined Librarian, academic. Therefore the visit was completed with audio only.  Persons Participating in Visit: Patient assisted by Daughter, [name].  AWV Questionnaire: No: Patient Medicare AWV questionnaire was not completed prior to this visit.  Cardiac Risk Factors include: advanced age (>45men, >83 women);dyslipidemia;hypertension;male gender     Objective:     Today's Vitals   06/27/23 1407  Weight: 147 lb (66.7 kg)  Height: 5\' 5"  (1.651 m)   Body mass index is 24.46 kg/m.     06/27/2023    2:06 PM 07/13/2022    9:18 AM 10/30/2021    7:26 PM 10/30/2021   10:59 AM 07/10/2021    9:41 AM 06/07/2021    8:37 AM 07/05/2020   11:14 AM  Advanced Directives  Does Patient Have a Medical Advance Directive? Yes Yes No Yes Yes Yes Yes  Type of Estate agent of [place];Living will Healthcare Power of [place];Living will  Healthcare Power of [place];Living will Healthcare Power of [place];Living will Living will;Healthcare Power of Attorney Living will;Healthcare Power of Attorney  Does patient want to make changes to medical advance directive?  No - Patient declined     No - Patient declined  Copy of  Healthcare Power of Attorney in Chart? No - copy requested No - copy requested  No - copy requested No - copy requested No - copy requested No - copy requested  Would patient like information on creating a medical advance directive?   No - Patient declined        Current Medications (verified) Outpatient Encounter Medications as of 06/27/2023  Medication Sig   ALOE-SODIUM CHLORIDE  NA Place 1 spray into the nose at bedtime. Saline Nasal Spray With Aloe   Cholecalciferol (VITAMIN D) 2000 UNITS tablet Take 2,000 Units by mouth daily.   CVS MAGNESIUM GLYCINATE PO Take 1,330 mg by mouth daily.   Cyanocobalamin (VITAMIN B-12 PO) Take 1 tablet by mouth daily.   doxycycline (VIBRAMYCIN) 100 MG capsule Take 100 mg by mouth 2 (two) times daily.   ELIQUIS  5 MG TABS tablet TAKE 1 TABLET BY MOUTH TWICE A DAY   finasteride (PROSCAR) 5 MG tablet Take 5 mg by mouth daily.   fluticasone -salmeterol (ADVAIR) 100-50 MCG/ACT AEPB Inhale 1 puff into the lungs 2 (two) times daily.   furosemide  (LASIX ) 20 MG tablet Take one tab daily in afternoon   furosemide  (LASIX ) 40 MG tablet Take 1 tab daily in morning   hydrALAZINE  (APRESOLINE ) 25 MG tablet TAKE 1 TABLET BY MOUTH THREE TIMES A DAY   losartan  (COZAAR ) 100 MG tablet TAKE 1 TABLET BY MOUTH EVERY DAY   Lutein 40 MG CAPS Take by mouth.   Sodium Chloride -Xylitol (XLEAR SINUS CARE SPRAY NA) Place 1-2 sprays into the nose daily as needed (sinus issues.).  tamsulosin  (FLOMAX ) 0.4 MG CAPS capsule Take 0.4 mg by mouth daily.   tobramycin-dexamethasone (TOBRADEX) ophthalmic solution 1 drop 3 (three) times daily.   No facility-administered encounter medications on file as of 06/27/2023.    Allergies (verified) Azithromycin  and Amlodipine    History: Past Medical History:  Diagnosis Date   Arthritis    Basal cell cancer    SKIN.Aaron AasFACE AND HAND   Benign prostatic hypertrophy    Bronchitis    11/2013    Chest pain 02/19/1990   NEGATIVE CATH   Hyperglycemia    PMH  of   Hyperlipidemia    Hypertension    Pneumonia 02/20/2007   treated as OP   Tachycardia    hx of per patient    Past Surgical History:  Procedure Laterality Date   BASAL CELL CARCINOMA EXCISION     X 3   CARDIOVERSION N/A 06/07/2021   Procedure: CARDIOVERSION;  Surgeon: Sheryle Donning, MD;  Location: [facility] ENDOSCOPY;  Service: Cardiovascular;  Laterality: N/A;  0905 shocked @120j  0906 shocked @150j , NSR   CARDIOVERSION N/A 10/30/2021   Procedure: CARDIOVERSION;  Surgeon: Hazle Lites, MD;  Location: [facility] ENDOSCOPY;  Service: Cardiovascular;  Laterality: N/A;   cataract surgery      bilateral   COLONOSCOPY  2003   [facility]   HERNIA REPAIR  1972   Dr  Aldo Amble   INGUINAL HERNIA REPAIR Right 04/06/2014   Procedure: REPAIR RIGHT INGUINAL HERNIA WITH MESH;  Surgeon: Adalberto Hollow, MD;  Location: [facility] ORS;  Service: General;  Laterality: Right;   INSERTION OF MESH Right 04/06/2014   Procedure: INSERTION OF MESH;  Surgeon: Adalberto Hollow, MD;  Location: [facility] ORS;  Service: General;  Laterality: Right;   Family History  Problem Relation Age of Onset   Alcohol abuse Brother    Hypertension Mother    Heart disease Mother        AF   Transient ischemic attack Mother    Heart attack Father 36   Hypertension Father    Lung disease Father        ? Black Lung Oceanographer)   Cancer Neg Hx    Diabetes Neg Hx    Social History   Socioeconomic History   Marital status: Widowed    Spouse name: Not on file   Number of children: 2   Years of education: 49   Highest education level: Master's degree (e.g., MA, MS, MEng, MEd, MSW, MBA)  Occupational History   Occupation: [employer]  Tobacco Use   Smoking status: Never    Passive exposure: Never   Smokeless tobacco: Never   Tobacco comments:    Never smoke 11/14/21  Vaping Use   Vaping status: Never Used  Substance and Sexual Activity   Alcohol use: No   Drug use: No   Sexual activity: Not Currently  Other Topics  Concern   Not on file  Social History Narrative   Denies abuse and feels safe at home.   Social Drivers of Corporate investment banker Strain: Low Risk  (06/27/2023)   Overall Financial Resource Strain (CARDIA)    Difficulty of Paying Living Expenses: Not hard at all  Food Insecurity: No Food Insecurity (06/27/2023)   Hunger Vital Sign    Worried About Running Out of Food in the Last Year: Never true    Ran Out of Food in the Last Year: Never true  Transportation Needs: No Transportation Needs (06/27/2023)   PRAPARE - Transportation  Lack of Transportation (Medical): No    Lack of Transportation (Non-Medical): No  Physical Activity: Insufficiently Active (06/27/2023)   Exercise Vital Sign    Days of Exercise per Week: 1 day    Minutes of Exercise per Session: 10 min  Stress: No Stress Concern Present (06/27/2023)   [employer] of Occupational Health - Occupational Stress Questionnaire    Feeling of Stress : Only a little  Social Connections: Socially Isolated (06/27/2023)   Social Connection and Isolation Panel [NHANES]    Frequency of Communication with Friends and Family: Three times a week    Frequency of Social Gatherings with Friends and Family: Twice a week    Attends Religious Services: Never    Database administrator or Organizations: No    Attends Banker Meetings: Never    Marital Status: Widowed    Tobacco Counseling Counseling given: No Tobacco comments: Never smoke 11/14/21    Clinical Intake:  Pre-visit preparation completed: Yes  Pain : No/denies pain     BMI - recorded: 24.46 Nutritional Risks: None Diabetes: No  Lab Results  Component Value Date   HGBA1C 6.6 (H) 06/18/2023   HGBA1C 5.6 01/30/2021   HGBA1C 5.6 07/29/2020     How often do you need to have someone help you when you read instructions, pamphlets, or other written materials from your doctor or pharmacy?: 1 - Never  Interpreter Needed?: No  Information entered by ::  Kandy Orris, CMA   Activities of Daily Living     06/27/2023    2:09 PM 07/12/2022    4:21 PM  In your present state of health, do you have any difficulty performing the following activities:  Hearing? 0 1  Vision? 0 1  Difficulty concentrating or making decisions? 0 0  Walking or climbing stairs? 0 0  Dressing or bathing? 1 0  Comment Daughters helps   Doing errands, shopping? 1 0  Comment Daughters Press photographer and eating ? Y N  Comment Daughters helps   Using the Toilet? N N  In the past six months, have you accidently leaked urine? Colie Dawes  Comment wears depends   Do you have problems with loss of bowel control? Y N  Comment wears depends   Managing your Medications? Y N  Comment Daughters helps   Managing your Finances? Y N  Comment Daughters helps   Housekeeping or managing your Housekeeping? Anselm Kingfisher  Comment Daughters helps     Patient Care Team: Colene Dauphin, MD as PCP - General (Internal Medicine) Loyde Rule, MD as PCP - Cardiology (Cardiology) Mealor, Donnamae Gaba, MD as PCP - Electrophysiology (Cardiology) [place], Heather, OD as Consulting Physician (Optometry) Kirkland Peppers, Francetta Innocent, MD as Consulting Physician (Ophthalmology)  Indicate any recent Medical Services you may have received from other than [facility] providers in the past year (date may be approximate).     Assessment:    This is a routine wellness examination for Dakhari.  Hearing/Vision screen Hearing Screening - Comments:: Wears hearing aids Vision Screening - Comments:: Wears rx glasses - up to date with routine eye exams with Dr Heather [place]undi   Goals Addressed               This Visit's Progress     Patient Stated (pt-stated)        Patient stated he wants to be able to exercise more due to A.Fib.       Depression Screen  06/27/2023    2:18 PM 07/13/2022    9:03 AM 07/13/2022    8:57 AM 05/29/2022    1:10 PM 08/01/2021   12:55 PM 07/10/2021    9:41 AM 07/10/2021    9:39 AM  PHQ 2/9  Scores  PHQ - 2 Score 1 3 3  0 0 0 0  PHQ- 9 Score 7 6 6  1       Fall Risk     06/27/2023    2:13 PM 04/30/2023    9:56 AM 07/13/2022    9:07 AM 07/12/2022    4:21 PM 05/29/2022    1:10 PM  Fall Risk   Falls in the past year? 1 1 0 0 0  Number falls in past yr: 1 0 0 0 0  Comment 2      Injury with Fall? 1 1 0 0 0  Comment head injury      Risk for fall due to : History of fall(s) No Fall Risks No Fall Risks  No Fall Risks  Follow up Falls evaluation completed;Falls prevention discussed Falls evaluation completed Falls evaluation completed  Falls evaluation completed    MEDICARE RISK AT HOME:  Medicare Risk at Home Any stairs in or around the home?: No If so, are there any without handrails?: No Home free of loose throw rugs in walkways, pet beds, electrical cords, etc?: Yes Adequate lighting in your home to reduce risk of falls?: Yes Life alert?: Yes Use of a cane, walker or w/c?: Yes (cane/walker) Grab bars in the bathroom?: Yes Shower chair or bench in shower?: Yes Elevated toilet seat or a handicapped toilet?: Yes  TIMED UP AND GO:  Was the test performed?  No  Cognitive Function: 6CIT completed    05/22/2016    8:27 AM  MMSE - Mini Mental State Exam  Orientation to time 5  Orientation to Place 5  Registration 3  Attention/ Calculation 5  Recall 2  Language- name 2 objects 2  Language- repeat 1  Language- follow 3 step command 3  Language- read & follow direction 1  Write a sentence 1  Copy design 1  Total score 29        06/27/2023    2:16 PM 07/13/2022    9:14 AM  6CIT Screen  What Year? 0 points 0 points  What month? 0 points 0 points  What time? 0 points 0 points  Count back from 20 0 points 0 points  Months in reverse 0 points 0 points  Repeat phrase 0 points 2 points  Total Score 0 points 2 points    Immunizations Immunization History  Administered Date(s) Administered   Fluad Quad(high Dose 65+) 12/03/2018   Influenza, High Dose Seasonal PF  11/19/2017   Influenza-Unspecified 11/24/2013, 11/17/2015, 11/26/2016, 11/20/2019   PFIZER(Purple Top)SARS-COV-2 Vaccination 03/10/2019, 03/30/2019   Pneumococcal Conjugate-13 06/23/2014   Pneumococcal Polysaccharide-23 03/09/2015   Tdap 11/21/2011    Screening Tests Health Maintenance  Topic Date Due   Zoster Vaccines- Shingrix (1 of 2) Never done   COVID-19 Vaccine (3 - Pfizer risk series) 04/27/2019   DTaP/Tdap/Td (2 - Td or Tdap) 11/20/2021   INFLUENZA VACCINE  09/20/2023   Medicare Annual Wellness (AWV)  06/26/2024   Pneumonia Vaccine 58+ Years old  Completed   HPV VACCINES  Aged Out   Meningococcal B Vaccine  Aged Out    Health Maintenance  Health Maintenance Due  Topic Date Due   Zoster Vaccines- Shingrix (1 of 2) Never  done   COVID-19 Vaccine (3 - Pfizer risk series) 04/27/2019   DTaP/Tdap/Td (2 - Td or Tdap) 11/20/2021   Health Maintenance Items Addressed: 06/27/2023   Additional Screening:  Vision Screening: Recommended annual ophthalmology exams for early detection of glaucoma and other disorders of the eye.  Dental Screening: Recommended annual dental exams for proper oral hygiene  Community Resource Referral / Chronic Care Management: CRR required this visit?  No   CCM required this visit?  No   Plan:    I have personally reviewed and noted the following in the patient's chart:   Medical and social history Use of alcohol, tobacco or illicit drugs  Current medications and supplements including opioid prescriptions. Patient is not currently taking opioid prescriptions. Functional ability and status Nutritional status Physical activity Advanced directives List of other physicians Hospitalizations, surgeries, and ER visits in previous 12 months Vitals Screenings to include cognitive, depression, and falls Referrals and appointments  In addition, I have reviewed and discussed with patient certain preventive protocols, quality metrics, and best  practice recommendations. A written personalized care plan for preventive services as well as general preventive health recommendations were provided to patient.   Patria Bookbinder, CMA   06/27/2023   After Visit Summary: (MyChart) Due to this being a telephonic visit, the after visit summary with patients personalized plan was offered to patient via MyChart   Notes: Nothing significant to report at this time.

## 2023-06-27 NOTE — Patient Instructions (Addendum)
 Mr. Christopher Richards , Thank you for taking time out of your busy schedule to complete your Annual Wellness Visit with me. I enjoyed our conversation and look forward to speaking with you again next year. I, as well as your care team,  appreciate your ongoing commitment to your health goals. Please review the following plan we discussed and let me know if I can assist you in the future. Your Game plan/ To Do List   Follow up Visits: Next Medicare AWV with our clinical staff: 06/19/2024 at 1:40pm   Have you seen your provider in the last 6 months (3 months if uncontrolled diabetes)? Yes  Clinician Recommendations:  Aim for 30 minutes of exercise or brisk walking, 6-8 glasses of water, and 5 servings of fruits and vegetables each day. Educated and advised on getting the Tdap (Tetenus) , Shingles, and COVID vaccines in 2025 at local pharmacy.       This is a list of the screening recommended for you and due dates:  Health Maintenance  Topic Date Due   Zoster (Shingles) Vaccine (1 of 2) Never done   COVID-19 Vaccine (3 - Pfizer risk series) 04/27/2019   DTaP/Tdap/Td vaccine (2 - Td or Tdap) 11/20/2021   Flu Shot  09/20/2023   Medicare Annual Wellness Visit  06/26/2024   Pneumonia Vaccine  Completed   HPV Vaccine  Aged Out   Meningitis B Vaccine  Aged Out    Advanced directives: (Copy Requested) Please bring a copy of your health care power of attorney and living will to the office to be added to your chart at your convenience. You can mail to [facility] [address] [place], [place] [address] or email to [email or web address] Advance Care Planning is important because it:  [x]  Makes sure you receive the medical care that is consistent with your values, goals, and preferences  [x]  It provides guidance to your family and loved ones and reduces their decisional burden about whether or not they are making the right decisions based on your wishes.  Follow the link provided in your  after visit summary or read over the paperwork we have mailed to you to help you started getting your Advance Directives in place. If you need assistance in completing these, please reach out to us  so that we can help you!

## 2023-07-16 ENCOUNTER — Other Ambulatory Visit: Payer: Self-pay | Admitting: Internal Medicine

## 2023-07-19 ENCOUNTER — Ambulatory Visit

## 2023-07-19 ENCOUNTER — Ambulatory Visit: Admitting: Physician Assistant

## 2023-07-19 ENCOUNTER — Encounter: Payer: Self-pay | Admitting: Physician Assistant

## 2023-07-19 VITALS — BP 132/59 | HR 62 | Resp 20 | Ht 65.0 in | Wt 145.0 lb

## 2023-07-19 DIAGNOSIS — R413 Other amnesia: Secondary | ICD-10-CM | POA: Insufficient documentation

## 2023-07-19 NOTE — Progress Notes (Signed)
 Assessment/Plan:     Christopher Richards is a very pleasant 89 y.o. year old LH male with a history of hypertension, hyperlipidemia, BPH, chronic ET hands, prediabetes, CHF, Afib on Eliquis , seen today for evaluation of memory loss.  MMSE today was 25/30.  Etiology is unclear and workup is in progress.  Given his advanced age, and multiple cardiac medical problems, prior to initiating any therapy, will order an MRI of the brain to further visualize any underlying structural abnormality and assess the vascular load.  He needs some assistance with his ADLs, no longer drives.  Mood being addressed, as he may be experiencing situational depression after the death of his wife despite great family support.    Dementia with behavioral disturbance, concern for vascular   MRI brain without contrast to assess for underlying structural abnormality and assess vascular load  Will consider starting antidementia medication pending on the MRI of the brain results   Check B12  Recommend good control of cardiovascular risk factors.   Continue to control mood as per PCP Consider adult day program for cognitive and social stimulation. Folllow up in 4 months  Subjective:    The patient is accompanied by his daughter  who supplements the history.    How long did patient have memory difficulties?  For the last 8 months, after his MI.  Short-term memory is affected, including  difficulty remembering new information, recent conversations and names of people.  LTM may be affected to a certain extent. repeats oneself?  Endorsed Disoriented when walking into a room?  Denies except occasionally not remembering what patient came to the room for .   Leaving objects in unusual places?  He may misplace things, not in unusual places   Wandering behavior? Denies.   Any personality changes, or depression, anxiety?  He is dealing with the death of his wife of 39 years, March 2024. Hallucinations or paranoia?   Daughter reports  that he thinks he has software implanted on him and argues with his daughter and that "it is unlike him".  Seizures? Denies.    Any sleep changes? Does not sleep well. Denies vivid dreams, REM behavior or sleepwalking.   Sleep apnea? Denies.   Any hygiene concerns?  Needs to be reminded.  Independent of bathing and dressing?  Needs assistance getting dressed.  Who is in charge of the medications? Daughter is in charge   Who is in charge of the finances?  Daughter is in charge     Any changes in appetite?   Decreased "he won't eat if not in front of him". Does not drink enough water .    Patient have trouble swallowing?  Denies.   Does the patient cook?  No.   Any headaches?  Denies.   Any vision changes?  Denies.  Patient has a history of cataracts removed 10 years ago Chronic back pain?  He reports "Some pain in my back".  Ambulates with difficulty? Needs a walker to ambulate     Recent falls or head injuries?  He had a mechanical a fall in January 2025  when he was walking to the mailbox without head injury, no loss of consciousness Stroke like symptoms?  Denies.   Any tremors? He has  Any anosmia?  Denies.   Any incontinence of urine? Endorsed, wears diapers. No recent  UTI. Any bowel dysfunction?Has chronic constipation     Daughter and her husband moved in with him  History of heavy alcohol intake?  Denies.   History of heavy tobacco use? Denies.   Family history of dementia?   Denies. Does patient drive? No longer drives Retired Warehouse manager of a Lobbyist at [employer], H&H 11.9-36.9, lipid panel normal   Allergies  Allergen Reactions   Azithromycin  Other (See Comments)    Burning in tongue   Amlodipine  Other (See Comments)    Ankle edema    Current Outpatient Medications  Medication Instructions   ALOE-SODIUM CHLORIDE  NA 1 spray, Daily at bedtime   [facility] MAGNESIUM GLYCINATE PO 1,330 mg, Daily   Cyanocobalamin (VITAMIN B-12 PO) 1 tablet,  Daily   doxycycline (VIBRAMYCIN) 100 mg, 2 times daily   Eliquis  5 mg, Oral, 2 times daily   finasteride (PROSCAR) 5 mg, Daily   fluticasone -salmeterol (ADVAIR) 100-50 MCG/ACT AEPB 1 puff, Inhalation, 2 times daily   furosemide  (LASIX ) 20 MG tablet ALTERNATE BETWEEN TAKING 2 TABLETS AND 3 TABLETS BY MOUTH DAILY   furosemide  (LASIX ) 40 MG tablet Take 1 tab daily in morning   hydrALAZINE  (APRESOLINE ) 25 MG tablet TAKE 1 TABLET BY MOUTH THREE TIMES A DAY   losartan  (COZAAR ) 100 mg, Oral, Daily   Lutein 40 MG CAPS Take by mouth.   Sodium Chloride -Xylitol (XLEAR SINUS CARE SPRAY NA) 1-2 sprays, Daily PRN   tamsulosin  (FLOMAX ) 0.4 mg, Daily   tobramycin-dexamethasone (TOBRADEX) ophthalmic solution 1 drop, 3 times daily   Vitamin D 2,000 Units, Daily     VITALS:   Vitals:   07/19/23 0753  BP: (!) 132/59  Pulse: 62  Resp: 20  SpO2: 99%  Weight: 145 lb (65.8 kg)  Height: 5\' 5"  (1.651 m)      PHYSICAL EXAM   HEENT:  Normocephalic, atraumatic. The mucous membranes are moist. The superficial temporal arteries are without ropiness or tenderness. Cardiovascular: Regular rate and rhythm. Lungs: Clear to auscultation bilaterally. Neck: There are no carotid bruits noted bilaterally.  NEUROLOGICAL:     No data to display             07/19/2023    2:00 PM 05/22/2016    8:27 AM  MMSE - Mini Mental State Exam  Orientation to time 2 5  Orientation to Place 3 5  Registration 3 3  Attention/ Calculation 5 5  Recall 3 2  Language- name 2 objects 2 2  Language- repeat 1 1  Language- follow 3 step command 3 3  Language- read & follow direction 1 1  Write a sentence 1 1  Copy design 1 1  Total score 25 29     Orientation:  Alert and oriented to person, not to place and time . No aphasia or dysarthria. Fund of knowledge is appropriate. Recent memory impaired, remote memory fair.  Attention and concentration are normal.  Able to name objects and unable to repeat phrases. Delayed recall  3/3 Cranial nerves: There is good facial symmetry. Extraocular muscles are intact and visual fields are full to confrontational testing. Speech is fluent and clear, no tongue deviation. Hearing is decreased to conversational tone.  Tone: Tone is good throughout.  No cogwheeling Abnormal movements.  Mild intention tremor left greater than right, no asterixis, no fasciculations Sensation: Sensation is intact to light touch and pinprick throughout. Vibration is intact at the bilateral big toe. Coordination: The patient has no difficulty with RAM's or FNF bilaterally. Normal finger to nose  Motor: Strength is 5/5 in the bilateral upper and lower extremities. There is no pronator  drift.   DTR's: Deep tendon reflexes are 2/4 .  Plantar responses are downgoing bilaterally. Gait and Station: The patient is able to ambulate with some difficulty.  Flexes forward.  Needs a walker to ambulate  . Gait is cautious and narrow.  Stride is short.   Thank you for allowing us  the opportunity to participate in the care of this nice patient. Please do not hesitate to contact us  for any questions or concerns.   Total time spent on today's visit was 60 minutes dedicated to this patient today, preparing to see patient, examining the patient, ordering tests and/or medications and counseling the patient, documenting clinical information in the EHR or other health record, independently interpreting results and communicating results to the patient/family, discussing treatment and goals, answering patient's questions and coordinating care.  Cc:  Colene Dauphin, MD  Tex Filbert 07/19/2023 2:07 PM

## 2023-07-19 NOTE — Patient Instructions (Addendum)
 It was a pleasure to see you today at our office.   Recommendations:  MRI of the brain, the radiology office will call you to arrange you appointment    Check labs today  B12 Follow up in 4 months  Recommend visiting the website : " [facility]" to better understand some behaviors related to memory loss.  For psychiatric meds, mood meds: Please have your primary care physician manage these medications.   If you have any severe symptoms of a stroke, or other severe issues such as confusion,severe chills or fever, etc call 911 or go to the ER as you may need to be evaluated further  For guidance regarding [facility] and if placement were needed at the facility, contact Social Worker tel: [phone]   For assessment of decision of mental capacity and competency:  Call Dr. Laverne Potter, geriatric psychiatrist at [phone] Counseling regarding caregiver distress, including caregiver depression, anxiety and issues regarding community resources, adult day care programs, adult living facilities, or memory care questions:  please contact your  Primary Doctor's Social Worker   FOR Memory  decline, memory medications: Call our office [phone]    [email or web address]   RECOMMENDATIONS FOR ALL PATIENTS WITH MEMORY PROBLEMS: 1. Continue to exercise (Recommend 30 minutes of walking everyday, or 3 hours every week) 2. Increase social interactions - continue going to [place] and enjoy social gatherings with friends and family 3. Eat healthy, avoid fried foods and eat more fruits and vegetables 4. Maintain adequate blood pressure, blood sugar, and blood cholesterol level. Reducing the risk of stroke and cardiovascular disease also helps promoting better memory. 5. Avoid stressful situations. Live a simple life and avoid aggravations. Organize your time and prepare for the next day in anticipation. 6. Sleep well,  avoid any interruptions of sleep and avoid any distractions in the bedroom that may interfere with adequate sleep quality 7. Avoid sugar, avoid sweets as there is a strong link between excessive sugar intake, diabetes, and cognitive impairment We discussed the Mediterranean diet, which has been shown to help patients reduce the risk of progressive memory disorders and reduces cardiovascular risk. This includes eating fish, eat fruits and green leafy vegetables, nuts like almonds and hazelnuts, walnuts, and also use olive oil. Avoid fast foods and fried foods as much as possible. Avoid sweets and sugar as sugar use has been linked to worsening of memory function.  There is always a concern of gradual progression of memory problems. If this is the case, then we may need to adjust level of care according to patient needs. Support, both to the patient and caregiver, should then be put into place.      You have been referred for a neuropsychological evaluation (i.e., evaluation of memory and thinking abilities). Please bring someone with you to this appointment if possible, as it is helpful for the doctor to hear from both you and another adult who knows you well. Please bring eyeglasses and hearing aids if you wear them.    The evaluation will take approximately 3 hours and has two parts:   The first part is a clinical interview with the neuropsychologist (Dr. Kitty Perkins or Dr. Donavon Fudge). During the interview, the neuropsychologist will speak with you and the individual you brought to the appointment.    The second part of the evaluation is testing with the doctor's technician Bernabe Brew or Burdette Carolin). During the testing, the technician will ask you to remember different types of material, solve problems, and answer  some questionnaires. Your family member will not be present for this portion of the evaluation.   Please note: We must reserve several hours of the neuropsychologist's time and the psychometrician's time for  your evaluation appointment. As such, there is a No-Show fee of $100. If you are unable to attend any of your appointments, please contact our office as soon as possible to reschedule.        FALL PRECAUTIONS: Be cautious when walking. Scan the area for obstacles that may increase the risk of trips and falls. When getting up in the mornings, sit up at the edge of the bed for a few minutes before getting out of bed. Consider elevating the bed at the head end to avoid drop of blood pressure when getting up. Walk always in a well-lit room (use night lights in the walls). Avoid area rugs or power cords from appliances in the middle of the walkways. Use a walker or a cane if necessary and consider physical therapy for balance exercise. Get your eyesight checked regularly.  FINANCIAL OVERSIGHT: Supervision, especially oversight when making financial decisions or transactions is also recommended.  HOME SAFETY: Consider the safety of the kitchen when operating appliances like stoves, microwave oven, and blender. Consider having supervision and share cooking responsibilities until no longer able to participate in those. Accidents with firearms and other hazards in the house should be identified and addressed as well.   ABILITY TO BE LEFT ALONE: If patient is unable to contact 911 operator, consider using LifeLine, or when the need is there, arrange for someone to stay with patients. Smoking is a fire hazard, consider supervision or cessation. Risk of wandering should be assessed by caregiver and if detected at any point, supervision and safe proof recommendations should be instituted.  MEDICATION SUPERVISION: Inability to self-administer medication needs to be constantly addressed. Implement a mechanism to ensure safe administration of the medications.      Mediterranean Diet A Mediterranean diet refers to food and lifestyle choices that are based on the traditions of countries located on the  [employer]. This way of eating has been shown to help prevent certain conditions and improve outcomes for people who have chronic diseases, like kidney disease and heart disease. What are tips for following this plan? Lifestyle  Cook and eat meals together with your family, when possible. Drink enough fluid to keep your urine clear or pale yellow. Be physically active every day. This includes: Aerobic exercise like running or swimming. Leisure activities like gardening, walking, or housework. Get 7-8 hours of sleep each night. If recommended by your health care provider, drink red wine in moderation. This means 1 glass a day for nonpregnant women and 2 glasses a day for men. A glass of wine equals 5 oz (150 mL). Reading food labels  Check the serving size of packaged foods. For foods such as rice and pasta, the serving size refers to the amount of cooked product, not dry. Check the total fat in packaged foods. Avoid foods that have saturated fat or trans fats. Check the ingredients list for added sugars, such as corn syrup. Shopping  At the grocery store, buy most of your food from the areas near the walls of the store. This includes: Fresh fruits and vegetables (produce). Grains, beans, nuts, and seeds. Some of these may be available in unpackaged forms or large amounts (in bulk). Fresh seafood. Poultry and eggs. Low-fat dairy products. Buy whole ingredients instead of prepackaged foods. Buy fresh fruits and  vegetables in-season from local farmers markets. Buy frozen fruits and vegetables in resealable bags. If you do not have access to quality fresh seafood, buy precooked frozen shrimp or canned fish, such as tuna, salmon, or sardines. Buy small amounts of raw or cooked vegetables, salads, or olives from the deli or salad bar at your store. Stock your pantry so you always have certain foods on hand, such as olive oil, canned tuna, canned tomatoes, rice, pasta, and beans. Cooking   Cook foods with extra-virgin olive oil instead of using butter or other vegetable oils. Have meat as a side dish, and have vegetables or grains as your main dish. This means having meat in small portions or adding small amounts of meat to foods like pasta or stew. Use beans or vegetables instead of meat in common dishes like chili or lasagna. Experiment with different cooking methods. Try roasting or broiling vegetables instead of steaming or sauteing them. Add frozen vegetables to soups, stews, pasta, or rice. Add nuts or seeds for added healthy fat at each meal. You can add these to yogurt, salads, or vegetable dishes. Marinate fish or vegetables using olive oil, lemon juice, garlic, and fresh herbs. Meal planning  Plan to eat 1 vegetarian meal one day each week. Try to work up to 2 vegetarian meals, if possible. Eat seafood 2 or more times a week. Have healthy snacks readily available, such as: Vegetable sticks with hummus. Greek yogurt. Fruit and nut trail mix. Eat balanced meals throughout the week. This includes: Fruit: 2-3 servings a day Vegetables: 4-5 servings a day Low-fat dairy: 2 servings a day Fish, poultry, or lean meat: 1 serving a day Beans and legumes: 2 or more servings a week Nuts and seeds: 1-2 servings a day Whole grains: 6-8 servings a day Extra-virgin olive oil: 3-4 servings a day Limit red meat and sweets to only a few servings a month What are my food choices? Mediterranean diet Recommended Grains: Whole-grain pasta. Brown rice. Bulgar wheat. Polenta. Couscous. Whole-wheat bread. Dwyane Glad. Vegetables: Artichokes. Beets. Broccoli. Cabbage. Carrots. Eggplant. Green beans. Chard. Kale. Spinach. Onions. Leeks. Peas. Squash. Tomatoes. Peppers. Radishes. Fruits: Apples. Apricots. Avocado. Berries. Bananas. Cherries. Dates. Figs. Grapes. Lemons. Melon. Oranges. Peaches. Plums. Pomegranate. Meats and other protein foods: Beans. Almonds. Sunflower seeds. Pine  nuts. Peanuts. Cod. Salmon. Scallops. Shrimp. Tuna. Tilapia. Clams. Oysters. Eggs. Dairy: Low-fat milk. Cheese. Greek yogurt. Beverages: Water. Red wine. Herbal tea. Fats and oils: Extra virgin olive oil. Avocado oil. Grape seed oil. Sweets and desserts: [place] yogurt with honey. Baked apples. Poached pears. Trail mix. Seasoning and other foods: Basil. Cilantro. Coriander. Cumin. Mint. Parsley. Sage. Rosemary. Tarragon. Garlic. Oregano. Thyme. Pepper. Balsalmic vinegar. Tahini. Hummus. Tomato sauce. Olives. Mushrooms. Limit these Grains: Prepackaged pasta or rice dishes. Prepackaged cereal with added sugar. Vegetables: Deep fried potatoes (french fries). Fruits: Fruit canned in syrup. Meats and other protein foods: Beef. Pork. Lamb. Poultry with skin. Hot dogs. Helene Loader. Dairy: Ice cream. Sour cream. Whole milk. Beverages: Juice. Sugar-sweetened soft drinks. Beer. Liquor and spirits. Fats and oils: Butter. Canola oil. Vegetable oil. Beef fat (tallow). Lard. Sweets and desserts: Cookies. Cakes. Pies. Candy. Seasoning and other foods: Mayonnaise. Premade sauces and marinades. The items listed may not be a complete list. Talk with your dietitian about what dietary choices are right for you. Summary The Mediterranean diet includes both food and lifestyle choices. Eat a variety of fresh fruits and vegetables, beans, nuts, seeds, and whole grains. Limit the amount of red meat  and sweets that you eat. Talk with your health care provider about whether it is safe for you to drink red wine in moderation. This means 1 glass a day for nonpregnant women and 2 glasses a day for men. A glass of wine equals 5 oz (150 mL). This information is not intended to replace advice given to you by your health care provider. Make sure you discuss any questions you have with your health care provider. Document Released: 09/29/2015 Document Revised: 11/01/2015 Document Reviewed: 09/29/2015 Elsevier Interactive Patient  Education  2017 [employer].

## 2023-07-20 ENCOUNTER — Ambulatory Visit: Payer: Self-pay | Admitting: Physician Assistant

## 2023-07-20 LAB — VITAMIN B12: Vitamin B-12: 1184 pg/mL — ABNORMAL HIGH (ref 200–1100)

## 2023-07-23 ENCOUNTER — Encounter: Payer: Self-pay | Admitting: Internal Medicine

## 2023-07-25 MED ORDER — QUETIAPINE FUMARATE 25 MG PO TABS: 25.0000 mg | ORAL_TABLET | Freq: Every day | ORAL | 5 refills | Status: DC

## 2023-07-25 NOTE — Addendum Note (Signed)
 Addended by: Colene Dauphin on: 07/25/2023 02:58 PM   Modules accepted: Orders

## 2023-08-05 ENCOUNTER — Encounter: Payer: Self-pay | Admitting: Occupational Medicine

## 2023-08-05 ENCOUNTER — Ambulatory Visit
Admission: RE | Admit: 2023-08-05 | Discharge: 2023-08-05 | Disposition: A | Discharge status: Home / Self Care | Source: Ambulatory Visit | Attending: Physician Assistant | Admitting: Physician Assistant

## 2023-08-05 DIAGNOSIS — R413 Other amnesia: Secondary | ICD-10-CM | POA: Diagnosis not present

## 2023-08-16 ENCOUNTER — Other Ambulatory Visit: Payer: Self-pay | Admitting: Internal Medicine

## 2023-08-18 IMAGING — DX DG CHEST 2V
2 series · 2 of 2 positions shown · non-contrast
Comparison: 06/11/2011

CLINICAL DATA: Cough, dyspnea on exertion, bilateral leg swelling

EXAM:
CHEST - 2 VIEW

[chest pa]
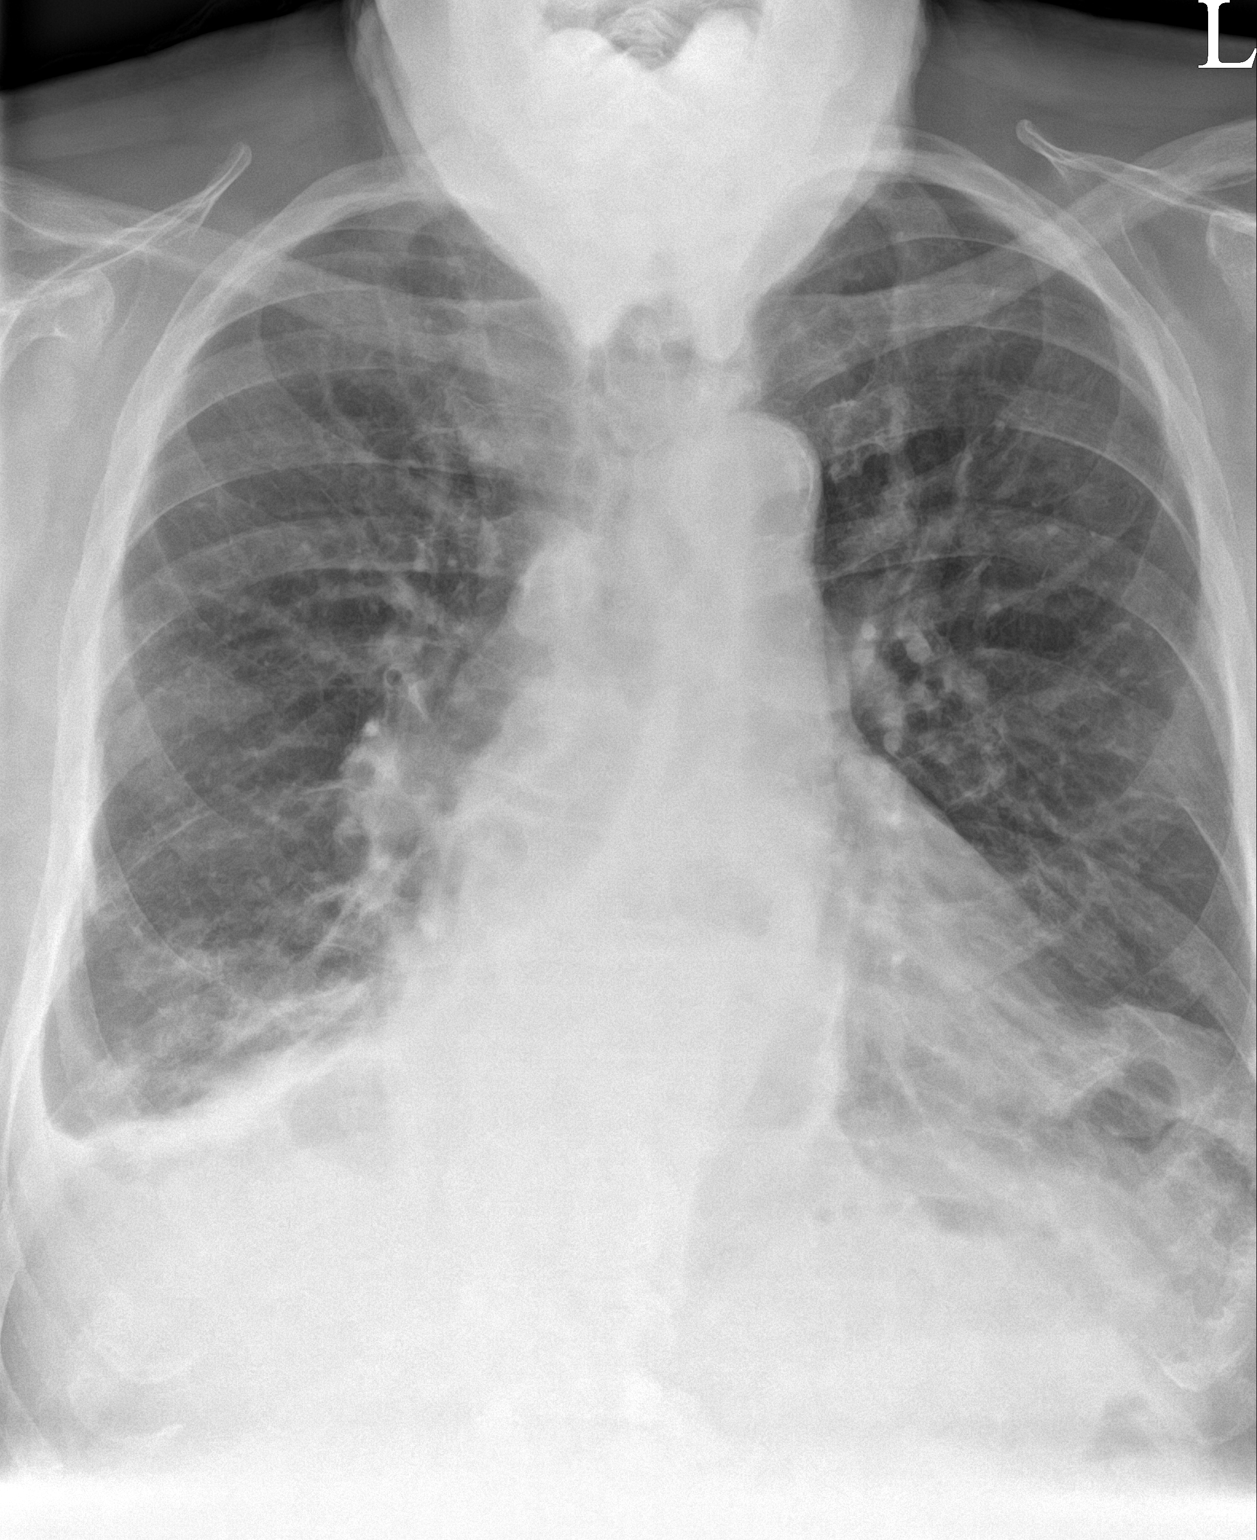

[chest lat]
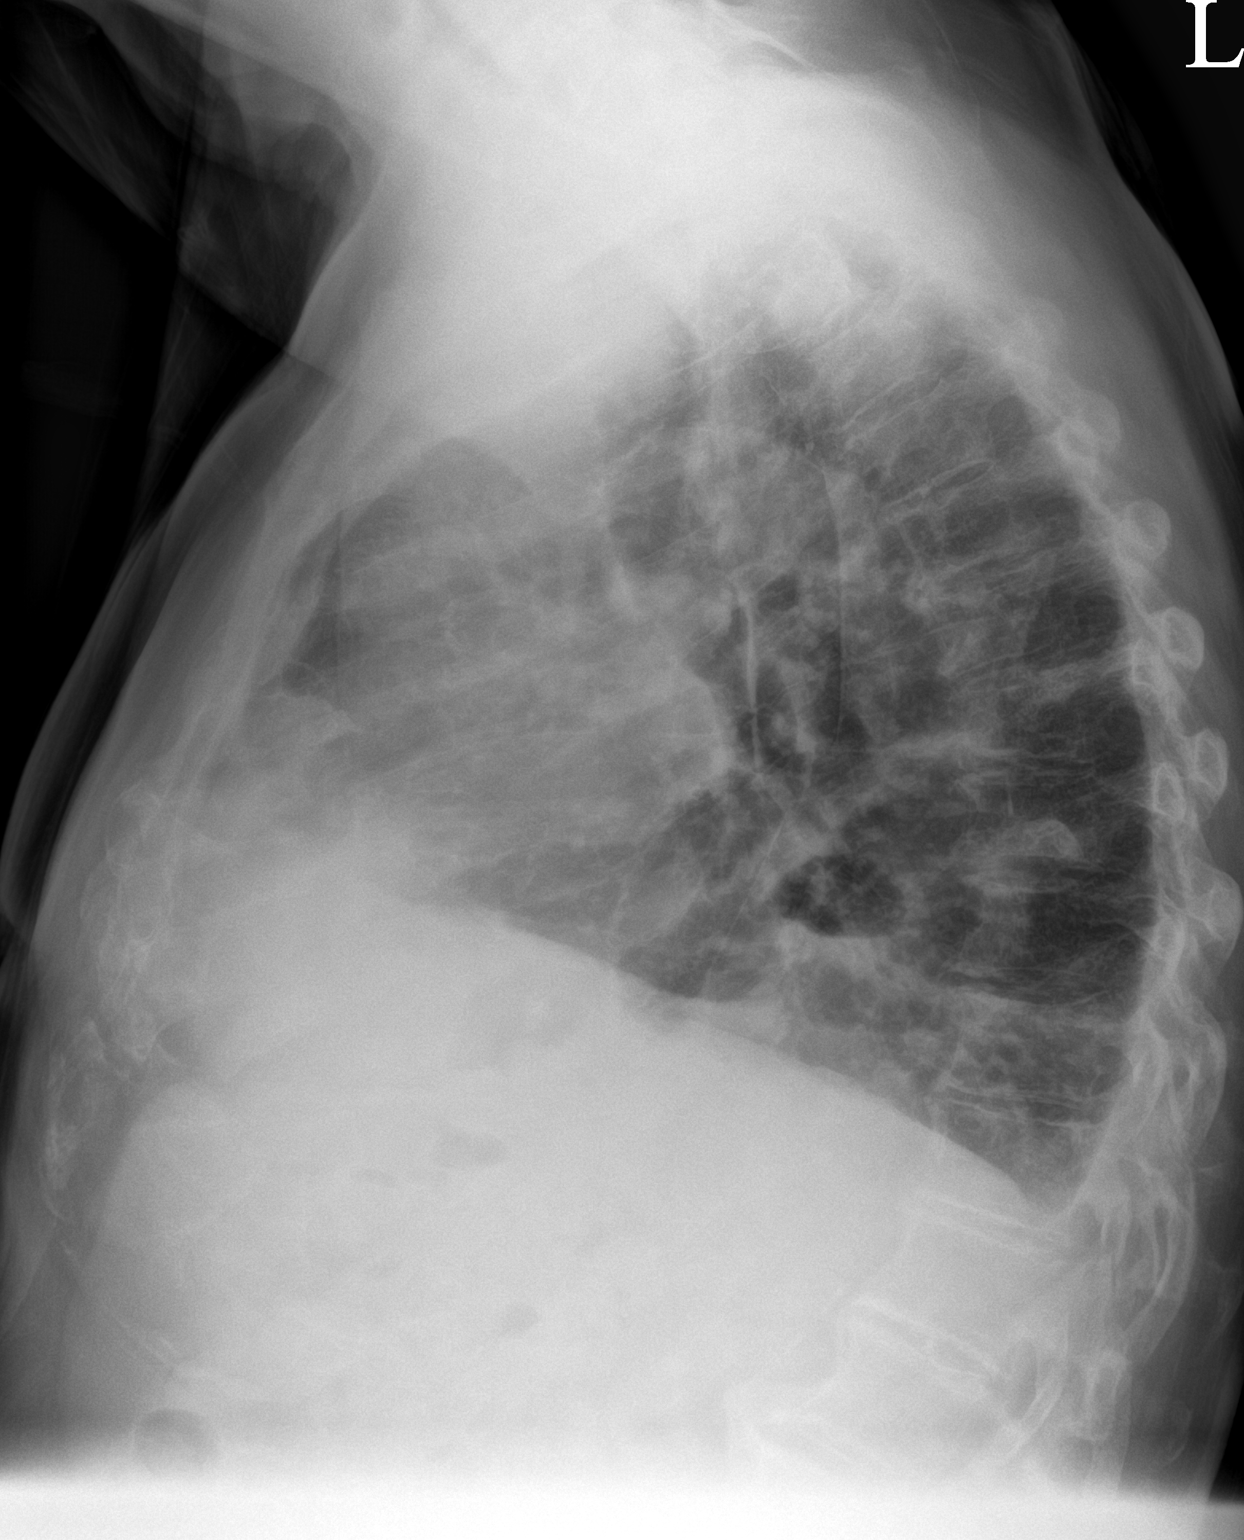

[2 of 2 positions shown; findings below may reference images not displayed]

FINDINGS: Cardiomegaly. Diffuse bilateral interstitial opacity and small
bilateral pleural effusions. Disc degenerative disease of the
thoracic spine.
IMPRESSION: Cardiomegaly with diffuse bilateral interstitial pulmonary opacity
and small bilateral pleural effusions, likely edema. No focal
airspace opacity.

## 2023-08-20 ENCOUNTER — Other Ambulatory Visit: Payer: Self-pay

## 2023-08-20 ENCOUNTER — Encounter: Payer: Self-pay | Admitting: Internal Medicine

## 2023-08-20 DIAGNOSIS — R410 Disorientation, unspecified: Secondary | ICD-10-CM

## 2023-08-20 DIAGNOSIS — R300 Dysuria: Secondary | ICD-10-CM

## 2023-08-21 ENCOUNTER — Ambulatory Visit: Admitting: Internal Medicine

## 2023-08-21 ENCOUNTER — Other Ambulatory Visit

## 2023-08-21 ENCOUNTER — Ambulatory Visit: Payer: Self-pay | Admitting: Internal Medicine

## 2023-08-21 ENCOUNTER — Encounter: Payer: Self-pay | Admitting: Internal Medicine

## 2023-08-21 VITALS — BP 126/78 | HR 53 | Temp 97.8°F | Ht 65.0 in | Wt 145.0 lb

## 2023-08-21 DIAGNOSIS — R350 Frequency of micturition: Secondary | ICD-10-CM

## 2023-08-21 DIAGNOSIS — F32A Depression, unspecified: Secondary | ICD-10-CM | POA: Diagnosis not present

## 2023-08-21 DIAGNOSIS — R7303 Prediabetes: Secondary | ICD-10-CM | POA: Diagnosis not present

## 2023-08-21 DIAGNOSIS — L89321 Pressure ulcer of left buttock, stage 1: Secondary | ICD-10-CM | POA: Diagnosis not present

## 2023-08-21 DIAGNOSIS — R32 Unspecified urinary incontinence: Secondary | ICD-10-CM

## 2023-08-21 DIAGNOSIS — I5032 Chronic diastolic (congestive) heart failure: Secondary | ICD-10-CM | POA: Diagnosis not present

## 2023-08-21 DIAGNOSIS — G4700 Insomnia, unspecified: Secondary | ICD-10-CM | POA: Diagnosis not present

## 2023-08-21 LAB — URINALYSIS, ROUTINE W REFLEX MICROSCOPIC
Bilirubin Urine: NEGATIVE
Hgb urine dipstick: NEGATIVE
Ketones, ur: NEGATIVE
Leukocytes,Ua: NEGATIVE
Nitrite: NEGATIVE
Specific Gravity, Urine: 1.015 (ref 1.000–1.030)
Total Protein, Urine: NEGATIVE
Urine Glucose: NEGATIVE
Urobilinogen, UA: 0.2 (ref 0.0–1.0)
pH: 6 (ref 5.0–8.0)

## 2023-08-21 LAB — BASIC METABOLIC PANEL WITH GFR
BUN: 30 mg/dL — ABNORMAL HIGH (ref 6–23)
CO2: 27 meq/L (ref 19–32)
Calcium: 9.8 mg/dL (ref 8.4–10.5)
Chloride: 105 meq/L (ref 96–112)
Creatinine, Ser: 1.32 mg/dL (ref 0.40–1.50)
GFR: 46.41 mL/min — ABNORMAL LOW (ref >60.00)
Glucose, Bld: 103 mg/dL — ABNORMAL HIGH (ref 70–99)
Potassium: 3.9 meq/L (ref 3.5–5.1)
Sodium: 139 meq/L (ref 135–145)

## 2023-08-21 LAB — HEPATIC FUNCTION PANEL
ALT: 26 U/L (ref 0–53)
AST: 29 U/L (ref 0–37)
Albumin: 3.9 g/dL (ref 3.5–5.2)
Alkaline Phosphatase: 73 U/L (ref 39–117)
Bilirubin, Direct: 0.2 mg/dL (ref 0.0–0.3)
Total Bilirubin: 0.8 mg/dL (ref 0.2–1.2)
Total Protein: 7 g/dL (ref 6.0–8.3)

## 2023-08-21 LAB — CBC WITH DIFFERENTIAL/PLATELET
Basophils Absolute: 0.1 10*3/uL (ref 0.0–0.1)
Basophils Relative: 0.6 % (ref 0.0–3.0)
Eosinophils Absolute: 0.8 10*3/uL — ABNORMAL HIGH (ref 0.0–0.7)
Eosinophils Relative: 9.1 % — ABNORMAL HIGH (ref 0.0–5.0)
HCT: 40.1 % (ref 39.0–52.0)
Hemoglobin: 13 g/dL (ref 13.0–17.0)
Lymphocytes Relative: 17.4 % (ref 12.0–46.0)
Lymphs Abs: 1.6 10*3/uL (ref 0.7–4.0)
MCHC: 32.4 g/dL (ref 30.0–36.0)
MCV: 83.8 fl (ref 78.0–100.0)
Monocytes Absolute: 0.8 10*3/uL (ref 0.1–1.0)
Monocytes Relative: 8.9 % (ref 3.0–12.0)
Neutro Abs: 5.8 10*3/uL (ref 1.4–7.7)
Neutrophils Relative %: 64 % (ref 43.0–77.0)
Platelets: 297 10*3/uL (ref 150.0–400.0)
RBC: 4.79 Mil/uL (ref 4.22–5.81)
RDW: 18.1 % — ABNORMAL HIGH (ref 11.5–15.5)
WBC: 9.1 10*3/uL (ref 4.0–10.5)

## 2023-08-21 LAB — HEMOGLOBIN A1C: Hgb A1c MFr Bld: 6.4 % (ref 4.6–6.5)

## 2023-08-21 LAB — BRAIN NATRIURETIC PEPTIDE: Pro B Natriuretic peptide (BNP): 556 pg/mL — ABNORMAL HIGH (ref 0.0–100.0)

## 2023-08-21 LAB — TSH: TSH: 1.12 u[IU]/mL (ref 0.35–5.50)

## 2023-08-21 MED ORDER — DUODERM CGF DRESSING EX MISC: 1.0000 | CUTANEOUS | 2 refills | Status: AC | PRN

## 2023-08-21 MED ORDER — QUETIAPINE FUMARATE 50 MG PO TABS: 50.0000 mg | ORAL_TABLET | Freq: Every day | ORAL | 1 refills | Status: AC

## 2023-08-21 MED ORDER — CITALOPRAM HYDROBROMIDE 10 MG PO TABS: 10.0000 mg | ORAL_TABLET | Freq: Every day | ORAL | 3 refills | Status: AC

## 2023-08-21 NOTE — Assessment & Plan Note (Signed)
 Mild to mod, for citalopram 10 mg every day, to f/u any worsening symptoms or concerns

## 2023-08-21 NOTE — Patient Instructions (Signed)
 Ok to increase the seroquel  at bedtime to 50 mg  Please take all new medication as prescribed- the citalopram 10 mg per dayt  Please continue all other medications as before, and refills have been done if requested.  Please have the pharmacy call with any other refills you may need  Please keep your appointments with your specialists as you may have planned - cardiology  You will be contacted regarding the referral for: Home Health with RN, PT and wound nurse  Please go to the LAB at the blood drawing area for the tests to be done  You will be contacted by phone if any changes need to be made immediately.  Otherwise, you will receive a letter about your results with an explanation, but please check with MyChart first.  Please make an Appointment to return in 1 months, or sooner if needed, to Dr Geofm

## 2023-08-21 NOTE — Assessment & Plan Note (Signed)
 Lab Results  Component Value Date   HGBA1C 6.6 (H) 06/18/2023   Stable, pt to continue current medical treatment  - diet, wt control

## 2023-08-21 NOTE — Assessment & Plan Note (Signed)
 Mild early shallow, for duoderm, also HH with wound care (and RN and PT),  to f/u any worsening symptoms or concerns

## 2023-08-21 NOTE — Assessment & Plan Note (Signed)
 With mild worsening, for increased seroquel  to 50 mg at bedtime prn

## 2023-08-21 NOTE — Assessment & Plan Note (Signed)
 Also for UA today

## 2023-08-21 NOTE — Progress Notes (Signed)
 Patient ID: Christopher Richards, male   DOB: January 01, 1928, 89 y.o.   MRN: [id number]        Chief Complaint: follow up insomnia, depression grief, left buttock decubitus, chf       HPI:  Christopher Richards is a 89 y.o. male here with daughter, overall doing ok it seems but has had persistent insomnia and night time wandering despite the seroquel  25 mg at bedtime.  Tends to sleep more during the day   Has a favorite chair and apparently sitting more as he has recent developed early small superficial left buttock decubitus x 3 wks.  Has been slowing overall recently, may benefit from PT.  Has also had mild worsening depressive symptoms in the setting of persistent grief with wife passing just over a year ago  But no suicidal ideation, or panic.  Walks with walker at home, Daughter noted recent urinary incontinence and frequency, ongoing mild confusion not really worsening, but brought urine sample today.  Pt denies chest pain, increased sob or doe, wheezing, orthopnea, PND, increased LE swelling, palpitations, or syncope.    Wt Readings from Last 3 Encounters:  08/21/23 145 lb (65.8 kg)  07/19/23 145 lb (65.8 kg)  06/27/23 147 lb (66.7 kg)   BP Readings from Last 3 Encounters:  08/21/23 126/78  07/19/23 (!) 132/59  06/18/23 118/80         Past Medical History:  Diagnosis Date   Arthritis    Basal cell cancer    SKIN.SABRAFACE AND HAND   Benign prostatic hypertrophy    Bronchitis    11/2013    Chest pain 02/19/1990   NEGATIVE CATH   Hyperglycemia    PMH of   Hyperlipidemia    Hypertension    Pneumonia 02/20/2007   treated as OP   Tachycardia    hx of per patient    Past Surgical History:  Procedure Laterality Date   BASAL CELL CARCINOMA EXCISION     X 3   CARDIOVERSION N/A 06/07/2021   Procedure: CARDIOVERSION;  Surgeon: Lonni Slain, MD;  Location: [facility] ENDOSCOPY;  Service: Cardiovascular;  Laterality: N/A;  0905 shocked @120j  0906 shocked @150j , NSR   CARDIOVERSION N/A 10/30/2021    Procedure: CARDIOVERSION;  Surgeon: Mona Vinie BROCKS, MD;  Location: [facility] ENDOSCOPY;  Service: Cardiovascular;  Laterality: N/A;   cataract surgery      bilateral   COLONOSCOPY  2003   [facility]   HERNIA REPAIR  1972   Dr  Maude Salt   INGUINAL HERNIA REPAIR Right 04/06/2014   Procedure: REPAIR RIGHT INGUINAL HERNIA WITH MESH;  Surgeon: Krystal Russell, MD;  Location: [facility] ORS;  Service: General;  Laterality: Right;   INSERTION OF MESH Right 04/06/2014   Procedure: INSERTION OF MESH;  Surgeon: Krystal Russell, MD;  Location: [facility] ORS;  Service: General;  Laterality: Right;    reports that he has never smoked. He has never been exposed to tobacco smoke. He has never used smokeless tobacco. He reports that he does not drink alcohol and does not use drugs. family history includes Alcohol abuse in his brother; Heart attack (age of onset: 16) in his father; Heart disease in his mother; Hypertension in his father and mother; Lung disease in his father; Transient ischemic attack in his mother. Allergies  Allergen Reactions   Azithromycin  Other (See Comments)    Burning in tongue   Amlodipine  Other (See Comments)    Ankle edema   Current Outpatient Medications on File Prior to  Visit  Medication Sig Dispense Refill   ALOE-SODIUM CHLORIDE  NA Place 1 spray into the nose at bedtime. Saline Nasal Spray With Aloe     Cholecalciferol (VITAMIN D) 2000 UNITS tablet Take 2,000 Units by mouth daily.     Cyanocobalamin (VITAMIN B-12 PO) Take 1 tablet by mouth daily.     ELIQUIS  5 MG TABS tablet TAKE 1 TABLET BY MOUTH TWICE A DAY 60 tablet 5   finasteride (PROSCAR) 5 MG tablet Take 5 mg by mouth daily.     fluticasone -salmeterol (ADVAIR) 100-50 MCG/ACT AEPB Inhale 1 puff into the lungs 2 (two) times daily. 60 each 11   furosemide  (LASIX ) 20 MG tablet ALTERNATE BETWEEN TAKING 2 TABLETS AND 3 TABLETS BY MOUTH DAILY 135 tablet 1   furosemide  (LASIX ) 40 MG tablet Take 1 tab daily in morning 90 tablet 2   hydrALAZINE   (APRESOLINE ) 25 MG tablet TAKE 1 TABLET BY MOUTH THREE TIMES A DAY 270 tablet 1   losartan  (COZAAR ) 100 MG tablet TAKE 1 TABLET BY MOUTH EVERY DAY 90 tablet 1   Lutein 40 MG CAPS Take by mouth.     Sodium Chloride -Xylitol (XLEAR SINUS CARE SPRAY NA) Place 1-2 sprays into the nose daily as needed (sinus issues.).     tamsulosin  (FLOMAX ) 0.4 MG CAPS capsule Take 0.4 mg by mouth daily.     No current facility-administered medications on file prior to visit.        ROS:  All others reviewed and negative.  Objective        PE:  BP 126/78 (BP Location: Right Arm, Patient Position: Sitting, Cuff Size: Normal)   Pulse (!) 53   Temp 97.8 F (36.6 C) (Oral)   Ht 5' 5 (1.651 m)   Wt 145 lb (65.8 kg)   SpO2 98%   BMI 24.13 kg/m                 Constitutional: Pt appears in NAD               HENT: Head: NCAT.                Right Ear: External ear normal.                 Left Ear: External ear normal.                Eyes: . Pupils are equal, round, and reactive to light. Conjunctivae and EOM are normal               Nose: without d/c or deformity               Neck: Neck supple. Gross normal ROM               Cardiovascular: Normal rate and regular rhythm.                 Pulmonary/Chest: Effort normal and breath sounds without rales or wheezing.                Abd:  Soft, NT, ND, + BS, no organomegaly               Neurological: Pt is alert. At baseline orientation, motor grossly intact               Skin: Skin is warm. No rashes, no other new lesions, LE edema - none  Psychiatric: Pt behavior is normal without agitation , depressed affect  Micro: none  Cardiac tracings I have personally interpreted today:  none  Pertinent Radiological findings (summarize): none   Lab Results  Component Value Date   WBC 9.1 06/18/2023   HGB 11.9 (L) 06/18/2023   HCT 36.9 (L) 06/18/2023   PLT 289.0 06/18/2023   GLUCOSE 119 (H) 06/18/2023   GLUCOSE 119 (H) 06/18/2023   CHOL 140  06/18/2023   TRIG 124.0 06/18/2023   HDL 46.20 06/18/2023   LDLCALC 69 06/18/2023   ALT 26 06/18/2023   AST 32 06/18/2023   NA 142 06/18/2023   NA 142 06/18/2023   K 4.5 06/18/2023   K 4.5 06/18/2023   CL 103 06/18/2023   CL 103 06/18/2023   CREATININE 1.29 06/18/2023   CREATININE 1.29 06/18/2023   BUN 32 (H) 06/18/2023   BUN 32 (H) 06/18/2023   CO2 33 (H) 06/18/2023   CO2 33 (H) 06/18/2023   TSH 0.82 06/18/2023   PSA 2.11 08/27/2006   INR 1.02 03/31/2014   HGBA1C 6.6 (H) 06/18/2023   Assessment/Plan:  DIEZEL MAZUR is a 89 y.o. White or Caucasian [1] male with  has a past medical history of Arthritis, Basal cell cancer, Benign prostatic hypertrophy, Bronchitis, Chest pain (02/19/1990), Hyperglycemia, Hyperlipidemia, Hypertension, Pneumonia (02/20/2007), and Tachycardia.  (HFpEF) heart failure with preserved ejection fraction (HCC) Volume stable today, pt to continue current med tx  Prediabetes Lab Results  Component Value Date   HGBA1C 6.6 (H) 06/18/2023   Stable, pt to continue current medical treatment  - diet, wt control   Insomnia With mild worsening, for increased seroquel  to 50 mg at bedtime prn  Depression Mild to mod, for citalopram 10 mg every day, to f/u any worsening symptoms or concerns  Decubitus ulcer of left buttock, stage 1 Mild early shallow, for duoderm, also HH with wound care (and RN and PT),  to f/u any worsening symptoms or concerns   Urinary incontinence Also for UA today  Followup: Return in about 4 weeks (around 09/18/2023).  Lynwood Norleen, MD 08/21/2023 12:59 PM [facility]

## 2023-08-21 NOTE — Assessment & Plan Note (Signed)
 Volume stable today, pt to continue current med tx

## 2023-08-22 LAB — URINE CULTURE: Result:: NO GROWTH

## 2023-08-25 ENCOUNTER — Ambulatory Visit: Payer: Self-pay | Admitting: Internal Medicine

## 2023-09-23 ENCOUNTER — Ambulatory Visit: Admitting: Physician Assistant

## 2023-10-08 ENCOUNTER — Other Ambulatory Visit: Payer: Self-pay | Admitting: Internal Medicine

## 2023-10-08 ENCOUNTER — Encounter: Payer: Self-pay | Admitting: Internal Medicine

## 2023-10-10 ENCOUNTER — Other Ambulatory Visit: Payer: Self-pay | Admitting: Internal Medicine

## 2023-10-10 MED ORDER — ADVAIR DISKUS 100-50 MCG/ACT IN AEPB: INHALATION_SPRAY | RESPIRATORY_TRACT | 11 refills | Status: DC

## 2023-10-17 MED ORDER — BUDESONIDE-FORMOTEROL FUMARATE 160-4.5 MCG/ACT IN AERO: 2.0000 | INHALATION_SPRAY | Freq: Two times a day (BID) | RESPIRATORY_TRACT | 3 refills | Status: AC

## 2023-10-22 ENCOUNTER — Encounter: Payer: Self-pay | Admitting: Internal Medicine

## 2023-10-22 DIAGNOSIS — I4819 Other persistent atrial fibrillation: Secondary | ICD-10-CM

## 2023-10-22 DIAGNOSIS — I5032 Chronic diastolic (congestive) heart failure: Secondary | ICD-10-CM

## 2023-10-22 DIAGNOSIS — R413 Other amnesia: Secondary | ICD-10-CM

## 2023-10-25 NOTE — Telephone Encounter (Signed)
 Copied from CRM [id number]. Topic: Referral - Status >> Oct 25, 2023  9:49 AM Dedra B wrote: Reason for CRM: [facility] from [facility] called to inform Dr. Geofm that they received the referral for palliative care and they will be following. If nay questions, Nidia can be reached at [phone].

## 2023-10-30 NOTE — Telephone Encounter (Unsigned)
 Copied from CRM [id number]. Topic: General - Other >> Oct 30, 2023  3:30 PM Chiquita SQUIBB wrote: Reason for CRM: Odella from [facility] is calling in for a hospice care order for the patient. She would also like to know if Dr. Geofm will follow his care moving forward. Please fax it too [phone] and if any questions her call back number is [phone]

## 2023-10-31 ENCOUNTER — Telehealth: Payer: Self-pay

## 2023-10-31 NOTE — Telephone Encounter (Signed)
 Spoke with [facility] today

## 2023-10-31 NOTE — Telephone Encounter (Signed)
 Copied from CRM #[id number]. Topic: Referral - Question >> Oct 31, 2023 10:30 AM Maisie BROCKS wrote: Reason for CRM: Charmaine from [facility] stated that the patient is inquiring if pcp is willing to be the attending provider for him while he's on hospice care and manage his symptoms. Call back number is [phone].

## 2023-11-04 ENCOUNTER — Telehealth: Payer: Self-pay

## 2023-11-11 NOTE — Telephone Encounter (Signed)
 Addressed.

## 2023-12-17 ENCOUNTER — Other Ambulatory Visit: Payer: Self-pay | Admitting: Internal Medicine

## 2024-06-29 ENCOUNTER — Ambulatory Visit
